# Patient Record
Sex: Female | Born: 1970 | Race: White | Hispanic: No | Marital: Single | Smoking: Never smoker
Health system: Southern US, Community
[De-identification: ages and names within clinical notes are randomized; demographics above are authoritative.]

## PROBLEM LIST (undated history)

## (undated) DIAGNOSIS — G43909 Migraine, unspecified, not intractable, without status migrainosus: Secondary | ICD-10-CM

## (undated) DIAGNOSIS — G5603 Carpal tunnel syndrome, bilateral upper limbs: Secondary | ICD-10-CM

## (undated) DIAGNOSIS — D219 Benign neoplasm of connective and other soft tissue, unspecified: Secondary | ICD-10-CM

## (undated) DIAGNOSIS — L732 Hidradenitis suppurativa: Secondary | ICD-10-CM

## (undated) DIAGNOSIS — N939 Abnormal uterine and vaginal bleeding, unspecified: Secondary | ICD-10-CM

## (undated) DIAGNOSIS — F329 Major depressive disorder, single episode, unspecified: Secondary | ICD-10-CM

## (undated) DIAGNOSIS — R7303 Prediabetes: Secondary | ICD-10-CM

## (undated) DIAGNOSIS — Z889 Allergy status to unspecified drugs, medicaments and biological substances status: Secondary | ICD-10-CM

## (undated) DIAGNOSIS — F209 Schizophrenia, unspecified: Secondary | ICD-10-CM

## (undated) DIAGNOSIS — F32A Depression, unspecified: Secondary | ICD-10-CM

## (undated) DIAGNOSIS — D649 Anemia, unspecified: Secondary | ICD-10-CM

## (undated) DIAGNOSIS — F70 Mild intellectual disabilities: Secondary | ICD-10-CM

## (undated) DIAGNOSIS — C50919 Malignant neoplasm of unspecified site of unspecified female breast: Secondary | ICD-10-CM

## (undated) DIAGNOSIS — I10 Essential (primary) hypertension: Secondary | ICD-10-CM

## (undated) DIAGNOSIS — Z923 Personal history of irradiation: Secondary | ICD-10-CM

## (undated) DIAGNOSIS — F819 Developmental disorder of scholastic skills, unspecified: Secondary | ICD-10-CM

## (undated) DIAGNOSIS — D509 Iron deficiency anemia, unspecified: Secondary | ICD-10-CM

## (undated) DIAGNOSIS — E781 Pure hyperglyceridemia: Secondary | ICD-10-CM

## (undated) DIAGNOSIS — K219 Gastro-esophageal reflux disease without esophagitis: Secondary | ICD-10-CM

## (undated) DIAGNOSIS — N83201 Unspecified ovarian cyst, right side: Secondary | ICD-10-CM

## (undated) DIAGNOSIS — R569 Unspecified convulsions: Secondary | ICD-10-CM

## (undated) DIAGNOSIS — R600 Localized edema: Secondary | ICD-10-CM

## (undated) HISTORY — DX: Migraine, unspecified, not intractable, without status migrainosus: G43.909

## (undated) HISTORY — PX: ABDOMINAL HYSTERECTOMY: SHX81

## (undated) HISTORY — DX: Major depressive disorder, single episode, unspecified: F32.9

## (undated) HISTORY — PX: AXILLARY HIDRADENITIS EXCISION: SUR522

## (undated) HISTORY — PX: INGUINAL HIDRADENITIS EXCISION: SHX1827

## (undated) HISTORY — PX: CARPAL TUNNEL RELEASE: SHX101

## (undated) HISTORY — DX: Depression, unspecified: F32.A

## (undated) HISTORY — PX: CYSTOSCOPY: SUR368

---

## 2004-08-13 ENCOUNTER — Ambulatory Visit: Payer: Self-pay | Admitting: Family Medicine

## 2004-08-15 ENCOUNTER — Ambulatory Visit: Payer: Self-pay | Admitting: Family Medicine

## 2005-08-19 ENCOUNTER — Emergency Department: Payer: Self-pay | Admitting: Emergency Medicine

## 2005-10-08 ENCOUNTER — Encounter (HOSPITAL_BASED_OUTPATIENT_CLINIC_OR_DEPARTMENT_OTHER): Admission: RE | Admit: 2005-10-08 | Discharge: 2006-01-06 | Payer: Self-pay | Admitting: Internal Medicine

## 2010-02-05 ENCOUNTER — Emergency Department: Payer: Self-pay | Admitting: Emergency Medicine

## 2010-10-30 ENCOUNTER — Ambulatory Visit: Payer: Self-pay | Admitting: Family Medicine

## 2011-11-08 ENCOUNTER — Ambulatory Visit: Payer: Self-pay | Admitting: Family Medicine

## 2012-11-10 ENCOUNTER — Ambulatory Visit: Payer: Self-pay | Admitting: Family Medicine

## 2013-11-11 ENCOUNTER — Ambulatory Visit: Payer: Self-pay | Admitting: Family Medicine

## 2014-08-15 ENCOUNTER — Ambulatory Visit: Payer: Self-pay | Admitting: Otolaryngology

## 2014-09-06 DIAGNOSIS — R42 Dizziness and giddiness: Secondary | ICD-10-CM | POA: Insufficient documentation

## 2014-10-17 ENCOUNTER — Other Ambulatory Visit: Payer: Self-pay | Admitting: Family Medicine

## 2014-10-17 DIAGNOSIS — Z1231 Encounter for screening mammogram for malignant neoplasm of breast: Secondary | ICD-10-CM

## 2014-11-15 ENCOUNTER — Ambulatory Visit: Payer: Self-pay

## 2014-11-23 ENCOUNTER — Ambulatory Visit
Admission: RE | Admit: 2014-11-23 | Discharge: 2014-11-23 | Disposition: A | Discharge status: Home / Self Care | Payer: Medicare Other | Source: Ambulatory Visit | Attending: Family Medicine | Admitting: Family Medicine

## 2014-11-23 ENCOUNTER — Other Ambulatory Visit: Payer: Self-pay | Admitting: Family Medicine

## 2014-11-23 DIAGNOSIS — Z1231 Encounter for screening mammogram for malignant neoplasm of breast: Secondary | ICD-10-CM

## 2014-11-23 DIAGNOSIS — N6452 Nipple discharge: Secondary | ICD-10-CM

## 2014-11-28 ENCOUNTER — Ambulatory Visit
Admission: RE | Admit: 2014-11-28 | Discharge: 2014-11-28 | Disposition: A | Discharge status: Home / Self Care | Payer: Medicare Other | Source: Ambulatory Visit | Attending: Family Medicine | Admitting: Family Medicine

## 2014-11-28 ENCOUNTER — Ambulatory Visit: Payer: Medicare Other

## 2014-11-28 DIAGNOSIS — N6452 Nipple discharge: Secondary | ICD-10-CM

## 2014-11-28 DIAGNOSIS — Z1231 Encounter for screening mammogram for malignant neoplasm of breast: Secondary | ICD-10-CM

## 2014-12-02 ENCOUNTER — Other Ambulatory Visit: Payer: Medicare Other

## 2014-12-02 ENCOUNTER — Ambulatory Visit: Payer: Medicare Other

## 2014-12-02 ENCOUNTER — Other Ambulatory Visit: Payer: Self-pay | Admitting: Family Medicine

## 2014-12-02 DIAGNOSIS — N6452 Nipple discharge: Secondary | ICD-10-CM

## 2014-12-06 ENCOUNTER — Other Ambulatory Visit: Payer: Self-pay | Admitting: Family Medicine

## 2014-12-06 DIAGNOSIS — N6452 Nipple discharge: Secondary | ICD-10-CM

## 2014-12-06 DIAGNOSIS — R928 Other abnormal and inconclusive findings on diagnostic imaging of breast: Secondary | ICD-10-CM

## 2014-12-12 ENCOUNTER — Ambulatory Visit
Admission: RE | Admit: 2014-12-12 | Discharge: 2014-12-12 | Disposition: A | Discharge status: Home / Self Care | Payer: Medicare Other | Source: Ambulatory Visit | Attending: Family Medicine | Admitting: Family Medicine

## 2014-12-12 DIAGNOSIS — N6452 Nipple discharge: Secondary | ICD-10-CM

## 2014-12-12 DIAGNOSIS — R928 Other abnormal and inconclusive findings on diagnostic imaging of breast: Secondary | ICD-10-CM

## 2015-01-12 ENCOUNTER — Encounter: Payer: Self-pay | Admitting: General Surgery

## 2015-01-16 DIAGNOSIS — C50919 Malignant neoplasm of unspecified site of unspecified female breast: Secondary | ICD-10-CM

## 2015-01-16 HISTORY — DX: Malignant neoplasm of unspecified site of unspecified female breast: C50.919

## 2015-01-17 ENCOUNTER — Other Ambulatory Visit: Payer: Self-pay | Admitting: General Surgery

## 2015-01-17 ENCOUNTER — Encounter: Payer: Self-pay | Admitting: General Surgery

## 2015-01-17 ENCOUNTER — Ambulatory Visit (INDEPENDENT_AMBULATORY_CARE_PROVIDER_SITE_OTHER): Payer: Medicare Other | Admitting: General Surgery

## 2015-01-17 ENCOUNTER — Other Ambulatory Visit: Payer: Medicare Other

## 2015-01-17 VITALS — BP 122/74 | HR 78 | Resp 12 | Ht 62.0 in | Wt 186.0 lb

## 2015-01-17 DIAGNOSIS — N6452 Nipple discharge: Secondary | ICD-10-CM

## 2015-01-17 DIAGNOSIS — Z803 Family history of malignant neoplasm of breast: Secondary | ICD-10-CM

## 2015-01-17 NOTE — Patient Instructions (Signed)
Patient is scheduled for surgery at [facility] on 01/26/15. She will pre admit by phone. Patient is aware of date and instructions.

## 2015-01-17 NOTE — Progress Notes (Signed)
Patient ID: Karen Beard, female   DOB: Jun 26, 1970, 44 y.o.   MRN: [id number]  Chief Complaint  Patient presents with  . Other    mammogram    HPI Karen Beard is a 44 y.o. female who presents for a breast evaluation. The most recent mammogram and right breast ulrasound was done on 12/12/14. She staes she noticed right nipple discharge about 2 months ago brownish green tint.  Patient does perform regular self breast checks and gets regular mammograms done. Sister just found in November 2015 with breast cancer at age 42, Genetic test was performed on her and was negative.   HPI  Past Medical History  Diagnosis Date  . Depression   . Migraine     Past Surgical History  Procedure Laterality Date  . Axillary hidradenitis excision      unc    Family History  Problem Relation Age of Onset  . Breast cancer Sister 32  . Testicular cancer Father     Social History History  Substance Use Topics  . Smoking status: Never Smoker   . Smokeless tobacco: Not on file  . Alcohol Use: No    Allergies  Allergen Reactions  . Cephalexin Nausea And Vomiting  . Corn Oil Other (See Comments)  . Peanut Oil Other (See Comments)  . Phenobarbital Other (See Comments)  . Sumatriptan Other (See Comments)  . Vilazodone Hcl Nausea Only  . Duloxetine Hcl Anxiety    agitation    Current Outpatient Prescriptions  Medication Sig Dispense Refill  . ranitidine (ZANTAC) 75 MG tablet Take 75 mg by mouth as needed for heartburn.     No current facility-administered medications for this visit.    Review of Systems Review of Systems  Constitutional: Negative.   Respiratory: Negative.   Cardiovascular: Negative.     Blood pressure 122/74, pulse 78, resp. rate 12, height 5\' 2"  (1.575 m), weight 186 lb (84.369 kg), last menstrual period 12/25/2014.  Physical Exam Physical Exam  Constitutional: She is oriented to person, place, and time. She appears well-developed and well-nourished.   Eyes: Conjunctivae are normal. No scleral icterus.  Neck: Neck supple.  Cardiovascular: Normal rate, regular rhythm and normal heart sounds.   Pulmonary/Chest: Effort normal and breath sounds normal. Right breast exhibits nipple discharge. Right breast exhibits no inverted nipple, no mass, no skin change and no tenderness. Left breast exhibits nipple discharge. Left breast exhibits no inverted nipple, no mass, no skin change and no tenderness.  Bilateral reddish brown nipple discharge.  Right side with copious amounts. Appears to be coming through central portion of the nipple with pressure from all areas of subareolar tissue.  Left side drainage is minimal and appears to be coming from the 10-11 oclock position.   Abdominal: Soft. Bowel sounds are normal. There is no tenderness.  Lymphadenopathy:    She has no cervical adenopathy.    She has no axillary adenopathy.  Neurological: She is alert and oriented to person, place, and time.  Skin: Skin is warm and dry.    Data Reviewed Mammogram and R breast US reviewed. Both normal.  L breast US was preformed today. At 12 oclock location subareolar there is an irregularly shaped hypoechoic mass, 8.4 mm in size.   Assessment    Bilateral bloody nipple discharge. Family history of breast cancer. Negative imaging on right. Suspicious area on left on mammogram.     Plan    Discussed excision of bilateral ducts with patient. Procedure and  risks explained in full. Patient is agreeable.  Patient is scheduled for surgery at [facility] on 01/26/15. She will pre admit by phone. Patient is aware of date and instructions.        Yamileth Hayse G 01/17/2015, 3:55 PM

## 2015-01-19 ENCOUNTER — Telehealth: Payer: Self-pay | Admitting: *Deleted

## 2015-01-19 NOTE — Telephone Encounter (Signed)
Notified patient as instructed, patient pleased. Discussed follow-up appointments, patient agrees  

## 2015-01-19 NOTE — Telephone Encounter (Signed)
-----   Message from Christene Lye, MD sent at 01/19/2015  3:57 PM EDT ----- Please let pt pt know the cytology showed no suspicious findingl.

## 2015-01-20 ENCOUNTER — Encounter: Payer: Self-pay | Admitting: *Deleted

## 2015-01-20 ENCOUNTER — Inpatient Hospital Stay: Admission: RE | Admit: 2015-01-20 | Payer: Medicare Other | Source: Ambulatory Visit

## 2015-01-20 DIAGNOSIS — G43909 Migraine, unspecified, not intractable, without status migrainosus: Secondary | ICD-10-CM | POA: Diagnosis not present

## 2015-01-20 DIAGNOSIS — Z888 Allergy status to other drugs, medicaments and biological substances status: Secondary | ICD-10-CM | POA: Diagnosis not present

## 2015-01-20 DIAGNOSIS — I10 Essential (primary) hypertension: Secondary | ICD-10-CM | POA: Diagnosis not present

## 2015-01-20 DIAGNOSIS — D1801 Hemangioma of skin and subcutaneous tissue: Secondary | ICD-10-CM | POA: Diagnosis not present

## 2015-01-20 DIAGNOSIS — K219 Gastro-esophageal reflux disease without esophagitis: Secondary | ICD-10-CM | POA: Diagnosis not present

## 2015-01-20 DIAGNOSIS — N6452 Nipple discharge: Secondary | ICD-10-CM | POA: Diagnosis present

## 2015-01-20 DIAGNOSIS — M7989 Other specified soft tissue disorders: Secondary | ICD-10-CM | POA: Diagnosis not present

## 2015-01-20 DIAGNOSIS — Z91010 Allergy to peanuts: Secondary | ICD-10-CM | POA: Diagnosis not present

## 2015-01-20 DIAGNOSIS — F329 Major depressive disorder, single episode, unspecified: Secondary | ICD-10-CM | POA: Diagnosis not present

## 2015-01-20 DIAGNOSIS — Z79899 Other long term (current) drug therapy: Secondary | ICD-10-CM | POA: Diagnosis not present

## 2015-01-20 DIAGNOSIS — Z803 Family history of malignant neoplasm of breast: Secondary | ICD-10-CM | POA: Diagnosis not present

## 2015-01-20 DIAGNOSIS — N6021 Fibroadenosis of right breast: Secondary | ICD-10-CM | POA: Diagnosis not present

## 2015-01-20 DIAGNOSIS — Z91018 Allergy to other foods: Secondary | ICD-10-CM | POA: Diagnosis not present

## 2015-01-20 DIAGNOSIS — Z8043 Family history of malignant neoplasm of testis: Secondary | ICD-10-CM | POA: Diagnosis not present

## 2015-01-20 DIAGNOSIS — D0511 Intraductal carcinoma in situ of right breast: Secondary | ICD-10-CM | POA: Diagnosis not present

## 2015-01-20 DIAGNOSIS — N6042 Mammary duct ectasia of left breast: Secondary | ICD-10-CM | POA: Diagnosis not present

## 2015-01-20 NOTE — Patient Instructions (Signed)
  Your procedure is scheduled on: 01-26-15 Report to [place] To find out your arrival time please call [phone] between 1PM - 3PM on 01-25-15 [facility])  Remember: Instructions that are not followed completely may result in serious medical risk, up to and including death, or upon the discretion of your surgeon and anesthesiologist your surgery may need to be rescheduled.    _X___ 1. Do not eat food or drink liquids after midnight. No gum chewing or hard candies.     _X___ 2. No Alcohol for 24 hours before or after surgery.   ____ 3. Bring all medications with you on the day of surgery if instructed.    _X___ 4. Notify your doctor if there is any change in your medical condition     (cold, fever, infections).     Do not wear jewelry, make-up, hairpins, clips or nail polish.  Do not wear lotions, powders, or perfumes. You may wear deodorant.  Do not shave 48 hours prior to surgery. Men may shave face and neck.  Do not bring valuables to the hospital.    [facility] is not responsible for any belongings or valuables.               Contacts, dentures or bridgework may not be worn into surgery.  Leave your suitcase in the car. After surgery it may be brought to your room.  For patients admitted to the hospital, discharge time is determined by your  treatment team.   Patients discharged the day of surgery will not be allowed to drive home.   Please read over the following fact sheets that you were given:     ____ Take these medicines the morning of surgery with A SIP OF WATER:    1. NONE  2.   3.   4.  5.  6.  ____ Fleet Enema (as directed)   ____ Use CHG Soap as directed  ____ Use inhalers on the day of surgery  ____ Stop metformin 2 days prior to surgery    ____ Take 1/2 of usual insulin dose the night before surgery and none on the morning of surgery.   _X___ Stop Coumadin/Plavix/aspirin-STOP EXCEDRIN MIGRAINE NOW  ____ Stop  Anti-inflammatories-NO NSAIDS OR ASPIRIN PRODUCTS-TYLENOL OK   ____ Stop supplements until after surgery.    ____ Bring C-Pap to the hospital.

## 2015-01-24 ENCOUNTER — Encounter
Admission: RE | Admit: 2015-01-24 | Discharge: 2015-01-24 | Disposition: A | Discharge status: Home / Self Care | Payer: Medicare Other | Source: Ambulatory Visit | Attending: Anesthesiology | Admitting: Anesthesiology

## 2015-01-24 DIAGNOSIS — D0511 Intraductal carcinoma in situ of right breast: Secondary | ICD-10-CM | POA: Diagnosis not present

## 2015-01-24 DIAGNOSIS — I10 Essential (primary) hypertension: Secondary | ICD-10-CM

## 2015-01-25 ENCOUNTER — Encounter: Payer: Self-pay | Admitting: *Deleted

## 2015-01-26 ENCOUNTER — Encounter
Admission: RE | Disposition: A | Discharge status: Home / Self Care | Payer: Self-pay | Source: Ambulatory Visit | Attending: General Surgery

## 2015-01-26 ENCOUNTER — Ambulatory Visit: Payer: Medicare Other | Admitting: Anesthesiology

## 2015-01-26 ENCOUNTER — Encounter: Payer: Self-pay | Admitting: *Deleted

## 2015-01-26 ENCOUNTER — Ambulatory Visit
Admission: RE | Admit: 2015-01-26 | Discharge: 2015-01-26 | Disposition: A | Discharge status: Home / Self Care | Payer: Medicare Other | Source: Ambulatory Visit | Attending: General Surgery | Admitting: General Surgery

## 2015-01-26 DIAGNOSIS — I10 Essential (primary) hypertension: Secondary | ICD-10-CM | POA: Insufficient documentation

## 2015-01-26 DIAGNOSIS — G43909 Migraine, unspecified, not intractable, without status migrainosus: Secondary | ICD-10-CM | POA: Insufficient documentation

## 2015-01-26 DIAGNOSIS — D0511 Intraductal carcinoma in situ of right breast: Secondary | ICD-10-CM | POA: Insufficient documentation

## 2015-01-26 DIAGNOSIS — F329 Major depressive disorder, single episode, unspecified: Secondary | ICD-10-CM | POA: Insufficient documentation

## 2015-01-26 DIAGNOSIS — Z8043 Family history of malignant neoplasm of testis: Secondary | ICD-10-CM | POA: Insufficient documentation

## 2015-01-26 DIAGNOSIS — N6452 Nipple discharge: Secondary | ICD-10-CM

## 2015-01-26 DIAGNOSIS — N6042 Mammary duct ectasia of left breast: Secondary | ICD-10-CM | POA: Insufficient documentation

## 2015-01-26 DIAGNOSIS — K219 Gastro-esophageal reflux disease without esophagitis: Secondary | ICD-10-CM | POA: Insufficient documentation

## 2015-01-26 DIAGNOSIS — Z803 Family history of malignant neoplasm of breast: Secondary | ICD-10-CM | POA: Insufficient documentation

## 2015-01-26 DIAGNOSIS — Z888 Allergy status to other drugs, medicaments and biological substances status: Secondary | ICD-10-CM | POA: Insufficient documentation

## 2015-01-26 DIAGNOSIS — Z79899 Other long term (current) drug therapy: Secondary | ICD-10-CM | POA: Insufficient documentation

## 2015-01-26 DIAGNOSIS — D1801 Hemangioma of skin and subcutaneous tissue: Secondary | ICD-10-CM | POA: Insufficient documentation

## 2015-01-26 DIAGNOSIS — M7989 Other specified soft tissue disorders: Secondary | ICD-10-CM | POA: Insufficient documentation

## 2015-01-26 DIAGNOSIS — Z91018 Allergy to other foods: Secondary | ICD-10-CM | POA: Insufficient documentation

## 2015-01-26 DIAGNOSIS — Z91010 Allergy to peanuts: Secondary | ICD-10-CM | POA: Insufficient documentation

## 2015-01-26 DIAGNOSIS — N6021 Fibroadenosis of right breast: Secondary | ICD-10-CM | POA: Insufficient documentation

## 2015-01-26 HISTORY — DX: Gastro-esophageal reflux disease without esophagitis: K21.9

## 2015-01-26 HISTORY — DX: Localized edema: R60.0

## 2015-01-26 HISTORY — PX: BREAST DUCTAL SYSTEM EXCISION: SHX5242

## 2015-01-26 HISTORY — DX: Unspecified convulsions: R56.9

## 2015-01-26 HISTORY — DX: Essential (primary) hypertension: I10

## 2015-01-26 HISTORY — PX: EXCISION / BIOPSY BREAST / NIPPLE / DUCT: SUR469

## 2015-01-26 HISTORY — DX: Developmental disorder of scholastic skills, unspecified: F81.9

## 2015-01-26 LAB — HCG, QUANTITATIVE, PREGNANCY: hCG, Beta Chain, Quant, S: 1 m[IU]/mL (ref <5)

## 2015-01-26 SURGERY — EXCISION DUCTAL SYSTEM BREAST
Anesthesia: General | Laterality: Bilateral | Wound class: Clean

## 2015-01-26 MED ORDER — CHLORHEXIDINE GLUCONATE 4 % EX LIQD
1.0000 "application " | Freq: Once | CUTANEOUS | Status: AC
  Administered 2015-01-26: 1 via TOPICAL

## 2015-01-26 MED ORDER — LACTATED RINGERS IV SOLN
INTRAVENOUS | Status: DC
  Administered 2015-01-26 (×2): via INTRAVENOUS

## 2015-01-26 MED ORDER — ONDANSETRON HCL 4 MG/2ML IJ SOLN: 4.0000 mg | Freq: Once | INTRAMUSCULAR | Status: DC | PRN

## 2015-01-26 MED ORDER — LIDOCAINE HCL (CARDIAC) 20 MG/ML IV SOLN
INTRAVENOUS | Status: DC | PRN
  Administered 2015-01-26: 100 mg via INTRAVENOUS

## 2015-01-26 MED ORDER — FAMOTIDINE 20 MG PO TABS
ORAL_TABLET | ORAL | Status: AC
  Administered 2015-01-26: 20 mg via ORAL
  Filled 2015-01-26: qty 1

## 2015-01-26 MED ORDER — FENTANYL CITRATE (PF) 100 MCG/2ML IJ SOLN: 25.0000 ug | INTRAMUSCULAR | Status: DC | PRN

## 2015-01-26 MED ORDER — DEXAMETHASONE SODIUM PHOSPHATE 4 MG/ML IJ SOLN
INTRAMUSCULAR | Status: DC | PRN
  Administered 2015-01-26: 5 mg via INTRAVENOUS

## 2015-01-26 MED ORDER — ONDANSETRON HCL 4 MG/2ML IJ SOLN
INTRAMUSCULAR | Status: DC | PRN
  Administered 2015-01-26: 4 mg via INTRAVENOUS

## 2015-01-26 MED ORDER — BUPIVACAINE HCL (PF) 0.5 % IJ SOLN
INTRAMUSCULAR | Status: AC
  Filled 2015-01-26: qty 30

## 2015-01-26 MED ORDER — PROPOFOL 10 MG/ML IV BOLUS
INTRAVENOUS | Status: DC | PRN
  Administered 2015-01-26: 160 mg via INTRAVENOUS

## 2015-01-26 MED ORDER — BUPIVACAINE HCL (PF) 0.5 % IJ SOLN
INTRAMUSCULAR | Status: DC | PRN
  Administered 2015-01-26: 13 mL

## 2015-01-26 MED ORDER — TRAMADOL HCL 50 MG PO TABS: 50.0000 mg | ORAL_TABLET | Freq: Four times a day (QID) | ORAL | Status: DC | PRN

## 2015-01-26 MED ORDER — ACETAMINOPHEN 10 MG/ML IV SOLN
INTRAVENOUS | Status: AC
  Filled 2015-01-26: qty 100

## 2015-01-26 MED ORDER — BUPIVACAINE HCL (PF) 0.5 % IJ SOLN
INTRAMUSCULAR | Status: DC | PRN
  Administered 2015-01-26: 6 mL

## 2015-01-26 MED ORDER — FAMOTIDINE 20 MG PO TABS
20.0000 mg | ORAL_TABLET | Freq: Once | ORAL | Status: AC
  Administered 2015-01-26: 20 mg via ORAL

## 2015-01-26 MED ORDER — FENTANYL CITRATE (PF) 100 MCG/2ML IJ SOLN
INTRAMUSCULAR | Status: DC | PRN
  Administered 2015-01-26 (×2): 50 ug via INTRAVENOUS

## 2015-01-26 MED ORDER — LIDOCAINE HCL (PF) 1 % IJ SOLN
INTRAMUSCULAR | Status: AC
  Filled 2015-01-26: qty 30

## 2015-01-26 SURGICAL SUPPLY — 34 items
BLADE SURG 15 STRL SS SAFETY (BLADE) ×2 IMPLANT
CANISTER SUCT 1200ML W/VALVE (MISCELLANEOUS) ×2 IMPLANT
CHLORAPREP W/TINT 26ML (MISCELLANEOUS) ×2 IMPLANT
CNTNR SPEC 2.5X3XGRAD LEK (MISCELLANEOUS) ×2
CONT SPEC 4OZ STER OR WHT (MISCELLANEOUS) ×2
CONT SPEC 4OZ STRL OR WHT (MISCELLANEOUS) ×2
CONTAINER SPEC 2.5X3XGRAD LEK (MISCELLANEOUS) ×1 IMPLANT
COVER PROBE FLX POLY STRL (MISCELLANEOUS) ×2 IMPLANT
DEVICE DUBIN SPECIMEN MAMMOGRA (MISCELLANEOUS) ×1 IMPLANT
DEVICE LOCALIZATION ULTRAWIRE (WIRE) IMPLANT
DEVICE ULTRAWIRE LOCAL 19X9 (WIRE) IMPLANT
DRAPE LAPAROTOMY 100X77 ABD (DRAPES) ×2 IMPLANT
GLOVE BIO SURGEON STRL SZ7 (GLOVE) ×1 IMPLANT
GOWN STRL REUS W/ TWL LRG LVL3 (GOWN DISPOSABLE) ×2 IMPLANT
GOWN STRL REUS W/TWL LRG LVL3 (GOWN DISPOSABLE) ×6
KIT RM TURNOVER STRD PROC AR (KITS) ×2 IMPLANT
LABEL OR SOLS (LABEL) ×2 IMPLANT
LIQUID BAND (GAUZE/BANDAGES/DRESSINGS) ×2 IMPLANT
MARGIN MAP 10MM (MISCELLANEOUS) ×2 IMPLANT
NDL HPO THNWL 1X22GA REG BVL (NEEDLE) ×1 IMPLANT
NDL SAFETY 22GX1.5 (NEEDLE) ×2 IMPLANT
NDL SAFETY 25GX1.5 (NEEDLE) ×2 IMPLANT
NEEDLE SAFETY 22GX1 (NEEDLE)
PACK BASIN MINOR ARMC (MISCELLANEOUS) ×2 IMPLANT
PAD GROUND ADULT SPLIT (MISCELLANEOUS) ×2 IMPLANT
SUT ETH BLK MONO 3 0 FS 1 12/B (SUTURE) ×2 IMPLANT
SUT VIC AB 2-0 CT1 (SUTURE) ×3 IMPLANT
SUT VIC AB 3-0 54X BRD REEL (SUTURE) ×1 IMPLANT
SUT VIC AB 3-0 BRD 54 (SUTURE) ×2
SUT VIC AB 4-0 FS2 27 (SUTURE) ×3 IMPLANT
SYR CONTROL 10ML (SYRINGE) ×2 IMPLANT
ULTRAWIRE LOCAL DEVICE 19X9 (WIRE)
ULTRAWIRE LOCALIZATION DEVICE (WIRE)
WATER STERILE IRR 1000ML POUR (IV SOLUTION) ×2 IMPLANT

## 2015-01-26 NOTE — Discharge Instructions (Addendum)

## 2015-01-26 NOTE — Anesthesia Procedure Notes (Signed)
Procedure Name: LMA Insertion Date/Time: 01/26/2015 9:11 AM Performed by: Justus Memory Pre-anesthesia Checklist: Patient identified, Emergency Drugs available, Suction available and Patient being monitored Patient Re-evaluated:Patient Re-evaluated prior to inductionOxygen Delivery Method: Circle system utilized Preoxygenation: Pre-oxygenation with 100% oxygen Intubation Type: IV induction Ventilation: Mask ventilation without difficulty LMA: LMA inserted LMA Size: 3.5 Number of attempts: 1 Placement Confirmation: breath sounds checked- equal and bilateral Dental Injury: Teeth and Oropharynx as per pre-operative assessment

## 2015-01-26 NOTE — H&P (View-Only) (Signed)
Patient ID: Karen Beard, female   DOB: 06-Sep-1970, 44 y.o.   MRN: [id number]  Chief Complaint  Patient presents with  . Other    mammogram    HPI Karen Beard is a 44 y.o. female who presents for a breast evaluation. The most recent mammogram and right breast ulrasound was done on 12/12/14. She staes she noticed right nipple discharge about 2 months ago brownish green tint.  Patient does perform regular self breast checks and gets regular mammograms done. Sister just found in November 2015 with breast cancer at age 66, Genetic test was performed on her and was negative.   HPI  Past Medical History  Diagnosis Date  . Depression   . Migraine     Past Surgical History  Procedure Laterality Date  . Axillary hidradenitis excision      unc    Family History  Problem Relation Age of Onset  . Breast cancer Sister 68  . Testicular cancer Father     Social History History  Substance Use Topics  . Smoking status: Never Smoker   . Smokeless tobacco: Not on file  . Alcohol Use: No    Allergies  Allergen Reactions  . Cephalexin Nausea And Vomiting  . Corn Oil Other (See Comments)  . Peanut Oil Other (See Comments)  . Phenobarbital Other (See Comments)  . Sumatriptan Other (See Comments)  . Vilazodone Hcl Nausea Only  . Duloxetine Hcl Anxiety    agitation    Current Outpatient Prescriptions  Medication Sig Dispense Refill  . ranitidine (ZANTAC) 75 MG tablet Take 75 mg by mouth as needed for heartburn.     No current facility-administered medications for this visit.    Review of Systems Review of Systems  Constitutional: Negative.   Respiratory: Negative.   Cardiovascular: Negative.     Blood pressure 122/74, pulse 78, resp. rate 12, height 5\' 2"  (1.575 m), weight 186 lb (84.369 kg), last menstrual period 12/25/2014.  Physical Exam Physical Exam  Constitutional: She is oriented to person, place, and time. She appears well-developed and well-nourished.   Eyes: Conjunctivae are normal. No scleral icterus.  Neck: Neck supple.  Cardiovascular: Normal rate, regular rhythm and normal heart sounds.   Pulmonary/Chest: Effort normal and breath sounds normal. Right breast exhibits nipple discharge. Right breast exhibits no inverted nipple, no mass, no skin change and no tenderness. Left breast exhibits nipple discharge. Left breast exhibits no inverted nipple, no mass, no skin change and no tenderness.  Bilateral reddish brown nipple discharge.  Right side with copious amounts. Appears to be coming through central portion of the nipple with pressure from all areas of subareolar tissue.  Left side drainage is minimal and appears to be coming from the 10-11 oclock position.   Abdominal: Soft. Bowel sounds are normal. There is no tenderness.  Lymphadenopathy:    She has no cervical adenopathy.    She has no axillary adenopathy.  Neurological: She is alert and oriented to person, place, and time.  Skin: Skin is warm and dry.    Data Reviewed Mammogram and R breast US reviewed. Both normal.  L breast US was preformed today. At 12 oclock location subareolar there is an irregularly shaped hypoechoic mass, 8.4 mm in size.   Assessment    Bilateral bloody nipple discharge. Family history of breast cancer. Negative imaging on right. Suspicious area on left on mammogram.     Plan    Discussed excision of bilateral ducts with patient. Procedure and  risks explained in full. Patient is agreeable.  Patient is scheduled for surgery at [facility] on 01/26/15. She will pre admit by phone. Patient is aware of date and instructions.        Karen Beard 01/17/2015, 3:55 PM

## 2015-01-26 NOTE — Op Note (Signed)
Preop diagnosis: Bilateral bloody nipple discharge  Post op diagnosis: Same  Operation: Bilateral subareolar duct excision  Surgeon: S.G.Circe Chilton  Assistant:     Anesthesia: Gen.  Complications: None  EBL: Minimal  Drains: None  Description: Patient was brought the operating room and put to sleep in supine position the operating table. Both breast areas were prepped and draped sterile field and timeout was performed. Patient had a significantly large amount of from bloody drainage from the right ear nipple which appear to be coming from the central location underneath the nipple. The left side had less of drainage and appear to be coming from an area around 11 to 12:00 location in the subareolar tissue. Right side was operated on first. Half percent Marcaine was instilled total of 6 mL for postop analgesia and a circumareolar incision was made on the inferior aspect from the medial to lateral aspect. Skin was elevated towards the nipple as also the skin and subcutaneous tissue freed on the inferior side ductal tissue containing this of dark bloody fluid was encountered beneath the nipple area- this was freed from the nipple and dissected out as far lateral as possible going more central and slightly inferior. This was excised out in full. Bleeding was controlled cautery. Tissue was sent in formalin for pathology. The deep tissue approximated with interrupted 2-0 Vicryl. Subcutaneous tissue also with the 2-0 Vicryl. Skin was closed with subcuticular 4-0 Vicryl. Left side was operated on now the ultrasound unit was brought up and there was a suspicion of ill-defined small mass in the 11th 12:00 position when this was done in the office setting this was not clearly evident on ultrasound today but there was suggestion of vaguely outlined heart. Mass occupying this location. Accordingly incision was mapped out along the superior aspect of the circumareolar region this incision was then made skin was  elevated as also the subcutaneous tissue superior the nipple area was freed and the ductal tissue containing the dark red fluid was encountered this was then followed distally towards the superior aspect and excised out fully. Bleeding was controlled cautery deep tissues were closed with  2-0 Vicryl. Skin approximated with subcuticular 4-0 Vicryl. Liquid ban applied on both incisions. Patient was subsequently extubated and returned recovery room stable condition

## 2015-01-26 NOTE — Transfer of Care (Signed)
Immediate Anesthesia Transfer of Care Note  Patient: Karen Beard  Procedure(s) Performed: Procedure(s): BILATERAL EXCISION DUCTAL SYSTEM BREAST (Bilateral)  Patient Location: PACU  Anesthesia Type:General  Level of Consciousness: awake, alert  and oriented  Airway & Oxygen Therapy: Patient connected to face mask oxygen  Post-op Assessment: Report given to RN and Post -op Vital signs reviewed and stable  Post vital signs: Reviewed and stable  Last Vitals:  Filed Vitals:   01/26/15 1027  BP: 123/79  Pulse: 86  Temp: 36.9 C  Resp: 13    Complications: No apparent anesthesia complications

## 2015-01-26 NOTE — Interval H&P Note (Signed)
History and Physical Interval Note:  01/26/2015 8:09 AM  Karen Beard  has presented today for surgery, with the diagnosis of bloody nipple discharge  The various methods of treatment have been discussed with the patient and family. After consideration of risks, benefits and other options for treatment, the patient has consented to  Procedure(s): [place] (Bilateral) as a surgical intervention .  The patient's history has been reviewed, patient examined, no change in status, stable for surgery.  I have reviewed the patient's chart and labs.  Questions were answered to the patient's satisfaction.     SANKAR,SEEPLAPUTHUR G

## 2015-01-26 NOTE — Anesthesia Preprocedure Evaluation (Signed)
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    GI/Hepatic Neg liver ROS, GERD-  ,Rare reflux   Endo/Other  negative endocrine ROS  Renal/GU negative Renal ROS  negative genitourinary   Musculoskeletal negative musculoskeletal ROS (+)   Abdominal Normal abdominal exam  (+)   Peds negative pediatric ROS (+) Learning disability   Hematology negative hematology ROS (+)   Anesthesia Other Findings   Reproductive/Obstetrics negative OB ROS                             Anesthesia Physical Anesthesia Plan  ASA: II  Anesthesia Plan: General   Post-op Pain Management:    Induction: Intravenous  Airway Management Planned: LMA  Additional Equipment:   Intra-op Plan:   Post-operative Plan: Extubation in OR  Informed Consent: I have reviewed the patients History and Physical, chart, labs and discussed the procedure including the risks, benefits and alternatives for the proposed anesthesia with the patient or authorized representative who has indicated his/her understanding and acceptance.   Dental advisory given  Plan Discussed with: CRNA and Surgeon  Anesthesia Plan Comments:         Anesthesia Quick Evaluation

## 2015-01-26 NOTE — Anesthesia Postprocedure Evaluation (Signed)
  Anesthesia Post-op Note  Patient: Karen Beard  Procedure(s) Performed: Procedure(s): BILATERAL EXCISION DUCTAL SYSTEM BREAST (Bilateral)  Anesthesia type:General  Patient location: PACU  Post pain: Pain level controlled  Post assessment: Post-op Vital signs reviewed, Patient's Cardiovascular Status Stable, Respiratory Function Stable, Patent Airway and No signs of Nausea or vomiting  Post vital signs: Reviewed and stable  Last Vitals:  Filed Vitals:   01/26/15 1044  BP: 142/88  Pulse: 85  Temp:   Resp: 15    Level of consciousness: awake, alert  and patient cooperative  Complications: No apparent anesthesia complications

## 2015-01-27 DIAGNOSIS — D0511 Intraductal carcinoma in situ of right breast: Secondary | ICD-10-CM | POA: Diagnosis not present

## 2015-01-31 ENCOUNTER — Telehealth: Payer: Self-pay | Admitting: General Surgery

## 2015-01-31 LAB — SURGICAL PATHOLOGY

## 2015-01-31 NOTE — Telephone Encounter (Signed)
PT'S MOTHER CALLED (FRANCES Schloss) TO OBTAIN RESULTS FROM  SX 01-26-15.THEY ARE VERY VERY ANXIOUS. PT HAS A SCH'D APPT  FOR 02-01-15 @ 4:30PM. PLEASE CALL/MTH

## 2015-02-01 ENCOUNTER — Encounter: Payer: Self-pay | Admitting: General Surgery

## 2015-02-01 ENCOUNTER — Ambulatory Visit (INDEPENDENT_AMBULATORY_CARE_PROVIDER_SITE_OTHER): Payer: Medicare Other | Admitting: General Surgery

## 2015-02-01 VITALS — BP 148/88 | HR 82 | Resp 16 | Ht 62.0 in | Wt 185.0 lb

## 2015-02-01 DIAGNOSIS — D0511 Intraductal carcinoma in situ of right breast: Secondary | ICD-10-CM

## 2015-02-01 DIAGNOSIS — N6452 Nipple discharge: Secondary | ICD-10-CM

## 2015-02-01 NOTE — Progress Notes (Signed)
Patient ID: Karen Beard, female   DOB: 1971/01/24, 44 y.o.   MRN: [id number] Patient is here for a post op follow up for a bilateral ductal excision done on 01/26/15. She reports some mild discomfort. She has not had to take any prescription pain medications for this. She reports no drainage.   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 Margins were involved in different areas. Left breast tissue was negative for any malignancy. Discussed fully additional lumpectomy of right breast with removal of nipple areolar complex and subsequent radiation therapy with patient and family. Also gave option of mastectomy in place of lumpectomy/radiation. Patient is agreeable to have a lumpectomy and understands this will involve removal of nipple and areola. ER/PR are pending Patient's surgery has been scheduled for 02-06-15 at [facility].   PCP: [place] Pink

## 2015-02-01 NOTE — Patient Instructions (Addendum)
Patient's surgery has been scheduled for 02-06-15 at [facility].

## 2015-02-06 ENCOUNTER — Encounter
Admission: RE | Disposition: A | Discharge status: Home / Self Care | Payer: Self-pay | Source: Ambulatory Visit | Attending: General Surgery

## 2015-02-06 ENCOUNTER — Ambulatory Visit: Payer: Medicare Other | Admitting: Anesthesiology

## 2015-02-06 ENCOUNTER — Encounter (HOSPITAL_BASED_OUTPATIENT_CLINIC_OR_DEPARTMENT_OTHER): Payer: Medicare Other | Admitting: General Surgery

## 2015-02-06 ENCOUNTER — Encounter: Payer: Self-pay | Admitting: *Deleted

## 2015-02-06 ENCOUNTER — Ambulatory Visit
Admission: RE | Admit: 2015-02-06 | Discharge: 2015-02-06 | Disposition: A | Discharge status: Home / Self Care | Payer: Medicare Other | Source: Ambulatory Visit | Attending: General Surgery | Admitting: General Surgery

## 2015-02-06 DIAGNOSIS — D0511 Intraductal carcinoma in situ of right breast: Secondary | ICD-10-CM | POA: Diagnosis present

## 2015-02-06 DIAGNOSIS — I10 Essential (primary) hypertension: Secondary | ICD-10-CM | POA: Insufficient documentation

## 2015-02-06 DIAGNOSIS — F819 Developmental disorder of scholastic skills, unspecified: Secondary | ICD-10-CM | POA: Insufficient documentation

## 2015-02-06 HISTORY — PX: BREAST LUMPECTOMY: SHX2

## 2015-02-06 LAB — POCT PREGNANCY, URINE: PREG TEST UR: NEGATIVE

## 2015-02-06 SURGERY — BREAST LUMPECTOMY
Anesthesia: General | Laterality: Right | Wound class: Clean

## 2015-02-06 MED ORDER — FAMOTIDINE 20 MG PO TABS
ORAL_TABLET | ORAL | Status: AC
  Administered 2015-02-06: 20 mg via ORAL
  Filled 2015-02-06: qty 1

## 2015-02-06 MED ORDER — ACETAMINOPHEN 10 MG/ML IV SOLN
INTRAVENOUS | Status: DC | PRN
  Administered 2015-02-06: 1000 mg via INTRAVENOUS

## 2015-02-06 MED ORDER — FENTANYL CITRATE (PF) 100 MCG/2ML IJ SOLN
INTRAMUSCULAR | Status: DC | PRN
  Administered 2015-02-06 (×2): 25 ug via INTRAVENOUS
  Administered 2015-02-06: 50 ug via INTRAVENOUS

## 2015-02-06 MED ORDER — ACETAMINOPHEN 10 MG/ML IV SOLN
INTRAVENOUS | Status: AC
  Filled 2015-02-06: qty 100

## 2015-02-06 MED ORDER — KETOROLAC TROMETHAMINE 30 MG/ML IJ SOLN
INTRAMUSCULAR | Status: DC | PRN
  Administered 2015-02-06: 30 mg via INTRAVENOUS

## 2015-02-06 MED ORDER — CIPROFLOXACIN IN D5W 400 MG/200ML IV SOLN
INTRAVENOUS | Status: AC
Start: 2015-02-06 — End: 2015-02-06
  Administered 2015-02-06: 400 mg via INTRAVENOUS
  Filled 2015-02-06: qty 200

## 2015-02-06 MED ORDER — LIDOCAINE HCL (CARDIAC) 20 MG/ML IV SOLN
INTRAVENOUS | Status: DC | PRN
  Administered 2015-02-06: 100 mg via INTRAVENOUS

## 2015-02-06 MED ORDER — SODIUM CHLORIDE 0.9 % IJ SOLN
INTRAMUSCULAR | Status: AC
  Filled 2015-02-06: qty 10

## 2015-02-06 MED ORDER — MIDAZOLAM HCL 2 MG/2ML IJ SOLN
INTRAMUSCULAR | Status: DC | PRN
  Administered 2015-02-06: 1 mg via INTRAVENOUS

## 2015-02-06 MED ORDER — PROMETHAZINE HCL 25 MG/ML IJ SOLN: 6.2500 mg | INTRAMUSCULAR | Status: DC | PRN

## 2015-02-06 MED ORDER — CIPROFLOXACIN IN D5W 400 MG/200ML IV SOLN
400.0000 mg | INTRAVENOUS | Status: AC
  Administered 2015-02-06: 400 mg via INTRAVENOUS

## 2015-02-06 MED ORDER — KETAMINE HCL 50 MG/ML IJ SOLN
INTRAMUSCULAR | Status: DC | PRN
  Administered 2015-02-06: 25 mg via INTRAMUSCULAR

## 2015-02-06 MED ORDER — BUPIVACAINE HCL (PF) 0.5 % IJ SOLN
INTRAMUSCULAR | Status: AC
  Filled 2015-02-06: qty 30

## 2015-02-06 MED ORDER — DEXAMETHASONE SODIUM PHOSPHATE 4 MG/ML IJ SOLN
INTRAMUSCULAR | Status: DC | PRN
  Administered 2015-02-06: 5 mg via INTRAVENOUS

## 2015-02-06 MED ORDER — LACTATED RINGERS IV SOLN
INTRAVENOUS | Status: DC
  Administered 2015-02-06: 09:00:00 via INTRAVENOUS

## 2015-02-06 MED ORDER — GLYCOPYRROLATE 0.2 MG/ML IJ SOLN
INTRAMUSCULAR | Status: DC | PRN
  Administered 2015-02-06: 0.2 mg via INTRAVENOUS

## 2015-02-06 MED ORDER — ONDANSETRON HCL 4 MG/2ML IJ SOLN
INTRAMUSCULAR | Status: DC | PRN
Start: 2015-02-06 — End: 2015-02-06
  Administered 2015-02-06: 4 mg via INTRAVENOUS

## 2015-02-06 MED ORDER — FAMOTIDINE 20 MG PO TABS
20.0000 mg | ORAL_TABLET | Freq: Once | ORAL | Status: AC
  Administered 2015-02-06: 20 mg via ORAL

## 2015-02-06 MED ORDER — METHYLENE BLUE 1 % INJ SOLN
INTRAMUSCULAR | Status: AC
  Filled 2015-02-06: qty 10

## 2015-02-06 MED ORDER — BUPIVACAINE HCL (PF) 0.5 % IJ SOLN
INTRAMUSCULAR | Status: DC | PRN
  Administered 2015-02-06: 10 mL

## 2015-02-06 MED ORDER — PROPOFOL 10 MG/ML IV BOLUS
INTRAVENOUS | Status: DC | PRN
  Administered 2015-02-06: 150 mg via INTRAVENOUS

## 2015-02-06 MED ORDER — FENTANYL CITRATE (PF) 100 MCG/2ML IJ SOLN: 25.0000 ug | INTRAMUSCULAR | Status: DC | PRN

## 2015-02-06 SURGICAL SUPPLY — 32 items
BLADE SURG 15 STRL SS SAFETY (BLADE) ×3 IMPLANT
BULB RESERV EVAC DRAIN JP 100C (MISCELLANEOUS) ×1 IMPLANT
CANISTER SUCT 1200ML W/VALVE (MISCELLANEOUS) ×2 IMPLANT
CHLORAPREP W/TINT 26ML (MISCELLANEOUS) ×2 IMPLANT
CNTNR SPEC 2.5X3XGRAD LEK (MISCELLANEOUS) ×1
CONT SPEC 4OZ STER OR WHT (MISCELLANEOUS) ×1
CONT SPEC 4OZ STRL OR WHT (MISCELLANEOUS) ×1
CONTAINER SPEC 2.5X3XGRAD LEK (MISCELLANEOUS) ×4 IMPLANT
COVER PROBE FLX POLY STRL (MISCELLANEOUS) ×1 IMPLANT
DEVICE LOCALIZATION ULTRAWIRE (WIRE) ×1 IMPLANT
DRAIN CHANNEL JP 15F RND 16 (MISCELLANEOUS) ×1 IMPLANT
DRAPE LAPAROTOMY TRNSV 106X77 (MISCELLANEOUS) ×2 IMPLANT
GLOVE BIO SURGEON STRL SZ7 (GLOVE) ×4 IMPLANT
GOWN STRL REUS W/ TWL LRG LVL3 (GOWN DISPOSABLE) ×3 IMPLANT
GOWN STRL REUS W/TWL LRG LVL3 (GOWN DISPOSABLE) ×4
HARMONIC SCALPEL FOCUS (MISCELLANEOUS) ×1 IMPLANT
KIT RM TURNOVER STRD PROC AR (KITS) ×2 IMPLANT
LABEL OR SOLS (LABEL) ×1 IMPLANT
LIQUID BAND (GAUZE/BANDAGES/DRESSINGS) ×2 IMPLANT
MARGIN MAP 10MM (MISCELLANEOUS) ×1 IMPLANT
NDL SAFETY 22GX1.5 (NEEDLE) ×2 IMPLANT
NDL SAFETY 25GX1.5 (NEEDLE) ×1 IMPLANT
PACK BASIN MINOR ARMC (MISCELLANEOUS) ×2 IMPLANT
PAD GROUND ADULT SPLIT (MISCELLANEOUS) ×2 IMPLANT
STRIP CLOSURE SKIN 1/2X4 (GAUZE/BANDAGES/DRESSINGS) ×1 IMPLANT
SUT ETH BLK MONO 3 0 FS 1 12/B (SUTURE) ×3 IMPLANT
SUT MNCRL AB 3-0 PS2 27 (SUTURE) ×3 IMPLANT
SUT VIC AB 2-0 BRD 54 (SUTURE) ×2 IMPLANT
SUT VIC AB 2-0 CT2 27 (SUTURE) ×3 IMPLANT
SYRINGE 10CC LL (SYRINGE) ×2 IMPLANT
ULTRAWIRE LOCALIZATION DEVICE (WIRE)
WATER STERILE IRR 1000ML POUR (IV SOLUTION) ×2 IMPLANT

## 2015-02-06 NOTE — Transfer of Care (Signed)
Immediate Anesthesia Transfer of Care Note  Patient: Karen Beard  Procedure(s) Performed: Procedure(s): BREAST LUMPECTOMY (Right)  Patient Location: PACU  Anesthesia Type:General  Level of Consciousness: sedated  Airway & Oxygen Therapy: Patient Spontanous Breathing and Patient connected to nasal cannula oxygen  Post-op Assessment: Report given to RN and Post -op Vital signs reviewed and stable  Post vital signs: Reviewed and stable  Last Vitals:  Filed Vitals:   02/06/15 1144  BP: 131/76  Pulse: 98  Temp: 36.2 C  Resp: 15    Complications: No apparent anesthesia complications

## 2015-02-06 NOTE — Discharge Instructions (Signed)

## 2015-02-06 NOTE — Anesthesia Preprocedure Evaluation (Signed)
Anesthesia Evaluation  Patient identified by MRN, date of birth, ID band Patient awake    Reviewed: Allergy & Precautions, NPO status , Patient's Chart, lab work & pertinent test results  Airway Mallampati: II  TM Distance: >3 FB Neck ROM: Full    Dental  (+) Chipped, Poor Dentition   Pulmonary neg pulmonary ROS,  breath sounds clear to auscultation  Pulmonary exam normal       Cardiovascular hypertension, Normal cardiovascular exam Leg swelling   Neuro/Psych  Headaches, Seizures -,  No seizures since childhood    GI/Hepatic Neg liver ROS, GERD-  ,Rare reflux   Endo/Other  negative endocrine ROS  Renal/GU negative Renal ROS  negative genitourinary   Musculoskeletal negative musculoskeletal ROS (+)   Abdominal Normal abdominal exam  (+)   Peds negative pediatric ROS (+) Learning disability   Hematology negative hematology ROS (+)   Anesthesia Other Findings   Reproductive/Obstetrics negative OB ROS                             Anesthesia Physical  Anesthesia Plan  ASA: II  Anesthesia Plan: General   Post-op Pain Management:    Induction: Intravenous  Airway Management Planned: LMA  Additional Equipment:   Intra-op Plan:   Post-operative Plan: Extubation in OR  Informed Consent: I have reviewed the patients History and Physical, chart, labs and discussed the procedure including the risks, benefits and alternatives for the proposed anesthesia with the patient or authorized representative who has indicated his/her understanding and acceptance.   Dental advisory given  Plan Discussed with: CRNA and Surgeon  Anesthesia Plan Comments:         Anesthesia Quick Evaluation

## 2015-02-06 NOTE — Interval H&P Note (Signed)
History and Physical Interval Note:  02/06/2015 10:30 AM  Karen Beard  has presented today for surgery, with the diagnosis of DCIS RIGHT BREAST  The various methods of treatment have been discussed with the patient and family. After consideration of risks, benefits and other options for treatment, the patient has consented to  Procedure(s): BREAST LUMPECTOMY (Right) as a surgical intervention .  The patient's history has been reviewed, patient examined, no change in status, stable for surgery.  I have reviewed the patient's chart and labs.  Questions were answered to the patient's satisfaction.     SANKAR,SEEPLAPUTHUR G

## 2015-02-06 NOTE — Anesthesia Procedure Notes (Signed)
Procedure Name: LMA Insertion Date/Time: 02/06/2015 10:54 AM Performed by: Rosaria Ferries, Kiondre Grenz Pre-anesthesia Checklist: Patient identified, Emergency Drugs available, Suction available and Patient being monitored Patient Re-evaluated:Patient Re-evaluated prior to inductionOxygen Delivery Method: Circle system utilized Preoxygenation: Pre-oxygenation with 100% oxygen Intubation Type: IV induction LMA: LMA inserted LMA Size: 4.0 Number of attempts: 1 Placement Confirmation: breath sounds checked- equal and bilateral

## 2015-02-06 NOTE — Op Note (Signed)
Preop diagnosis: DCIS right breast  Post op diagnosis: Same  Operation: Right breast lumpectomy  Surgeon: S.G.Sankar  Assistant:     Anesthesia: Gen.  Complications: None  EBL: Minimal  Drains: None  Description: Patient was put to sleep in supine position the operating table in the right breast was prepped and draped as sterile field. Timeout was performed. This patient had the recent the subareolar duct excision for bloody nipple drainage. Pathology revealed DCIS fairly extensive involving the margins. Patient also had the small amount of nipple drainage on the left and biopsy there was also completed showing no evidence of DCIS. Patient had elected for a lumpectomy which   necessitated removal of the nipple areolar complex. Accordingly an elliptical incision was made surrounding the nipple the nipple areolar complex in a transverse orientation the skin and subcutaneous tissue with an elevated on both sides. The subareolar tissue along with the nipple area complex overlying this was then excised out completely allowing for some additional margin beyond the previous excision cavity -the excised tissue was tagged for margins and sent to pathology. Hemostasis was obtained easily with cautery. The glandular tissue and the subcutaneous tissue was then closed with interrupted 2-0 Vicryl. Skin was closed with subcuticular 3-0 Monocryl and then covered with the liqui  ban. About 20 mL of half percent Marcaine was instilled for postop analgesia prior to closure of the wound.

## 2015-02-06 NOTE — H&P (View-Only) (Signed)
Patient ID: Karen Beard, female   DOB: 1971-05-14, 44 y.o.   MRN: [id number] Patient is here for a post op follow up for a bilateral ductal excision done on 01/26/15. She reports some mild discomfort. She has not had to take any prescription pain medications for this. She reports no drainage.   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 Margins were involved in different areas. Left breast tissue was negative for any malignancy. Discussed fully additional lumpectomy of right breast with removal of nipple areolar complex and subsequent radiation therapy with patient and family. Also gave option of mastectomy in place of lumpectomy/radiation. Patient is agreeable to have a lumpectomy and understands this will involve removal of nipple and areola. ER/PR are pending Patient's surgery has been scheduled for 02-06-15 at [facility].   PCP: [place] Pink

## 2015-02-08 NOTE — Progress Notes (Signed)
Patient ID: Karen Beard, female   DOB: [birth year], 44 y.o.   MRN: [id number] Phoned patient to introduce navigation service.  Fed-Exed Breast Cancer Treatment Handbook/folder with hospital services.  Patient has post surgical visit with Dr. Jamal Collin 02/14/15.  States he is planning to refer to radiation Oncologist.  Plan to meet at that visit. Oncology Nurse Navigator Documentation  Oncology Nurse Navigator Flowsheets 02/08/2015  Navigator Encounter Type Introductory phone call  Patient Visit Type Initial  Treatment Phase Other  Barriers/Navigation Needs Family concerns  Time Spent with Patient 15

## 2015-02-09 NOTE — Anesthesia Postprocedure Evaluation (Signed)
  Anesthesia Post-op Note  Patient: Karen Beard  Procedure(s) Performed: Procedure(s): BREAST LUMPECTOMY (Right)  Anesthesia type:General  Patient location: PACU  Post pain: Pain level controlled  Post assessment: Post-op Vital signs reviewed, Patient's Cardiovascular Status Stable, Respiratory Function Stable, Patent Airway and No signs of Nausea or vomiting  Post vital signs: Reviewed and stable  Last Vitals:  Filed Vitals:   02/06/15 1322  BP: 142/80  Pulse:   Temp: 36.2 C  Resp: 16    Level of consciousness: awake, alert  and patient cooperative  Complications: No apparent anesthesia complications

## 2015-02-14 ENCOUNTER — Encounter: Payer: Self-pay | Admitting: General Surgery

## 2015-02-14 ENCOUNTER — Ambulatory Visit (INDEPENDENT_AMBULATORY_CARE_PROVIDER_SITE_OTHER): Payer: Medicare Other | Admitting: General Surgery

## 2015-02-14 VITALS — BP 120/78 | HR 68 | Resp 14 | Ht 62.0 in | Wt 185.0 lb

## 2015-02-14 DIAGNOSIS — C50911 Malignant neoplasm of unspecified site of right female breast: Secondary | ICD-10-CM

## 2015-02-14 DIAGNOSIS — D0511 Intraductal carcinoma in situ of right breast: Secondary | ICD-10-CM

## 2015-02-14 NOTE — Progress Notes (Signed)
Patient ID: Karen Beard, female   DOB: 09/30/1970, 44 y.o.   MRN: [id number]  Chief Complaint  Patient presents with  . Follow-up    Right Breast Lumpectomy    HPI Karen Beard is a 44 y.o. female.  Patient is here for 1 week follow up for her right breast lumpectomy done on 02/06/2015. Patient stated that she has not had any pain but has had some swelling on her bilateral legs for 1 week. She is here today with her mother.  HPI  Past Medical History  Diagnosis Date  . Depression   . Migraine   . GERD (gastroesophageal reflux disease)   . Seizures     AS A CHILD-NONE SINCE  . Edema of both legs   . Learning disability   . Hypertension     RECENTLY TAKEN OFF OF MEDS PER MOM   . Cancer 01-26-15    INVASIVE MAMMARY CARCINOMA and DCIS    Past Surgical History  Procedure Laterality Date  . Axillary hidradenitis excision      unc  . Inguinal hidradenitis excision    . Carpal tunnel release    . Breast ductal system excision Bilateral 01/26/2015    Procedure: BILATERAL EXCISION DUCTAL SYSTEM BREAST;  Surgeon: Seeplaputhur G Sankar, MD;  Location: [facility] ORS;  Service: General;  Laterality: Bilateral;  . Breast lumpectomy Right 02/06/2015    Procedure: BREAST LUMPECTOMY;  Surgeon: Seeplaputhur G Sankar, MD;  Location: [facility] ORS;  Service: General;  Laterality: Right;    Family History  Problem Relation Age of Onset  . Breast cancer Sister 50  . Testicular cancer Father     Social History Social History  Substance Use Topics  . Smoking status: Never Smoker   . Smokeless tobacco: Never Used  . Alcohol Use: No    Allergies  Allergen Reactions  . Cephalexin Nausea And Vomiting  . Chocolate Swelling  . Corn Oil Other (See Comments)  . Peanut Oil Other (See Comments)  . Phenobarbital Other (See Comments)  . Sumatriptan Other (See Comments)  . Vilazodone Hcl Nausea Only  . Duloxetine Hcl Anxiety    agitation  . Tape Rash    Current Outpatient Prescriptions   Medication Sig Dispense Refill  . acetaminophen (TYLENOL) 500 MG tablet Take 500 mg by mouth as needed.    . aspirin-acetaminophen-caffeine (EXCEDRIN MIGRAINE) 250-250-65 MG per tablet Take 1 tablet by mouth every 6 (six) hours as needed for headache.    . aspirin-sod bicarb-citric acid (ALKA-SELTZER) 325 MG TBEF tablet Take 325 mg by mouth every 6 (six) hours as needed.    . calcium carbonate (TUMS - DOSED IN MG ELEMENTAL CALCIUM) 500 MG chewable tablet Chew 1 tablet by mouth daily.    . cetirizine (ZYRTEC) 10 MG tablet Take 10 mg by mouth daily as needed for allergies.    . ranitidine (ZANTAC) 75 MG tablet Take 75 mg by mouth as needed for heartburn.     No current facility-administered medications for this visit.    Review of Systems Review of Systems  Constitutional: Negative.   Respiratory: Negative.   Cardiovascular: Positive for leg swelling.    Blood pressure 120/78, pulse 68, resp. rate 14, height 5' 2" (1.575 m), weight 185 lb (83.915 kg), last menstrual period 01/18/2015.  Physical Exam Physical Exam  Constitutional: She is oriented to person, place, and time. She appears well-developed and well-nourished.  HENT:  Mouth/Throat: Oropharynx is clear and moist.  Eyes: Conjunctivae are normal. No   scleral icterus.  Neck: Neck supple.  Cardiovascular: Normal rate, regular rhythm and normal heart sounds.   Bilateral lower leg edema.  Pulmonary/Chest: Effort normal and breath sounds normal.  Incision healing right breast with bruising.   Abdominal: Soft.  Lymphadenopathy:    She has no cervical adenopathy.  Neurological: She is alert and oriented to person, place, and time.  Skin: Skin is warm and dry.  Psychiatric: Her behavior is normal.    Data Reviewed Progress notes and pathology- margins are clear bu there was a 85m focus of invasive CA. 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 Leg edema is not  new. No labs done in last 5 mos.    Plan   Pt was advised fully on pathology. Feel a sentinel node biopsy is indicated. Reasons, potential change in extent of treatment if node is involved were discussed fully. Discussed risk and benefits for right SN biopsy. Pt is agreeable. CBC  CA 27-29 MEt C    [facility]   PCP: JMaryland Pink MD   SJunie PanningG 02/14/2015, 4:04 PM

## 2015-02-14 NOTE — Patient Instructions (Addendum)
The patient is aware to call back for any questions or concerns.  Patient's surgery has been scheduled for 02-23-15 at [facility].

## 2015-02-15 ENCOUNTER — Encounter: Payer: Self-pay | Admitting: *Deleted

## 2015-02-15 ENCOUNTER — Other Ambulatory Visit: Payer: Self-pay | Admitting: *Deleted

## 2015-02-15 DIAGNOSIS — D0511 Intraductal carcinoma in situ of right breast: Secondary | ICD-10-CM

## 2015-02-15 LAB — COMPREHENSIVE METABOLIC PANEL
A/G RATIO: 1.8 (ref 1.1–2.5)
ALT: 7 IU/L (ref 0–32)
AST: 13 IU/L (ref 0–40)
Albumin: 3.5 g/dL (ref 3.5–5.5)
Alkaline Phosphatase: 50 IU/L (ref 39–117)
BUN/Creatinine Ratio: 17 (ref 9–23)
BUN: 13 mg/dL (ref 6–24)
Bilirubin Total: 0.4 mg/dL (ref 0.0–1.2)
CALCIUM: 8.3 mg/dL — AB (ref 8.7–10.2)
CO2: 23 mmol/L (ref 18–29)
Chloride: 104 mmol/L (ref 97–108)
Creatinine, Ser: 0.75 mg/dL (ref 0.57–1.00)
GFR, EST AFRICAN AMERICAN: 111 mL/min/{1.73_m2} (ref >59)
GFR, EST NON AFRICAN AMERICAN: 97 mL/min/{1.73_m2} (ref >59)
GLOBULIN, TOTAL: 1.9 g/dL (ref 1.5–4.5)
Glucose: 126 mg/dL — ABNORMAL HIGH (ref 65–99)
POTASSIUM: 4.3 mmol/L (ref 3.5–5.2)
SODIUM: 142 mmol/L (ref 134–144)
Total Protein: 5.4 g/dL — ABNORMAL LOW (ref 6.0–8.5)

## 2015-02-15 LAB — CBC WITH DIFFERENTIAL/PLATELET
BASOS: 1 %
Basophils Absolute: 0.1 10*3/uL (ref 0.0–0.2)
EOS (ABSOLUTE): 1.6 10*3/uL — ABNORMAL HIGH (ref 0.0–0.4)
EOS: 20 %
HEMATOCRIT: 28.4 % — AB (ref 34.0–46.6)
Hemoglobin: 8.5 g/dL — ABNORMAL LOW (ref 11.1–15.9)
IMMATURE GRANULOCYTES: 0 %
Immature Grans (Abs): 0 10*3/uL (ref 0.0–0.1)
LYMPHS ABS: 1.4 10*3/uL (ref 0.7–3.1)
Lymphs: 17 %
MCH: 20.4 pg — ABNORMAL LOW (ref 26.6–33.0)
MCHC: 29.9 g/dL — ABNORMAL LOW (ref 31.5–35.7)
MCV: 68 fL — AB (ref 79–97)
MONOS ABS: 0.6 10*3/uL (ref 0.1–0.9)
Monocytes: 7 %
NEUTROS ABS: 4.6 10*3/uL (ref 1.4–7.0)
NEUTROS PCT: 55 %
Platelets: 383 10*3/uL — ABNORMAL HIGH (ref 150–379)
RBC: 4.17 x10E6/uL (ref 3.77–5.28)
RDW: 16.2 % — AB (ref 12.3–15.4)
WBC: 8.2 10*3/uL (ref 3.4–10.8)

## 2015-02-15 LAB — CANCER ANTIGEN 27.29: CA 27.29: 33.8 U/mL (ref 0.0–38.6)

## 2015-02-15 NOTE — Progress Notes (Signed)
Patient ID: Karen Beard, female   DOB: [birth year], 44 y.o.   MRN: [id number]  Patient's surgery has been scheduled for 02-23-15 at [facility]. She will report to the radiology desk at 7:30 am day of surgery (per Anderson Malta in Scheduling).   Please note: This patient reports her last menstrual period was 02-14-15. The Scheduling Department has been provided with this updated information today.

## 2015-02-17 ENCOUNTER — Other Ambulatory Visit: Payer: Medicare Other

## 2015-02-17 NOTE — Patient Instructions (Signed)
  Your procedure is scheduled on: 02-23-15 Report to [place] 7:30 AM    Remember: Instructions that are not followed completely may result in serious medical risk, up to and including death, or upon the discretion of your surgeon and anesthesiologist your surgery may need to be rescheduled.    _X___ 1. Do not eat food or drink liquids after midnight. No gum chewing or hard candies.     ____ 2. No Alcohol for 24 hours before or after surgery.   ____ 3. Bring all medications with you on the day of surgery if instructed.    ____ 4. Notify your doctor if there is any change in your medical condition     (cold, fever, infections).     Do not wear jewelry, make-up, hairpins, clips or nail polish.  Do not wear lotions, powders, or perfumes. You may wear deodorant.  Do not shave 48 hours prior to surgery. Men may shave face and neck.  Do not bring valuables to the hospital.    [facility] is not responsible for any belongings or valuables.               Contacts, dentures or bridgework may not be worn into surgery.  Leave your suitcase in the car. After surgery it may be brought to your room.  For patients admitted to the hospital, discharge time is determined by your treatment team.   Patients discharged the day of surgery will not be allowed to drive home.   Please read over the following fact sheets that you were given:     ____ Take these medicines the morning of surgery with A SIP OF WATER:    1. NONE  2.   3.   4.  5.  6.  ____ Fleet Enema (as directed)   ____ Use CHG Soap as directed  ____ Use inhalers on the day of surgery  ____ Stop metformin 2 days prior to surgery    ____ Take 1/2 of usual insulin dose the night before surgery and none on the morning of surgery.   ____ Stop Coumadin/Plavix/aspirin-STOP ALKA-SELTZER NOW  ____ Stop Anti-inflammatories-NO NSAIDS OR ASA PRODUCTS-TYLENOL OK   ____ Stop supplements until after surgery.    ____  Bring C-Pap to the hospital.

## 2015-02-17 NOTE — Pre-Procedure Instructions (Signed)
CALLED CAROLYN AT DR Jacalyn Lefevre OFFICE TO MAKE SURE DR Jamal Collin WAS AWARE OF 8.5 HGB THAT HE HAD DRAWN ON 02-14-15. I TOLD HER I HAD DONE PHONE INTERIVEW WITH PTS MOM AND HER MOM SAID HOW EXTREMELY TIRED PT WAS. MESSAGE TO PASSED ALONG TO DR Jamal Collin.

## 2015-02-21 ENCOUNTER — Other Ambulatory Visit: Payer: Self-pay | Admitting: Adult Health

## 2015-02-21 DIAGNOSIS — M7989 Other specified soft tissue disorders: Secondary | ICD-10-CM

## 2015-02-23 ENCOUNTER — Ambulatory Visit
Admission: RE | Admit: 2015-02-23 | Discharge: 2015-02-23 | Disposition: A | Discharge status: Home / Self Care | Payer: Medicare Other | Source: Ambulatory Visit | Attending: General Surgery | Admitting: General Surgery

## 2015-02-23 ENCOUNTER — Ambulatory Visit: Payer: Medicare Other

## 2015-02-23 ENCOUNTER — Encounter (HOSPITAL_BASED_OUTPATIENT_CLINIC_OR_DEPARTMENT_OTHER): Payer: Medicare Other | Admitting: General Surgery

## 2015-02-23 ENCOUNTER — Encounter
Admission: RE | Disposition: A | Discharge status: Home / Self Care | Payer: Self-pay | Source: Ambulatory Visit | Attending: General Surgery

## 2015-02-23 ENCOUNTER — Encounter: Payer: Self-pay | Admitting: *Deleted

## 2015-02-23 ENCOUNTER — Ambulatory Visit: Payer: Medicare Other | Admitting: Anesthesiology

## 2015-02-23 DIAGNOSIS — F819 Developmental disorder of scholastic skills, unspecified: Secondary | ICD-10-CM | POA: Diagnosis not present

## 2015-02-23 DIAGNOSIS — Z888 Allergy status to other drugs, medicaments and biological substances status: Secondary | ICD-10-CM | POA: Insufficient documentation

## 2015-02-23 DIAGNOSIS — F329 Major depressive disorder, single episode, unspecified: Secondary | ICD-10-CM | POA: Insufficient documentation

## 2015-02-23 DIAGNOSIS — Z803 Family history of malignant neoplasm of breast: Secondary | ICD-10-CM | POA: Insufficient documentation

## 2015-02-23 DIAGNOSIS — G43909 Migraine, unspecified, not intractable, without status migrainosus: Secondary | ICD-10-CM | POA: Insufficient documentation

## 2015-02-23 DIAGNOSIS — Z91048 Other nonmedicinal substance allergy status: Secondary | ICD-10-CM | POA: Diagnosis not present

## 2015-02-23 DIAGNOSIS — Z7982 Long term (current) use of aspirin: Secondary | ICD-10-CM | POA: Diagnosis not present

## 2015-02-23 DIAGNOSIS — Z79899 Other long term (current) drug therapy: Secondary | ICD-10-CM | POA: Diagnosis not present

## 2015-02-23 DIAGNOSIS — K219 Gastro-esophageal reflux disease without esophagitis: Secondary | ICD-10-CM | POA: Insufficient documentation

## 2015-02-23 DIAGNOSIS — Z91018 Allergy to other foods: Secondary | ICD-10-CM | POA: Diagnosis not present

## 2015-02-23 DIAGNOSIS — Z170 Estrogen receptor positive status [ER+]: Secondary | ICD-10-CM | POA: Insufficient documentation

## 2015-02-23 DIAGNOSIS — C50111 Malignant neoplasm of central portion of right female breast: Secondary | ICD-10-CM | POA: Insufficient documentation

## 2015-02-23 DIAGNOSIS — Z8043 Family history of malignant neoplasm of testis: Secondary | ICD-10-CM | POA: Diagnosis not present

## 2015-02-23 DIAGNOSIS — C50911 Malignant neoplasm of unspecified site of right female breast: Secondary | ICD-10-CM | POA: Diagnosis not present

## 2015-02-23 DIAGNOSIS — I10 Essential (primary) hypertension: Secondary | ICD-10-CM | POA: Insufficient documentation

## 2015-02-23 DIAGNOSIS — R609 Edema, unspecified: Secondary | ICD-10-CM | POA: Insufficient documentation

## 2015-02-23 DIAGNOSIS — D0511 Intraductal carcinoma in situ of right breast: Secondary | ICD-10-CM

## 2015-02-23 HISTORY — PX: AXILLARY SENTINEL NODE BIOPSY: SHX5738

## 2015-02-23 LAB — POCT PREGNANCY, URINE: PREG TEST UR: NEGATIVE

## 2015-02-23 SURGERY — BIOPSY, LYMPH NODE, SENTINEL, AXILLARY
Anesthesia: General | Laterality: Right | Wound class: Clean

## 2015-02-23 MED ORDER — ONDANSETRON HCL 4 MG/2ML IJ SOLN: 4.0000 mg | Freq: Once | INTRAMUSCULAR | Status: DC | PRN

## 2015-02-23 MED ORDER — METHYLENE BLUE 1 % INJ SOLN
INTRAMUSCULAR | Status: AC
  Filled 2015-02-23: qty 10

## 2015-02-23 MED ORDER — NEOSTIGMINE METHYLSULFATE 10 MG/10ML IV SOLN
INTRAVENOUS | Status: DC | PRN
  Administered 2015-02-23: 3.5 mg via INTRAVENOUS

## 2015-02-23 MED ORDER — CEFAZOLIN SODIUM-DEXTROSE 2-3 GM-% IV SOLR
2.0000 g | INTRAVENOUS | Status: AC
  Administered 2015-02-23: 2 g via INTRAVENOUS

## 2015-02-23 MED ORDER — ACETAMINOPHEN 10 MG/ML IV SOLN
INTRAVENOUS | Status: AC
  Filled 2015-02-23: qty 100

## 2015-02-23 MED ORDER — DEXAMETHASONE SODIUM PHOSPHATE 4 MG/ML IJ SOLN
INTRAMUSCULAR | Status: DC | PRN
  Administered 2015-02-23: 5 mg via INTRAVENOUS

## 2015-02-23 MED ORDER — PROPOFOL 10 MG/ML IV BOLUS
INTRAVENOUS | Status: DC | PRN
  Administered 2015-02-23: 150 mg via INTRAVENOUS

## 2015-02-23 MED ORDER — LACTATED RINGERS IV SOLN
INTRAVENOUS | Status: DC
  Administered 2015-02-23 (×2): via INTRAVENOUS

## 2015-02-23 MED ORDER — LIDOCAINE HCL (CARDIAC) 20 MG/ML IV SOLN
INTRAVENOUS | Status: DC | PRN
  Administered 2015-02-23: 60 mg via INTRAVENOUS

## 2015-02-23 MED ORDER — TECHNETIUM TC 99M SULFUR COLLOID
1.0000 | Freq: Once | INTRAVENOUS | Status: DC | PRN
  Administered 2015-02-23: 0.949 via INTRAVENOUS
  Filled 2015-02-23: qty 1

## 2015-02-23 MED ORDER — FAMOTIDINE 20 MG PO TABS
ORAL_TABLET | ORAL | Status: AC
  Administered 2015-02-23: 20 mg via ORAL
  Filled 2015-02-23: qty 1

## 2015-02-23 MED ORDER — KETOROLAC TROMETHAMINE 30 MG/ML IJ SOLN
INTRAMUSCULAR | Status: DC | PRN
  Administered 2015-02-23: 30 mg via INTRAVENOUS

## 2015-02-23 MED ORDER — ONDANSETRON HCL 4 MG/2ML IJ SOLN
INTRAMUSCULAR | Status: DC | PRN
  Administered 2015-02-23: 4 mg via INTRAVENOUS

## 2015-02-23 MED ORDER — MIDAZOLAM HCL 2 MG/2ML IJ SOLN
INTRAMUSCULAR | Status: DC | PRN
  Administered 2015-02-23: 2 mg via INTRAVENOUS

## 2015-02-23 MED ORDER — FENTANYL CITRATE (PF) 100 MCG/2ML IJ SOLN: 25.0000 ug | INTRAMUSCULAR | Status: DC | PRN

## 2015-02-23 MED ORDER — SODIUM CHLORIDE 0.9 % IJ SOLN
INTRAMUSCULAR | Status: AC
  Filled 2015-02-23: qty 10

## 2015-02-23 MED ORDER — FAMOTIDINE 20 MG PO TABS
20.0000 mg | ORAL_TABLET | Freq: Once | ORAL | Status: AC
  Administered 2015-02-23: 20 mg via ORAL

## 2015-02-23 MED ORDER — FENTANYL CITRATE (PF) 100 MCG/2ML IJ SOLN
INTRAMUSCULAR | Status: DC | PRN
  Administered 2015-02-23: 50 ug via INTRAVENOUS

## 2015-02-23 MED ORDER — ROCURONIUM BROMIDE 100 MG/10ML IV SOLN
INTRAVENOUS | Status: DC | PRN
  Administered 2015-02-23: 40 mg via INTRAVENOUS

## 2015-02-23 MED ORDER — GLYCOPYRROLATE 0.2 MG/ML IJ SOLN
INTRAMUSCULAR | Status: DC | PRN
  Administered 2015-02-23: 0.6 mg via INTRAVENOUS

## 2015-02-23 MED ORDER — CEFAZOLIN SODIUM-DEXTROSE 2-3 GM-% IV SOLR
INTRAVENOUS | Status: AC
  Administered 2015-02-23: 2 g via INTRAVENOUS
  Filled 2015-02-23: qty 50

## 2015-02-23 MED ORDER — ACETAMINOPHEN 10 MG/ML IV SOLN
INTRAVENOUS | Status: DC | PRN
  Administered 2015-02-23: 1000 mg via INTRAVENOUS

## 2015-02-23 MED ORDER — SODIUM CHLORIDE 0.9 % IJ SOLN
INTRAMUSCULAR | Status: DC | PRN
  Administered 2015-02-23: 5 mL via INTRAMUSCULAR

## 2015-02-23 MED ORDER — CHLORHEXIDINE GLUCONATE 4 % EX LIQD: 1.0000 "application " | Freq: Once | CUTANEOUS | Status: DC

## 2015-02-23 SURGICAL SUPPLY — 34 items
APPLIER CLIP 11 MED OPEN (CLIP)
APR CLP MED 11 20 MLT OPN (CLIP)
BLADE SURG 15 STRL SS SAFETY (BLADE) ×2 IMPLANT
BULB RESERV EVAC DRAIN JP 100C (MISCELLANEOUS) ×1 IMPLANT
CANISTER SUCT 1200ML W/VALVE (MISCELLANEOUS) ×2 IMPLANT
CHLORAPREP W/TINT 26ML (MISCELLANEOUS) ×2 IMPLANT
CLIP APPLIE 11 MED OPEN (CLIP) ×1 IMPLANT
CNTNR SPEC C3OZ STD GRAD LEK (MISCELLANEOUS) IMPLANT
CONT SPEC 3OZ W/LID STRL (MISCELLANEOUS) ×4
DRAIN CHANNEL JP 15F RND 16 (MISCELLANEOUS) ×1 IMPLANT
GLOVE BIO SURGEON STRL SZ7 (GLOVE) ×2 IMPLANT
GOWN STRL REUS W/ TWL LRG LVL3 (GOWN DISPOSABLE) ×2 IMPLANT
GOWN STRL REUS W/TWL LRG LVL3 (GOWN DISPOSABLE) ×4
KIT RM TURNOVER STRD PROC AR (KITS) ×2 IMPLANT
LABEL OR SOLS (LABEL) ×1 IMPLANT
LIQUID BAND (GAUZE/BANDAGES/DRESSINGS) ×2 IMPLANT
MARGIN MAP 10MM (MISCELLANEOUS) ×1 IMPLANT
NDL HPO THNWL 1X22GA REG BVL (NEEDLE) ×1 IMPLANT
NDL HYPO 25X1 1.5 SAFETY (NEEDLE) ×1 IMPLANT
NEEDLE HYPO 25X1 1.5 SAFETY (NEEDLE) ×2 IMPLANT
NEEDLE SAFETY 22GX1 (NEEDLE) ×2
PACK BASIN MINOR ARMC (MISCELLANEOUS) ×2 IMPLANT
PAD GROUND ADULT SPLIT (MISCELLANEOUS) ×2 IMPLANT
SLEVE PROBE SENORX GAMMA FIND (MISCELLANEOUS) ×2 IMPLANT
SPONGE LAP 18X18 5 PK (GAUZE/BANDAGES/DRESSINGS) ×2 IMPLANT
SUT MNCRL AB 3-0 PS2 27 (SUTURE) ×1 IMPLANT
SUT VIC AB 2-0 SH 27 (SUTURE) ×2
SUT VIC AB 2-0 SH 27XBRD (SUTURE) ×1 IMPLANT
SUT VIC AB 3-0 54X BRD REEL (SUTURE) ×1 IMPLANT
SUT VIC AB 3-0 BRD 54 (SUTURE) ×2
SUT VIC AB 3-0 SH 27 (SUTURE) ×2
SUT VIC AB 3-0 SH 27X BRD (SUTURE) ×1 IMPLANT
SYR CONTROL 10ML (SYRINGE) ×1 IMPLANT
WATER STERILE IRR 1000ML POUR (IV SOLUTION) ×2 IMPLANT

## 2015-02-23 NOTE — Anesthesia Postprocedure Evaluation (Signed)
  Anesthesia Post-op Note  Patient: Karen Beard  Procedure(s) Performed: Procedure(s): AXILLARY SENTINEL NODE BIOPSY (Right)  Anesthesia type:General  Patient location: PACU  Post pain: Pain level controlled  Post assessment: Post-op Vital signs reviewed, Patient's Cardiovascular Status Stable, Respiratory Function Stable, Patent Airway and No signs of Nausea or vomiting  Post vital signs: Reviewed and stable  Last Vitals:  Filed Vitals:   02/23/15 1252  BP: 128/68  Pulse: 69  Temp:   Resp: 18    Level of consciousness: awake, alert  and patient cooperative  Complications: No apparent anesthesia complications

## 2015-02-23 NOTE — Op Note (Signed)
Preop diagnosis: Carcinoma right breast central location  Post op diagnosis: Same  Operation: Right axillary sentinel node biopsy  Surgeon: S.G.Carylon Tamburro  Assistant:     Anesthesia: Gen.  Complications: None  EBL: Less than 20 mL  Drains: None  Description: Patient underwent nuclear contrast injection preoperatively. The patient was then put to sleep in supine position the operating table. With the Gamma finder that was only limited activity noted in the inferior portion of the axilla in the anterior location. To facilitate sentinel node identification 5 mL of a 50% diluted methylene blue was also injected in the central right breast. Right axilla was then prepped and draped as sterile field. A transverse incision was made in the inferior portion of the axilla from   below the anterior fold towards the posterior fold this was then dissected down U with the cautery down to the axillary fat pad. Identification of sentinel node was then established with the use of the Gamma finder and 4 separate nodes were identified with signal activity and to them with blue dye. All 4 were separately identified and sent for pathology which on immediate assessment showed no evidence of metastatic disease. After ensuring hemostasis the deep tissues were closed in 2 layers of 2-0 Vicryl. Skin closed with subcuticular 3-0 Monocryl coated with liquid ban. Procedure was well-tolerated and she was returned recovery room in stable condition.

## 2015-02-23 NOTE — Anesthesia Preprocedure Evaluation (Addendum)
Anesthesia Evaluation  Patient identified by MRN, date of birth, ID band Patient awake    Reviewed: Allergy & Precautions, NPO status , Patient's Chart, lab work & pertinent test results  Airway Mallampati: II  TM Distance: >3 FB     Dental  (+) Chipped, Poor Dentition   Pulmonary neg pulmonary ROS,    Pulmonary exam normal breath sounds clear to auscultation       Cardiovascular hypertension, Normal cardiovascular exam     Neuro/Psych  Headaches, Seizures -, Well Controlled,  Depression Sz as a child, none since    GI/Hepatic Neg liver ROS, GERD  Medicated and Controlled,  Endo/Other  negative endocrine ROS  Renal/GU negative Renal ROS     Musculoskeletal   Abdominal Normal abdominal exam  (+)   Peds negative pediatric ROS (+)  Hematology negative hematology ROS (+)   Anesthesia Other Findings   Reproductive/Obstetrics negative OB ROS                            Anesthesia Physical Anesthesia Plan  ASA: II  Anesthesia Plan: General   Post-op Pain Management:    Induction: Intravenous  Airway Management Planned: Oral ETT  Additional Equipment:   Intra-op Plan:   Post-operative Plan: Extubation in OR  Informed Consent: I have reviewed the patients History and Physical, chart, labs and discussed the procedure including the risks, benefits and alternatives for the proposed anesthesia with the patient or authorized representative who has indicated his/her understanding and acceptance.   Dental advisory given  Plan Discussed with: CRNA and Surgeon  Anesthesia Plan Comments:         Anesthesia Quick Evaluation

## 2015-02-23 NOTE — Anesthesia Procedure Notes (Signed)
Procedure Name: Intubation Date/Time: 02/23/2015 10:35 AM Performed by: Justus Memory Pre-anesthesia Checklist: Patient identified, Emergency Drugs available, Suction available and Patient being monitored Patient Re-evaluated:Patient Re-evaluated prior to inductionOxygen Delivery Method: Circle system utilized Preoxygenation: Pre-oxygenation with 100% oxygen Intubation Type: IV induction Ventilation: Mask ventilation without difficulty Laryngoscope Size: Mac and 3 Grade View: Grade I Tube type: Oral Number of attempts: 1 Airway Equipment and Method: Stylet Placement Confirmation: ETT inserted through vocal cords under direct vision,  positive ETCO2,  CO2 detector and breath sounds checked- equal and bilateral Secured at: 21 cm Tube secured with: Tape Dental Injury: Teeth and Oropharynx as per pre-operative assessment

## 2015-02-23 NOTE — Discharge Instructions (Signed)

## 2015-02-23 NOTE — Transfer of Care (Signed)
Immediate Anesthesia Transfer of Care Note  Patient: Karen Beard  Procedure(s) Performed: Procedure(s): AXILLARY SENTINEL NODE BIOPSY (Right)  Patient Location: PACU  Anesthesia Type:General  Level of Consciousness: awake, alert  and oriented  Airway & Oxygen Therapy: Patient Spontanous Breathing and Patient connected to nasal cannula oxygen  Post-op Assessment: Report given to RN and Post -op Vital signs reviewed and stable  Post vital signs: Reviewed and stable  Last Vitals:  Filed Vitals:   02/23/15 0828  BP: 142/82  Pulse: 83  Temp: 36.6 C  Resp: 16    Complications: No apparent anesthesia complications

## 2015-02-23 NOTE — H&P (View-Only) (Signed)
Patient ID: Karen Beard, female   DOB: 05/19/1971, 44 y.o.   MRN: [id number]  Chief Complaint  Patient presents with  . Follow-up    Right Breast Lumpectomy    HPI Karen Beard is a 44 y.o. female.  Patient is here for 1 week follow up for her right breast lumpectomy done on 02/06/2015. Patient stated that she has not had any pain but has had some swelling on her bilateral legs for 1 week. She is here today with her mother.  HPI  Past Medical History  Diagnosis Date  . Depression   . Migraine   . GERD (gastroesophageal reflux disease)   . Seizures     AS A CHILD-NONE SINCE  . Edema of both legs   . Learning disability   . Hypertension     RECENTLY TAKEN OFF OF MEDS PER MOM   . Cancer 01-26-15    INVASIVE MAMMARY CARCINOMA and DCIS    Past Surgical History  Procedure Laterality Date  . Axillary hidradenitis excision      unc  . Inguinal hidradenitis excision    . Carpal tunnel release    . Breast ductal system excision Bilateral 01/26/2015    Procedure: BILATERAL EXCISION DUCTAL SYSTEM BREAST;  Surgeon: Seeplaputhur G Sankar, MD;  Location: [facility] ORS;  Service: General;  Laterality: Bilateral;  . Breast lumpectomy Right 02/06/2015    Procedure: BREAST LUMPECTOMY;  Surgeon: Seeplaputhur G Sankar, MD;  Location: [facility] ORS;  Service: General;  Laterality: Right;    Family History  Problem Relation Age of Onset  . Breast cancer Sister 50  . Testicular cancer Father     Social History Social History  Substance Use Topics  . Smoking status: Never Smoker   . Smokeless tobacco: Never Used  . Alcohol Use: No    Allergies  Allergen Reactions  . Cephalexin Nausea And Vomiting  . Chocolate Swelling  . Corn Oil Other (See Comments)  . Peanut Oil Other (See Comments)  . Phenobarbital Other (See Comments)  . Sumatriptan Other (See Comments)  . Vilazodone Hcl Nausea Only  . Duloxetine Hcl Anxiety    agitation  . Tape Rash    Current Outpatient Prescriptions   Medication Sig Dispense Refill  . acetaminophen (TYLENOL) 500 MG tablet Take 500 mg by mouth as needed.    . aspirin-acetaminophen-caffeine (EXCEDRIN MIGRAINE) 250-250-65 MG per tablet Take 1 tablet by mouth every 6 (six) hours as needed for headache.    . aspirin-sod bicarb-citric acid (ALKA-SELTZER) 325 MG TBEF tablet Take 325 mg by mouth every 6 (six) hours as needed.    . calcium carbonate (TUMS - DOSED IN MG ELEMENTAL CALCIUM) 500 MG chewable tablet Chew 1 tablet by mouth daily.    . cetirizine (ZYRTEC) 10 MG tablet Take 10 mg by mouth daily as needed for allergies.    . ranitidine (ZANTAC) 75 MG tablet Take 75 mg by mouth as needed for heartburn.     No current facility-administered medications for this visit.    Review of Systems Review of Systems  Constitutional: Negative.   Respiratory: Negative.   Cardiovascular: Positive for leg swelling.    Blood pressure 120/78, pulse 68, resp. rate 14, height 5' 2" (1.575 m), weight 185 lb (83.915 kg), last menstrual period 01/18/2015.  Physical Exam Physical Exam  Constitutional: She is oriented to person, place, and time. She appears well-developed and well-nourished.  HENT:  Mouth/Throat: Oropharynx is clear and moist.  Eyes: Conjunctivae are normal. No   scleral icterus.  Neck: Neck supple.  Cardiovascular: Normal rate, regular rhythm and normal heart sounds.   Bilateral lower leg edema.  Pulmonary/Chest: Effort normal and breath sounds normal.  Incision healing right breast with bruising.   Abdominal: Soft.  Lymphadenopathy:    She has no cervical adenopathy.  Neurological: She is alert and oriented to person, place, and time.  Skin: Skin is warm and dry.  Psychiatric: Her behavior is normal.    Data Reviewed Progress notes and pathology- margins are clear bu there was a 49m focus of invasive CA. 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 Leg edema is not  new. No labs done in last 5 mos.    Plan   Pt was advised fully on pathology. Feel a sentinel node biopsy is indicated. Reasons, potential change in extent of treatment if node is involved were discussed fully. Discussed risk and benefits for right SN biopsy. Pt is agreeable. CBC  CA 27-29 MEt C    [facility]   PCP: JMaryland Pink MD   SJunie PanningG 02/14/2015, 4:04 PM

## 2015-02-23 NOTE — Interval H&P Note (Signed)
History and Physical Interval Note:  02/23/2015 10:01 AM  Karen Beard  has presented today for surgery, with the diagnosis of cancer right breast  The various methods of treatment have been discussed with the patient and family. After consideration of risks, benefits and other options for treatment, the patient has consented to  Procedure(s): AXILLARY SENTINEL NODE BIOPSY (Right) as a surgical intervention .  The patient's history has been reviewed, patient examined, no change in status, stable for surgery.  I have reviewed the patient's chart and labs.  Questions were answered to the patient's satisfaction.     SANKAR,SEEPLAPUTHUR G

## 2015-02-24 LAB — SURGICAL PATHOLOGY

## 2015-02-28 ENCOUNTER — Other Ambulatory Visit: Payer: Self-pay

## 2015-02-28 DIAGNOSIS — C50911 Malignant neoplasm of unspecified site of right female breast: Secondary | ICD-10-CM

## 2015-03-03 ENCOUNTER — Ambulatory Visit
Admission: RE | Admit: 2015-03-03 | Discharge: 2015-03-03 | Disposition: A | Discharge status: Home / Self Care | Payer: Medicare Other | Source: Ambulatory Visit | Attending: Radiation Oncology | Admitting: Radiation Oncology

## 2015-03-03 ENCOUNTER — Encounter: Payer: Self-pay | Admitting: Radiation Oncology

## 2015-03-03 VITALS — BP 127/88 | HR 84 | Temp 98.0°F | Ht 62.0 in | Wt 186.5 lb

## 2015-03-03 DIAGNOSIS — C50911 Malignant neoplasm of unspecified site of right female breast: Secondary | ICD-10-CM

## 2015-03-03 DIAGNOSIS — Z510 Encounter for antineoplastic radiation therapy: Secondary | ICD-10-CM | POA: Insufficient documentation

## 2015-03-03 DIAGNOSIS — Z8043 Family history of malignant neoplasm of testis: Secondary | ICD-10-CM | POA: Insufficient documentation

## 2015-03-03 DIAGNOSIS — Z170 Estrogen receptor positive status [ER+]: Secondary | ICD-10-CM | POA: Insufficient documentation

## 2015-03-03 DIAGNOSIS — Z803 Family history of malignant neoplasm of breast: Secondary | ICD-10-CM | POA: Insufficient documentation

## 2015-03-03 NOTE — Consult Note (Signed)
Except an outstanding is perfect of Radiation Oncology NEW PATIENT EVALUATION  Name: Karen Beard  MRN: [id number]  Date:   03/03/2015     DOB: December 16, 1970   This 44 y.o. female patient presents to the clinic for initial evaluation of breast cancer.  REFERRING PHYSICIAN: Maryland Pink, MD  CHIEF COMPLAINT:  Chief Complaint  Patient presents with  . 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   PREVIOUS INVESTIGATIONS:  Mammograms reviewed Surgical pathology reports reviewed Clinical notes including up note reviewed  HPI: Patient is a 44 year old female who has noted for about the past 3-4 months right nipple discharge with a brownish green tint. She was noted on ultrasound to have prominent ducts within the subareolar tissue of the right breast with heterogeneously dense breast tissue. She had attempt at ductography although by surgeon's recommendations was taken to surgery had bilateral breast biopsies. 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 Left breast showed stromal hyperplasia and ductal hyperplasia. No evidence for atypia or malignancy in the left breast was noted. She went on to have a wide local excision and sentinel node biopsy. One sentinel lymph node showed isolated tumor cells fourth lymph nodes examined. 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 She had a background of extensive ductal carcinoma in situ surgical margins were negative for invasive cancer close to the medial margin for DCIS. Tumor was 5 mm in greatest dimension. Patient is seeing medical oncology early next week is now referred to radiation oncology for consideration of treatment. ER/PR status is still pending. Case was presented at our weekly tumor conference. She seen  today for radiation oncology evaluation.  PLANNED TREATMENT REGIMEN: Right breast and peripheral lymphatic radiation  PAST MEDICAL HISTORY:  has a past medical history of Depression; Migraine; GERD (gastroesophageal reflux disease); Seizures; Edema of both legs; Learning disability; Hypertension; and Cancer (01-26-15).    PAST SURGICAL HISTORY:  Past Surgical History  Procedure Laterality Date  . Axillary hidradenitis excision      unc  . Inguinal hidradenitis excision    . Carpal tunnel release    . Breast ductal system excision Bilateral 01/26/2015    Procedure: BILATERAL EXCISION DUCTAL SYSTEM BREAST;  Surgeon: Christene Lye, MD;  Location: [facility] ORS;  Service: General;  Laterality: Bilateral;  . Breast lumpectomy Right 02/06/2015    Procedure: BREAST LUMPECTOMY;  Surgeon: Christene Lye, MD;  Location: [facility] ORS;  Service: General;  Laterality: Right;  . Axillary sentinel node biopsy Right 02/23/2015    Procedure: AXILLARY SENTINEL NODE BIOPSY;  Surgeon: Christene Lye, MD;  Location: [facility] ORS;  Service: General;  Laterality: Right;    FAMILY HISTORY: family history includes Breast cancer (age of onset: 66) in her sister; Testicular cancer in her father.  SOCIAL HISTORY:  reports that she has never smoked. She has never used smokeless tobacco. She reports that she does not drink alcohol or use illicit drugs.  ALLERGIES: Cephalexin; Chocolate; Corn oil; Peanut oil; Phenobarbital; Sumatriptan; Vilazodone hcl; Duloxetine hcl; Latex; and Tape  MEDICATIONS:  Current Outpatient Prescriptions  Medication Sig Dispense Refill  . acetaminophen (TYLENOL) 500 MG tablet Take 500 mg by mouth as needed.    Marland Kitchen aspirin-acetaminophen-caffeine (EXCEDRIN MIGRAINE) 250-250-65 MG per tablet Take 1 tablet by mouth every 6 (six) hours as needed  for headache.    Marland Kitchen aspirin-sod bicarb-citric acid (ALKA-SELTZER) 325 MG TBEF tablet Take 325 mg by mouth every 6 (six) hours as needed.    .  calcium carbonate (TUMS - DOSED IN MG ELEMENTAL CALCIUM) 500 MG chewable tablet Chew 1 tablet by mouth daily.    . cetirizine (ZYRTEC) 10 MG tablet Take 10 mg by mouth daily as needed for allergies.    Marland Kitchen docusate sodium (COLACE) 50 MG capsule Take 50 mg by mouth daily as needed for mild constipation.    . ranitidine (ZANTAC) 75 MG tablet Take 75 mg by mouth as needed for heartburn.     No current facility-administered medications for this encounter.    ECOG PERFORMANCE STATUS:  0 - Asymptomatic  REVIEW OF SYSTEMS:  Patient denies any weight loss, fatigue, weakness, fever, chills or night sweats. Patient denies any loss of vision, blurred vision. Patient denies any ringing  of the ears or hearing loss. No irregular heartbeat. Patient denies heart murmur or history of fainting. Patient denies any chest pain or pain radiating to her upper extremities. Patient denies any shortness of breath, difficulty breathing at night, cough or hemoptysis. Patient denies any swelling in the lower legs. Patient denies any nausea vomiting, vomiting of blood, or coffee ground material in the vomitus. Patient denies any stomach pain. Patient states has had normal bowel movements no significant constipation or diarrhea. Patient denies any dysuria, hematuria or significant nocturia. Patient denies any problems walking, swelling in the joints or loss of balance. Patient denies any skin changes, loss of hair or loss of weight. Patient denies any excessive worrying or anxiety or significant depression. Patient denies any problems with insomnia. Patient denies excessive thirst, polyuria, polydipsia. Patient denies any swollen glands, patient denies easy bruising or easy bleeding. Patient denies any recent infections, allergies or URI. Patient "s visual fields have not changed significantly in recent time.    PHYSICAL EXAM: BP 127/88 mmHg  Pulse 84  Temp(Src) 98 F (36.7 C)  Ht 5\' 2"  (1.575 m)  Wt 186 lb 8.2 oz (84.6 kg)   BMI 34.10 kg/m2  LMP 02/15/2015 Patient status post bilateral wide local excisions of either breast both are well healed. The right breast has sacrifice of the nipple areolar complex although the cosmetic result is still good. No dominant mass or nodularity is noted in either breast in 2 positions examined. No axillary or supra clavicular adenopathy is identified she does have some puffiness of the right axilla secondary surgery. Well-developed well-nourished patient in NAD. HEENT reveals PERLA, EOMI, discs not visualized.  Oral cavity is clear. No oral mucosal lesions are identified. Neck is clear without evidence of cervical or supraclavicular adenopathy. Lungs are clear to A&P. Cardiac examination is essentially unremarkable with regular rate and rhythm without murmur rub or thrill. Abdomen is benign with no organomegaly or masses noted. Motor sensory and DTR levels are equal and symmetric in the upper and lower extremities. Cranial nerves II through XII are grossly intact. Proprioception is intact. No peripheral adenopathy or edema is identified. No motor or sensory levels are noted. Crude visual fields are within normal range.   LABORATORY DATA: Multiple pathology reports are reviewed    RADIOLOGY RESULTS: Mammograms are reviewed and ultrasound   IMPRESSION: T1 1 N0 M0 stage I invasive mammary carcinoma of the right breast status post wide local excision and sentinel node biopsy in 44 year old female  PLAN: At this time I have recommended whole breast radiation. Based on the scattered  few tumor cells in her right sentinel lymph node also would advocate for peripheral lymphatic radiation would treat both areas to 5000 cGy boost her scar another 2000 cGy based on the close DCIS margin. Risks and benefits of treatment including skin reaction fatigue inclusion of superficial lung alteration of blood counts and slight possibility of lymphedema of her right upper extremity were all explained in detail.  I've advocated that she needs to be exercising her arm is much is possible. She will be evaluated early next week by medical oncology based on the recommendations may proceed to whole breast radiation or if chemotherapy is offered will delay that until after that treatment. We will also will be tracking down her ER/PR status. Patient and her mother both seem to comprehend my treatment plan well.  I would like to take this opportunity for allowing me to participate in the care of your patient.Armstead [facility]., MD

## 2015-03-07 ENCOUNTER — Inpatient Hospital Stay: Payer: Medicare Other

## 2015-03-07 ENCOUNTER — Inpatient Hospital Stay: Payer: Medicare Other | Attending: Oncology | Admitting: Oncology

## 2015-03-07 ENCOUNTER — Other Ambulatory Visit: Payer: Self-pay | Admitting: Oncology

## 2015-03-07 VITALS — BP 120/77 | HR 76 | Temp 98.2°F | Ht 62.0 in | Wt 187.8 lb

## 2015-03-07 DIAGNOSIS — R531 Weakness: Secondary | ICD-10-CM | POA: Insufficient documentation

## 2015-03-07 DIAGNOSIS — R609 Edema, unspecified: Secondary | ICD-10-CM

## 2015-03-07 DIAGNOSIS — D509 Iron deficiency anemia, unspecified: Secondary | ICD-10-CM

## 2015-03-07 DIAGNOSIS — C50911 Malignant neoplasm of unspecified site of right female breast: Secondary | ICD-10-CM

## 2015-03-07 DIAGNOSIS — Z7982 Long term (current) use of aspirin: Secondary | ICD-10-CM

## 2015-03-07 DIAGNOSIS — Z803 Family history of malignant neoplasm of breast: Secondary | ICD-10-CM | POA: Diagnosis not present

## 2015-03-07 DIAGNOSIS — F329 Major depressive disorder, single episode, unspecified: Secondary | ICD-10-CM | POA: Diagnosis not present

## 2015-03-07 DIAGNOSIS — R5383 Other fatigue: Secondary | ICD-10-CM | POA: Insufficient documentation

## 2015-03-07 DIAGNOSIS — F419 Anxiety disorder, unspecified: Secondary | ICD-10-CM

## 2015-03-07 DIAGNOSIS — Z79899 Other long term (current) drug therapy: Secondary | ICD-10-CM | POA: Diagnosis not present

## 2015-03-07 DIAGNOSIS — Z8669 Personal history of other diseases of the nervous system and sense organs: Secondary | ICD-10-CM

## 2015-03-07 DIAGNOSIS — F819 Developmental disorder of scholastic skills, unspecified: Secondary | ICD-10-CM | POA: Diagnosis not present

## 2015-03-07 DIAGNOSIS — I10 Essential (primary) hypertension: Secondary | ICD-10-CM | POA: Diagnosis not present

## 2015-03-07 DIAGNOSIS — D649 Anemia, unspecified: Secondary | ICD-10-CM

## 2015-03-07 DIAGNOSIS — R0602 Shortness of breath: Secondary | ICD-10-CM

## 2015-03-07 LAB — CBC
HCT: 30.1 % — ABNORMAL LOW (ref 35.0–47.0)
Hemoglobin: 9 g/dL — ABNORMAL LOW (ref 12.0–16.0)
MCH: 19.6 pg — AB (ref 26.0–34.0)
MCHC: 30 g/dL — AB (ref 32.0–36.0)
MCV: 65.2 fL — ABNORMAL LOW (ref 80.0–100.0)
Platelets: 340 10*3/uL (ref 150–440)
RBC: 4.62 MIL/uL (ref 3.80–5.20)
RDW: 16.6 % — AB (ref 11.5–14.5)
WBC: 7.5 10*3/uL (ref 3.6–11.0)

## 2015-03-07 LAB — IRON AND TIBC
IRON: 19 ug/dL — AB (ref 28–170)
Saturation Ratios: 6 % — ABNORMAL LOW (ref 10.4–31.8)
TIBC: 338 ug/dL (ref 250–450)
UIBC: 319 ug/dL

## 2015-03-07 LAB — FERRITIN: FERRITIN: 5 ng/mL — AB (ref 11–307)

## 2015-03-07 LAB — VITAMIN B12: Vitamin B-12: 139 pg/mL — ABNORMAL LOW (ref 180–914)

## 2015-03-07 LAB — FOLATE: FOLATE: 7 ng/mL (ref >5.9)

## 2015-03-07 LAB — DAT, POLYSPECIFIC AHG (ARMC ONLY): POLYSPECIFIC AHG TEST: NEGATIVE

## 2015-03-07 LAB — LACTATE DEHYDROGENASE: LDH: 194 U/L — AB (ref 98–192)

## 2015-03-07 NOTE — Progress Notes (Signed)
Patient here for initial visit referral by Dr. Jamal Collin

## 2015-03-08 ENCOUNTER — Encounter: Payer: Self-pay | Admitting: General Surgery

## 2015-03-08 ENCOUNTER — Ambulatory Visit (INDEPENDENT_AMBULATORY_CARE_PROVIDER_SITE_OTHER): Payer: Medicare Other | Admitting: General Surgery

## 2015-03-08 ENCOUNTER — Ambulatory Visit
Admission: RE | Admit: 2015-03-08 | Discharge: 2015-03-08 | Disposition: A | Discharge status: Home / Self Care | Payer: Medicare Other | Source: Ambulatory Visit | Attending: Radiation Oncology | Admitting: Radiation Oncology

## 2015-03-08 ENCOUNTER — Ambulatory Visit: Payer: Medicare Other

## 2015-03-08 DIAGNOSIS — C50911 Malignant neoplasm of unspecified site of right female breast: Secondary | ICD-10-CM

## 2015-03-08 LAB — HAPTOGLOBIN: HAPTOGLOBIN: 162 mg/dL (ref 34–200)

## 2015-03-08 NOTE — Progress Notes (Signed)
  Oncology Nurse Navigator Documentation    Navigator Encounter Type: Initial MedOnc (03/08/15 0900) Patient Visit Type: Medonc (03/08/15 0900)   Barriers/Navigation Needs: Family concerns;Education (03/08/15 0900) Education: Understanding Cancer/ Treatment Options;Newly Diagnosed Cancer Education (03/08/15 0900) Interventions: Coordination of Care (03/08/15 0900)   Coordination of Care: MD Appointments;Radiology (03/08/15 0900)        Time Spent with Patient: 60 (03/08/15 0900)  Met with patient, her mother, and sister Sharyn Lull at initial consult with Dr. Grayland Ormond yesterday.  Phoned Dr. Luana Shu in lab as HER2  FISH results not available.  She states results will be back in about 2 days. Patient reports being very tired, and has peripheral edema which she has been experienced prior to breast cancer diagnosis. Scheduled for labs, echocardiogram, and Oncotype DX to be sent.    Escorted to lab, and reviewed follow-up appointments.  She will return tomorrow for scheduled radiation simulation, and will see Dr. Grayland Ormond in one week.

## 2015-03-08 NOTE — Progress Notes (Signed)
Here today for postoperative visit, right axillary node biopsy on 02-23-15 with known breast cancer. States she is doing well. Patient reports being tired, and has peripheral edema which she has been experienced prior to breast cancer diagnosis. Both lumpectomy and SN sites are well healed with no signs of infection or seroma noted. There was isolated tumor cells in 1/4 lymph node. Pt has seen Dr. Grayland Ormond and Dr. Baruch Gouty from medical and radiation oncology. Oncotype has been requested. Also being arranged for Gyn evaluation for excessive menstrual bleeding  Follow up one month.  PCP:  Maryland Pink.

## 2015-03-08 NOTE — Patient Instructions (Signed)
The patient is aware to call back for any questions or concerns.  

## 2015-03-10 ENCOUNTER — Ambulatory Visit: Payer: Medicare Other

## 2015-03-13 ENCOUNTER — Encounter: Payer: Self-pay | Admitting: Oncology

## 2015-03-13 ENCOUNTER — Telehealth: Payer: Self-pay | Admitting: *Deleted

## 2015-03-13 ENCOUNTER — Ambulatory Visit
Admission: RE | Admit: 2015-03-13 | Discharge: 2015-03-13 | Disposition: A | Discharge status: Home / Self Care | Payer: Medicare Other | Source: Ambulatory Visit | Attending: Oncology | Admitting: Oncology

## 2015-03-13 DIAGNOSIS — C50911 Malignant neoplasm of unspecified site of right female breast: Secondary | ICD-10-CM

## 2015-03-13 DIAGNOSIS — R0602 Shortness of breath: Secondary | ICD-10-CM | POA: Insufficient documentation

## 2015-03-13 NOTE — Telephone Encounter (Signed)
Patient and her mom called to get patients lab results from last week.  Patient requested I give them to her mom.  Reviewed her low HGB, Ferritin and Iron.  States she has an appointment for iron infusion tomorrow.  Patient's mom had several questions regarding anemia that I could not answer.  Encouraged her to ask Dr. Grayland Ormond tomorrow.  She is agreeable.

## 2015-03-13 NOTE — Progress Notes (Signed)
*  PRELIMINARY RESULTS* Echocardiogram 2D Echocardiogram has been performed.  Laqueta Jean Hege 03/13/2015, 11:43 AM

## 2015-03-14 ENCOUNTER — Inpatient Hospital Stay: Payer: Medicare Other

## 2015-03-14 ENCOUNTER — Inpatient Hospital Stay (HOSPITAL_BASED_OUTPATIENT_CLINIC_OR_DEPARTMENT_OTHER): Payer: Medicare Other | Admitting: Oncology

## 2015-03-14 ENCOUNTER — Telehealth: Payer: Self-pay

## 2015-03-14 VITALS — BP 142/84 | HR 96 | Temp 97.6°F | Resp 20 | Wt 189.2 lb

## 2015-03-14 DIAGNOSIS — Z8669 Personal history of other diseases of the nervous system and sense organs: Secondary | ICD-10-CM

## 2015-03-14 DIAGNOSIS — F419 Anxiety disorder, unspecified: Secondary | ICD-10-CM

## 2015-03-14 DIAGNOSIS — C50911 Malignant neoplasm of unspecified site of right female breast: Secondary | ICD-10-CM

## 2015-03-14 DIAGNOSIS — R609 Edema, unspecified: Secondary | ICD-10-CM

## 2015-03-14 DIAGNOSIS — D509 Iron deficiency anemia, unspecified: Secondary | ICD-10-CM

## 2015-03-14 DIAGNOSIS — Z803 Family history of malignant neoplasm of breast: Secondary | ICD-10-CM

## 2015-03-14 DIAGNOSIS — Z79899 Other long term (current) drug therapy: Secondary | ICD-10-CM

## 2015-03-14 DIAGNOSIS — I10 Essential (primary) hypertension: Secondary | ICD-10-CM

## 2015-03-14 DIAGNOSIS — F329 Major depressive disorder, single episode, unspecified: Secondary | ICD-10-CM

## 2015-03-14 DIAGNOSIS — Z7982 Long term (current) use of aspirin: Secondary | ICD-10-CM

## 2015-03-14 DIAGNOSIS — R5383 Other fatigue: Secondary | ICD-10-CM

## 2015-03-14 DIAGNOSIS — R531 Weakness: Secondary | ICD-10-CM

## 2015-03-14 DIAGNOSIS — F819 Developmental disorder of scholastic skills, unspecified: Secondary | ICD-10-CM

## 2015-03-14 LAB — SURGICAL PATHOLOGY

## 2015-03-14 MED ORDER — SODIUM CHLORIDE 0.9 % IV SOLN
Freq: Once | INTRAVENOUS | Status: AC
  Administered 2015-03-14: 16:00:00 via INTRAVENOUS
  Filled 2015-03-14: qty 1000

## 2015-03-14 MED ORDER — SODIUM CHLORIDE 0.9 % IV SOLN
510.0000 mg | Freq: Once | INTRAVENOUS | Status: AC
  Administered 2015-03-14: 510 mg via INTRAVENOUS
  Filled 2015-03-14: qty 17

## 2015-03-14 NOTE — Progress Notes (Signed)
Met patient, and her mother at follow-up visit with Dr. Grayland Ormond.  Received HER2 negative results form Dr. Luana Shu.  Patient receiving Shirlean Kelly today, and will return next Tuesday.  Oncotype results pending. Patient requests information to be discussed with her mother, and her sister Alisia Ferrari.   Oncology Nurse Navigator Documentation    Navigator Encounter Type: Treatment (03/14/15 1700) Patient Visit Type: Medonc;Follow-up (03/14/15 1700) Treatment Phase: Other (03/14/15 1700) Barriers/Navigation Needs: Family concerns;Education (03/14/15 1700) Education: Understanding Cancer/ Treatment Options;Newly Diagnosed Cancer Education (03/14/15 1700) Interventions: Coordination of Care (03/14/15 1700)   Coordination of Care: MD Appointments (03/14/15 1700)        Time Spent with Patient: 30 (03/14/15 1700)

## 2015-03-14 NOTE — Telephone Encounter (Signed)
Appointment with Dr. Dossie Arbour at [facility] on 03/30/15 @ 9:45 Phone: [phone]

## 2015-03-15 DIAGNOSIS — Z170 Estrogen receptor positive status [ER+]: Secondary | ICD-10-CM | POA: Insufficient documentation

## 2015-03-15 DIAGNOSIS — C50011 Malignant neoplasm of nipple and areola, right female breast: Secondary | ICD-10-CM | POA: Insufficient documentation

## 2015-03-15 DIAGNOSIS — C50111 Malignant neoplasm of central portion of right female breast: Secondary | ICD-10-CM | POA: Insufficient documentation

## 2015-03-15 NOTE — Progress Notes (Signed)
[facility]  [phone] [phone]) [phone]  ID: Karen Beard OB: 1970-11-16  MR#: [id number]  PHX#:[id number]  Patient Care Team: Maryland Pink, MD as PCP - General (Family Medicine) Maryland Pink, MD as Referring Physician (Family Medicine) Seeplaputhur Robinette Haines, MD (General Surgery)  CHIEF COMPLAINT:  Chief Complaint  Patient presents with  . Anemia    INTERVAL HISTORY: Patient returns to clinic today for further evaluation, discussion of her pathology results, and initiation of IV iron. She continues to be highly anxious but otherwise feels well. She complains of increased anasarca. She has no neurologic complaints. She denies any chest pain or shortness of breath. She denies any nausea, vomiting, constipation, or diarrhea. Patient offers no further specific complaints.  REVIEW OF SYSTEMS:   Review of Systems  Constitutional: Positive for malaise/fatigue. Negative for fever.  Respiratory: Negative.   Cardiovascular: Positive for leg swelling.  Gastrointestinal: Negative for blood in stool and melena.  Musculoskeletal: Negative.   Neurological: Positive for weakness.  Psychiatric/Behavioral: The patient is nervous/anxious.     As per HPI. Otherwise, a complete review of systems is negatve.  PAST MEDICAL HISTORY: Past Medical History  Diagnosis Date  . Depression   . Migraine   . GERD (gastroesophageal reflux disease)   . Seizures     AS A CHILD-NONE SINCE  . Edema of both legs   . Learning disability   . Hypertension     RECENTLY TAKEN OFF OF MEDS PER MOM   . Cancer 01-26-15    INVASIVE MAMMARY CARCINOMA and DCIS    PAST SURGICAL HISTORY: Past Surgical History  Procedure Laterality Date  . Axillary hidradenitis excision      unc  . Inguinal hidradenitis excision    . Carpal tunnel release    . Breast ductal system excision Bilateral 01/26/2015    Procedure: BILATERAL EXCISION DUCTAL SYSTEM BREAST;  Surgeon: Christene Lye, MD;  Location: [facility] ORS;  Service: General;  Laterality: Bilateral;  . Breast lumpectomy Right 02/06/2015    Procedure: BREAST LUMPECTOMY;  Surgeon: Christene Lye, MD;  Location: [facility] ORS;  Service: General;  Laterality: Right;  . Axillary sentinel node biopsy Right 02/23/2015    Procedure: AXILLARY SN BIOPSY;  Dr Jamal Collin, MD;  [facility]S;  ISOLATED TUMOR CELLS IN ONE LYMPH NODE (1/1).    FAMILY HISTORY Family History  Problem Relation Age of Onset  . Breast cancer Sister 76  . Testicular cancer Father        ADVANCED DIRECTIVES:    HEALTH MAINTENANCE: Social History  Substance Use Topics  . Smoking status: Never Smoker   . Smokeless tobacco: Never Used  . Alcohol Use: No     Colonoscopy:  PAP:  Bone density:  Lipid panel:  Allergies  Allergen Reactions  . Cephalexin Nausea And Vomiting  . Chocolate Swelling  . Corn Oil Other (See Comments)  . Peanut Oil Other (See Comments)  . Phenobarbital Other (See Comments)  . Sumatriptan Other (See Comments)  . Vilazodone Hcl Nausea Only  . Duloxetine Hcl Anxiety    agitation  . Latex Rash  . Tape Rash    Current Outpatient Prescriptions  Medication Sig Dispense Refill  . acetaminophen (TYLENOL) 500 MG tablet Take 500 mg by mouth as needed.    Marland Kitchen aspirin-acetaminophen-caffeine (EXCEDRIN MIGRAINE) 250-250-65 MG per tablet Take 1 tablet by mouth every 6 (six) hours as needed for headache.    Marland Kitchen aspirin-sod bicarb-citric acid (ALKA-SELTZER) 325 MG TBEF tablet Take  325 mg by mouth every 6 (six) hours as needed.    . calcium carbonate (TUMS - DOSED IN MG ELEMENTAL CALCIUM) 500 MG chewable tablet Chew 1 tablet by mouth daily.    . cetirizine (ZYRTEC) 10 MG tablet Take 10 mg by mouth daily as needed for allergies.    Marland Kitchen docusate sodium (COLACE) 50 MG capsule Take 50 mg by mouth daily as needed for mild constipation.    . ranitidine (ZANTAC) 75 MG tablet Take 75 mg by mouth as needed for heartburn.     No current  facility-administered medications for this visit.    OBJECTIVE: Filed Vitals:   03/14/15 1500  BP: 142/84  Pulse: 96  Temp: 97.6 F (36.4 C)  Resp: 20     Body mass index is 34.59 kg/(m^2).    ECOG FS:0 - Asymptomatic  General: Well-developed, well-nourished, no acute distress. Eyes: Pink conjunctiva, anicteric sclera. Breasts: Patient request exam deferred today. Lungs: Clear to auscultation bilaterally. Heart: Regular rate and rhythm. No rubs, murmurs, or gallops. Abdomen: Soft, nontender, nondistended. No organomegaly noted, normoactive bowel sounds. Musculoskeletal: No edema, cyanosis, or clubbing. Neuro: Alert, answering all questions appropriately. Cranial nerves grossly intact. Skin: No rashes or petechiae noted. Psych: Normal affect.   LAB RESULTS:  Lab Results  Component Value Date   NA 142 02/14/2015   K 4.3 02/14/2015   CL 104 02/14/2015   CO2 23 02/14/2015   GLUCOSE 126* 02/14/2015   BUN 13 02/14/2015   CREATININE 0.75 02/14/2015   CALCIUM 8.3* 02/14/2015   PROT 5.4* 02/14/2015   AST 13 02/14/2015   ALT 7 02/14/2015   ALKPHOS 50 02/14/2015   BILITOT 0.4 02/14/2015   GFRNONAA 97 02/14/2015   GFRAA 111 02/14/2015    Lab Results  Component Value Date   WBC 7.5 03/07/2015   NEUTROABS 4.6 02/14/2015   HGB 9.0* 03/07/2015   HCT 30.1* 03/07/2015   MCV 65.2* 03/07/2015   PLT 340 03/07/2015     STUDIES: Nm Sentinel Node Inj-no Rpt (breast)  02/23/2015   CLINICAL DATA:  Breast cancer.  EXAM: NUCLEAR MEDICINE BREAST LYMPHOSCINTIGRAPHY right breast  TECHNIQUE: Intradermal injection of radiopharmaceutical was performed around the right outer nipple region/incision. The patient was then sent to the operating room where the sentinel node(s) were identified and removed by the surgeon.  RADIOPHARMACEUTICALS:  Total of 1 mCi Millipore-filtered Technetium-59msulfur colloid injected.  IMPRESSION: Uncomplicated intradermal injection of a total of 1 mCi Technetium-943msulfur colloid for purposes of sentinel node identification.   Electronically Signed   By: ThMarcello MooresRegister   On: 02/23/2015 09:03    ASSESSMENT: Pathologic stage IA ER/PR positive, HER-2/neu not overexpressing adenocarcinoma of the right breast.  PLAN:    1. Breast cancer: Patient will benefit from adjuvant XRT as well as tamoxifen. We will await Oncotype score is to assess whether chemotherapy is necessary. Patient will follow-up either with radiation oncology or medical oncology pending the results of her Oncotype testing. 2. Edema/anasarca: Cardiac echo revealed EF of 65-70%. Patient's kidney and liver function are also within normal limits. Unclear etiology. 3. Heavy menses: Patient was given a referral to gynecology. 4. Iron deficiency anemia: Patient's hemoglobin and iron stores are decreased and she will benefit from IV iron. Proceed with 510 mg IV Feraheme and return to clinic in 1 week for a second infusion. We will then repeat laboratory work in approximately 3 months for further evaluation.  Approximately 30 minutes was spent in discussion and consultation.  Patient expressed understanding and was in agreement with this plan. She also understands that She can call clinic at any time with any questions, concerns, or complaints.    Lloyd Huger, MD   03/15/2015 1:55 PM

## 2015-03-15 NOTE — Progress Notes (Signed)
[place]  [phone] Fax:(336) [phone]  ID: JAMICIA HAALAND OB: Mar 04, 1971  MR#: [id number]  WHQ#:[id number]  Patient Care Team: Maryland Pink, MD as PCP - General (Family Medicine) Maryland Pink, MD as Referring Physician (Family Medicine) Seeplaputhur Robinette Haines, MD (General Surgery)  CHIEF COMPLAINT:  Chief Complaint  Patient presents with  . Cancer    Referral    INTERVAL HISTORY: Patient is a 44 year old female who was noted to have an abnormal mammogram. Subsequent biopsy revealed DCIS. Patient then proceeded for lumpectomy and was found to have invasive carcinoma. She then underwent a sentinel node biopsy which was found to have isolated tumor cells. She also complains of increased anasarca and heavy menstrual cycles with anemia. She has an older sister who just completed chemotherapy for triple negative breast cancer. Currently, she is anxious but otherwise feels well. She has no neurologic complaints. She denies any chest pain or shortness of breath. She denies any nausea, vomiting, constipation, or diarrhea. Patient offers no further specific complaints.  REVIEW OF SYSTEMS:   Review of Systems  Constitutional: Positive for malaise/fatigue. Negative for fever.  Respiratory: Negative.   Cardiovascular: Positive for leg swelling.  Gastrointestinal: Negative for blood in stool and melena.  Musculoskeletal: Negative.   Neurological: Positive for weakness.  Psychiatric/Behavioral: The patient is nervous/anxious.     As per HPI. Otherwise, a complete review of systems is negatve.  PAST MEDICAL HISTORY: Past Medical History  Diagnosis Date  . Depression   . Migraine   . GERD (gastroesophageal reflux disease)   . Seizures     AS A CHILD-NONE SINCE  . Edema of both legs   . Learning disability   . Hypertension     RECENTLY TAKEN OFF OF MEDS PER MOM   . Cancer 01-26-15    INVASIVE MAMMARY CARCINOMA and DCIS    PAST SURGICAL  HISTORY: Past Surgical History  Procedure Laterality Date  . Axillary hidradenitis excision      unc  . Inguinal hidradenitis excision    . Carpal tunnel release    . Breast ductal system excision Bilateral 01/26/2015    Procedure: BILATERAL EXCISION DUCTAL SYSTEM BREAST;  Surgeon: Christene Lye, MD;  Location: [facility] ORS;  Service: General;  Laterality: Bilateral;  . Breast lumpectomy Right 02/06/2015    Procedure: BREAST LUMPECTOMY;  Surgeon: Christene Lye, MD;  Location: [facility] ORS;  Service: General;  Laterality: Right;  . Axillary sentinel node biopsy Right 02/23/2015    Procedure: AXILLARY SN BIOPSY;  Dr Jamal Collin, MD;  [facility] ORS;  ISOLATED TUMOR CELLS IN ONE LYMPH NODE (1/1).    FAMILY HISTORY Family History  Problem Relation Age of Onset  . Breast cancer Sister 37  . Testicular cancer Father        ADVANCED DIRECTIVES:    HEALTH MAINTENANCE: Social History  Substance Use Topics  . Smoking status: Never Smoker   . Smokeless tobacco: Never Used  . Alcohol Use: No     Colonoscopy:  PAP:  Bone density:  Lipid panel:  Allergies  Allergen Reactions  . Cephalexin Nausea And Vomiting  . Chocolate Swelling  . Corn Oil Other (See Comments)  . Peanut Oil Other (See Comments)  . Phenobarbital Other (See Comments)  . Sumatriptan Other (See Comments)  . Vilazodone Hcl Nausea Only  . Duloxetine Hcl Anxiety    agitation  . Latex Rash  . Tape Rash    Current Outpatient Prescriptions  Medication Sig Dispense Refill  .  acetaminophen (TYLENOL) 500 MG tablet Take 500 mg by mouth as needed.    Marland Kitchen aspirin-acetaminophen-caffeine (EXCEDRIN MIGRAINE) 250-250-65 MG per tablet Take 1 tablet by mouth every 6 (six) hours as needed for headache.    Marland Kitchen aspirin-sod bicarb-citric acid (ALKA-SELTZER) 325 MG TBEF tablet Take 325 mg by mouth every 6 (six) hours as needed.    . calcium carbonate (TUMS - DOSED IN MG ELEMENTAL CALCIUM) 500 MG chewable tablet Chew 1 tablet by mouth daily.     . cetirizine (ZYRTEC) 10 MG tablet Take 10 mg by mouth daily as needed for allergies.    Marland Kitchen docusate sodium (COLACE) 50 MG capsule Take 50 mg by mouth daily as needed for mild constipation.    . ranitidine (ZANTAC) 75 MG tablet Take 75 mg by mouth as needed for heartburn.     No current facility-administered medications for this visit.    OBJECTIVE: Filed Vitals:   03/07/15 1038  BP: 120/77  Pulse: 76  Temp: 98.2 F (36.8 C)     Body mass index is 34.35 kg/(m^2).    ECOG FS:0 - Asymptomatic  General: Well-developed, well-nourished, no acute distress. Eyes: Pink conjunctiva, anicteric sclera. HEENT: Normocephalic, moist mucous membranes, clear oropharnyx. Breasts: Patient request exam deferred today. Lungs: Clear to auscultation bilaterally. Heart: Regular rate and rhythm. No rubs, murmurs, or gallops. Abdomen: Soft, nontender, nondistended. No organomegaly noted, normoactive bowel sounds. Musculoskeletal: No edema, cyanosis, or clubbing. Neuro: Alert, answering all questions appropriately. Cranial nerves grossly intact. Skin: No rashes or petechiae noted. Psych: Normal affect.   LAB RESULTS:  Lab Results  Component Value Date   NA 142 02/14/2015   K 4.3 02/14/2015   CL 104 02/14/2015   CO2 23 02/14/2015   GLUCOSE 126* 02/14/2015   BUN 13 02/14/2015   CREATININE 0.75 02/14/2015   CALCIUM 8.3* 02/14/2015   PROT 5.4* 02/14/2015   AST 13 02/14/2015   ALT 7 02/14/2015   ALKPHOS 50 02/14/2015   BILITOT 0.4 02/14/2015   GFRNONAA 97 02/14/2015   GFRAA 111 02/14/2015    Lab Results  Component Value Date   WBC 7.5 03/07/2015   NEUTROABS 4.6 02/14/2015   HGB 9.0* 03/07/2015   HCT 30.1* 03/07/2015   MCV 65.2* 03/07/2015   PLT 340 03/07/2015     STUDIES: Nm Sentinel Node Inj-no Rpt (breast)  02/23/2015   CLINICAL DATA:  Breast cancer.  EXAM: NUCLEAR MEDICINE BREAST LYMPHOSCINTIGRAPHY right breast  TECHNIQUE: Intradermal injection of radiopharmaceutical was  performed around the right outer nipple region/incision. The patient was then sent to the operating room where the sentinel node(s) were identified and removed by the surgeon.  RADIOPHARMACEUTICALS:  Total of 1 mCi Millipore-filtered Technetium-96m sulfur colloid injected.  IMPRESSION: Uncomplicated intradermal injection of a total of 1 mCi Technetium-60m sulfur colloid for purposes of sentinel node identification.   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  PLAN:    1. Breast cancer: Patient was found to have an invasive component as well as isolated tumor cells on her sentinel node biopsy. HER-2 status is pending at time of visit. Patient has simulation for XRT scheduled for tomorrow, but will recommend delaying until Oncotype score is can be sent and resulted. Patient will benefit from tamoxifen for 5 years in the future given her premenopausal status and ER/PR positivity. Return to clinic in 1 week for further evaluation. 2. Edema/anasarca:  We will get a cardiac echo to further evaluate. 3. Heavy menses: Patient was given a referral to gynecology. 4. Iron deficiency anemia: Will get iron scores at next clinic visit and scheduled for IV Feraheme.  Approximately 60 minutes was spent in discussion and consultation.  Patient expressed understanding and was in agreement with this plan. She also understands that She can call clinic at any time with any questions, concerns, or complaints.   No matching staging information was found for the patient.  Lloyd Huger, MD   03/15/2015 1:42 PM

## 2015-03-21 ENCOUNTER — Inpatient Hospital Stay: Payer: Medicare Other | Attending: Oncology

## 2015-03-21 VITALS — BP 130/82 | HR 88 | Temp 97.0°F | Resp 20

## 2015-03-21 DIAGNOSIS — Z79899 Other long term (current) drug therapy: Secondary | ICD-10-CM | POA: Insufficient documentation

## 2015-03-21 DIAGNOSIS — D509 Iron deficiency anemia, unspecified: Secondary | ICD-10-CM | POA: Diagnosis not present

## 2015-03-21 MED ORDER — FERUMOXYTOL INJECTION 510 MG/17 ML
510.0000 mg | Freq: Once | INTRAVENOUS | Status: AC
  Administered 2015-03-21: 510 mg via INTRAVENOUS
  Filled 2015-03-21: qty 17

## 2015-03-21 MED ORDER — SODIUM CHLORIDE 0.9 % IV SOLN
Freq: Once | INTRAVENOUS | Status: AC
  Administered 2015-03-21: 14:00:00 via INTRAVENOUS
  Filled 2015-03-21: qty 1000

## 2015-03-29 ENCOUNTER — Ambulatory Visit
Admission: RE | Admit: 2015-03-29 | Discharge: 2015-03-29 | Disposition: A | Discharge status: Home / Self Care | Payer: Medicare Other | Source: Ambulatory Visit | Attending: Radiation Oncology | Admitting: Radiation Oncology

## 2015-03-29 DIAGNOSIS — C50911 Malignant neoplasm of unspecified site of right female breast: Secondary | ICD-10-CM | POA: Diagnosis not present

## 2015-03-29 DIAGNOSIS — Z510 Encounter for antineoplastic radiation therapy: Secondary | ICD-10-CM | POA: Diagnosis present

## 2015-03-29 DIAGNOSIS — Z8043 Family history of malignant neoplasm of testis: Secondary | ICD-10-CM | POA: Diagnosis not present

## 2015-03-29 DIAGNOSIS — Z803 Family history of malignant neoplasm of breast: Secondary | ICD-10-CM | POA: Diagnosis not present

## 2015-03-29 DIAGNOSIS — Z170 Estrogen receptor positive status [ER+]: Secondary | ICD-10-CM | POA: Diagnosis not present

## 2015-03-31 ENCOUNTER — Other Ambulatory Visit: Payer: Self-pay | Admitting: *Deleted

## 2015-03-31 DIAGNOSIS — C50911 Malignant neoplasm of unspecified site of right female breast: Secondary | ICD-10-CM

## 2015-04-03 DIAGNOSIS — Z510 Encounter for antineoplastic radiation therapy: Secondary | ICD-10-CM | POA: Diagnosis not present

## 2015-04-05 ENCOUNTER — Ambulatory Visit
Admission: RE | Admit: 2015-04-05 | Discharge: 2015-04-05 | Disposition: A | Discharge status: Home / Self Care | Payer: Medicare Other | Source: Ambulatory Visit | Attending: Radiation Oncology | Admitting: Radiation Oncology

## 2015-04-05 DIAGNOSIS — Z510 Encounter for antineoplastic radiation therapy: Secondary | ICD-10-CM | POA: Diagnosis not present

## 2015-04-06 ENCOUNTER — Ambulatory Visit
Admission: RE | Admit: 2015-04-06 | Discharge: 2015-04-06 | Disposition: A | Discharge status: Home / Self Care | Payer: Medicare Other | Source: Ambulatory Visit | Attending: Radiation Oncology | Admitting: Radiation Oncology

## 2015-04-06 ENCOUNTER — Encounter: Payer: Self-pay | Admitting: General Surgery

## 2015-04-06 ENCOUNTER — Ambulatory Visit (INDEPENDENT_AMBULATORY_CARE_PROVIDER_SITE_OTHER): Payer: Medicare Other | Admitting: General Surgery

## 2015-04-06 VITALS — BP 122/62 | HR 68 | Resp 12 | Ht 62.0 in | Wt 177.0 lb

## 2015-04-06 DIAGNOSIS — Z510 Encounter for antineoplastic radiation therapy: Secondary | ICD-10-CM | POA: Diagnosis not present

## 2015-04-06 DIAGNOSIS — D0511 Intraductal carcinoma in situ of right breast: Secondary | ICD-10-CM

## 2015-04-06 NOTE — Patient Instructions (Signed)
The patient is aware to call back for any questions or concerns.  

## 2015-04-06 NOTE — Progress Notes (Signed)
Here today for follow upvisit, right axillary node biopsy on 02-23-15 and right breast lumpectomy 02-06-15 with known breast cancer. States she is doing well. Patient reports being tired, and her peripheral edema has improved since she started lasix. Her first radiation treatment was today. Reportedly, oncotype score was low.   No drainage noted from left nipple. Exam shows lumpectomy and sentinel node incisions well healed.   Current plan is for completion of radiation followed by anti-hormonal therapy.  Follow-up in two months.

## 2015-04-07 ENCOUNTER — Ambulatory Visit
Admission: RE | Admit: 2015-04-07 | Discharge: 2015-04-07 | Disposition: A | Discharge status: Home / Self Care | Payer: Medicare Other | Source: Ambulatory Visit | Attending: Radiation Oncology | Admitting: Radiation Oncology

## 2015-04-07 DIAGNOSIS — Z510 Encounter for antineoplastic radiation therapy: Secondary | ICD-10-CM | POA: Diagnosis not present

## 2015-04-10 ENCOUNTER — Ambulatory Visit
Admission: RE | Admit: 2015-04-10 | Discharge: 2015-04-10 | Disposition: A | Discharge status: Home / Self Care | Payer: Medicare Other | Source: Ambulatory Visit | Attending: Radiation Oncology | Admitting: Radiation Oncology

## 2015-04-10 DIAGNOSIS — Z510 Encounter for antineoplastic radiation therapy: Secondary | ICD-10-CM | POA: Diagnosis not present

## 2015-04-11 ENCOUNTER — Ambulatory Visit
Admission: RE | Admit: 2015-04-11 | Discharge: 2015-04-11 | Disposition: A | Discharge status: Home / Self Care | Payer: Medicare Other | Source: Ambulatory Visit | Attending: Radiation Oncology | Admitting: Radiation Oncology

## 2015-04-11 DIAGNOSIS — Z510 Encounter for antineoplastic radiation therapy: Secondary | ICD-10-CM | POA: Diagnosis not present

## 2015-04-12 ENCOUNTER — Ambulatory Visit
Admission: RE | Admit: 2015-04-12 | Discharge: 2015-04-12 | Disposition: A | Discharge status: Home / Self Care | Payer: Medicare Other | Source: Ambulatory Visit | Attending: Radiation Oncology | Admitting: Radiation Oncology

## 2015-04-12 DIAGNOSIS — Z510 Encounter for antineoplastic radiation therapy: Secondary | ICD-10-CM | POA: Diagnosis not present

## 2015-04-13 ENCOUNTER — Ambulatory Visit
Admission: RE | Admit: 2015-04-13 | Discharge: 2015-04-13 | Disposition: A | Discharge status: Home / Self Care | Payer: Medicare Other | Source: Ambulatory Visit | Attending: Radiation Oncology | Admitting: Radiation Oncology

## 2015-04-13 DIAGNOSIS — Z510 Encounter for antineoplastic radiation therapy: Secondary | ICD-10-CM | POA: Diagnosis not present

## 2015-04-14 ENCOUNTER — Ambulatory Visit
Admission: RE | Admit: 2015-04-14 | Discharge: 2015-04-14 | Disposition: A | Discharge status: Home / Self Care | Payer: Medicare Other | Source: Ambulatory Visit | Attending: Radiation Oncology | Admitting: Radiation Oncology

## 2015-04-14 DIAGNOSIS — Z510 Encounter for antineoplastic radiation therapy: Secondary | ICD-10-CM | POA: Diagnosis not present

## 2015-04-16 ENCOUNTER — Ambulatory Visit
Admission: RE | Admit: 2015-04-16 | Discharge: 2015-04-16 | Disposition: A | Discharge status: Home / Self Care | Payer: Medicare Other | Source: Ambulatory Visit | Attending: Radiation Oncology | Admitting: Radiation Oncology

## 2015-04-17 ENCOUNTER — Ambulatory Visit
Admission: RE | Admit: 2015-04-17 | Discharge: 2015-04-17 | Disposition: A | Discharge status: Home / Self Care | Payer: Medicare Other | Source: Ambulatory Visit | Attending: Radiation Oncology | Admitting: Radiation Oncology

## 2015-04-17 DIAGNOSIS — Z510 Encounter for antineoplastic radiation therapy: Secondary | ICD-10-CM | POA: Diagnosis not present

## 2015-04-18 ENCOUNTER — Ambulatory Visit
Admission: RE | Admit: 2015-04-18 | Discharge: 2015-04-18 | Disposition: A | Discharge status: Home / Self Care | Payer: Medicare Other | Source: Ambulatory Visit | Attending: Radiation Oncology | Admitting: Radiation Oncology

## 2015-04-18 DIAGNOSIS — Z510 Encounter for antineoplastic radiation therapy: Secondary | ICD-10-CM | POA: Diagnosis not present

## 2015-04-19 ENCOUNTER — Ambulatory Visit
Admission: RE | Admit: 2015-04-19 | Discharge: 2015-04-19 | Disposition: A | Discharge status: Home / Self Care | Payer: Medicare Other | Source: Ambulatory Visit | Attending: Radiation Oncology | Admitting: Radiation Oncology

## 2015-04-19 DIAGNOSIS — Z510 Encounter for antineoplastic radiation therapy: Secondary | ICD-10-CM | POA: Diagnosis not present

## 2015-04-20 ENCOUNTER — Inpatient Hospital Stay: Payer: Medicare Other | Attending: Radiation Oncology

## 2015-04-20 ENCOUNTER — Ambulatory Visit
Admission: RE | Admit: 2015-04-20 | Discharge: 2015-04-20 | Disposition: A | Discharge status: Home / Self Care | Payer: Medicare Other | Source: Ambulatory Visit | Attending: Radiation Oncology | Admitting: Radiation Oncology

## 2015-04-20 ENCOUNTER — Ambulatory Visit: Payer: Medicare Other

## 2015-04-20 DIAGNOSIS — D0511 Intraductal carcinoma in situ of right breast: Secondary | ICD-10-CM | POA: Insufficient documentation

## 2015-04-21 ENCOUNTER — Inpatient Hospital Stay: Payer: Medicare Other

## 2015-04-21 ENCOUNTER — Ambulatory Visit
Admission: RE | Admit: 2015-04-21 | Discharge: 2015-04-21 | Disposition: A | Discharge status: Home / Self Care | Payer: Medicare Other | Source: Ambulatory Visit | Attending: Radiation Oncology | Admitting: Radiation Oncology

## 2015-04-21 DIAGNOSIS — C50911 Malignant neoplasm of unspecified site of right female breast: Secondary | ICD-10-CM

## 2015-04-21 DIAGNOSIS — Z510 Encounter for antineoplastic radiation therapy: Secondary | ICD-10-CM | POA: Diagnosis not present

## 2015-04-21 DIAGNOSIS — D0511 Intraductal carcinoma in situ of right breast: Secondary | ICD-10-CM | POA: Diagnosis present

## 2015-04-21 LAB — CBC
HEMATOCRIT: 39.9 % (ref 35.0–47.0)
Hemoglobin: 13 g/dL (ref 12.0–16.0)
MCH: 25 pg — AB (ref 26.0–34.0)
MCHC: 32.5 g/dL (ref 32.0–36.0)
MCV: 77 fL — AB (ref 80.0–100.0)
PLATELETS: 352 10*3/uL (ref 150–440)
RBC: 5.19 MIL/uL (ref 3.80–5.20)
RDW: 26.8 % — ABNORMAL HIGH (ref 11.5–14.5)
WBC: 8.2 10*3/uL (ref 3.6–11.0)

## 2015-04-24 ENCOUNTER — Ambulatory Visit
Admission: RE | Admit: 2015-04-24 | Discharge: 2015-04-24 | Disposition: A | Discharge status: Home / Self Care | Payer: Medicare Other | Source: Ambulatory Visit | Attending: Radiation Oncology | Admitting: Radiation Oncology

## 2015-04-24 DIAGNOSIS — Z510 Encounter for antineoplastic radiation therapy: Secondary | ICD-10-CM | POA: Diagnosis not present

## 2015-04-25 ENCOUNTER — Other Ambulatory Visit: Payer: Self-pay | Admitting: *Deleted

## 2015-04-25 ENCOUNTER — Ambulatory Visit
Admission: RE | Admit: 2015-04-25 | Discharge: 2015-04-25 | Disposition: A | Discharge status: Home / Self Care | Payer: Medicare Other | Source: Ambulatory Visit | Attending: Radiation Oncology | Admitting: Radiation Oncology

## 2015-04-25 DIAGNOSIS — Z510 Encounter for antineoplastic radiation therapy: Secondary | ICD-10-CM | POA: Diagnosis not present

## 2015-04-25 MED ORDER — AZITHROMYCIN 250 MG PO TABS: ORAL_TABLET | ORAL | Status: DC

## 2015-04-26 ENCOUNTER — Ambulatory Visit
Admission: RE | Admit: 2015-04-26 | Discharge: 2015-04-26 | Disposition: A | Discharge status: Home / Self Care | Payer: Medicare Other | Source: Ambulatory Visit | Attending: Radiation Oncology | Admitting: Radiation Oncology

## 2015-04-26 DIAGNOSIS — Z510 Encounter for antineoplastic radiation therapy: Secondary | ICD-10-CM | POA: Diagnosis not present

## 2015-04-27 ENCOUNTER — Ambulatory Visit
Admission: RE | Admit: 2015-04-27 | Discharge: 2015-04-27 | Disposition: A | Discharge status: Home / Self Care | Payer: Medicare Other | Source: Ambulatory Visit | Attending: Radiation Oncology | Admitting: Radiation Oncology

## 2015-04-27 DIAGNOSIS — Z510 Encounter for antineoplastic radiation therapy: Secondary | ICD-10-CM | POA: Diagnosis not present

## 2015-04-28 ENCOUNTER — Ambulatory Visit
Admission: RE | Admit: 2015-04-28 | Discharge: 2015-04-28 | Disposition: A | Discharge status: Home / Self Care | Payer: Medicare Other | Source: Ambulatory Visit | Attending: Radiation Oncology | Admitting: Radiation Oncology

## 2015-04-28 DIAGNOSIS — Z510 Encounter for antineoplastic radiation therapy: Secondary | ICD-10-CM | POA: Diagnosis not present

## 2015-05-01 ENCOUNTER — Ambulatory Visit
Admission: RE | Admit: 2015-05-01 | Discharge: 2015-05-01 | Disposition: A | Discharge status: Home / Self Care | Payer: Medicare Other | Source: Ambulatory Visit | Attending: Radiation Oncology | Admitting: Radiation Oncology

## 2015-05-01 DIAGNOSIS — Z510 Encounter for antineoplastic radiation therapy: Secondary | ICD-10-CM | POA: Diagnosis not present

## 2015-05-02 ENCOUNTER — Ambulatory Visit
Admission: RE | Admit: 2015-05-02 | Discharge: 2015-05-02 | Disposition: A | Discharge status: Home / Self Care | Payer: Medicare Other | Source: Ambulatory Visit | Attending: Radiation Oncology | Admitting: Radiation Oncology

## 2015-05-02 DIAGNOSIS — Z510 Encounter for antineoplastic radiation therapy: Secondary | ICD-10-CM | POA: Diagnosis not present

## 2015-05-03 ENCOUNTER — Ambulatory Visit
Admission: RE | Admit: 2015-05-03 | Discharge: 2015-05-03 | Disposition: A | Discharge status: Home / Self Care | Payer: Medicare Other | Source: Ambulatory Visit | Attending: Radiation Oncology | Admitting: Radiation Oncology

## 2015-05-03 DIAGNOSIS — Z510 Encounter for antineoplastic radiation therapy: Secondary | ICD-10-CM | POA: Diagnosis not present

## 2015-05-04 ENCOUNTER — Ambulatory Visit
Admission: RE | Admit: 2015-05-04 | Discharge: 2015-05-04 | Disposition: A | Discharge status: Home / Self Care | Payer: Medicare Other | Source: Ambulatory Visit | Attending: Radiation Oncology | Admitting: Radiation Oncology

## 2015-05-04 ENCOUNTER — Inpatient Hospital Stay: Payer: Medicare Other

## 2015-05-04 ENCOUNTER — Ambulatory Visit: Payer: Medicare Other

## 2015-05-04 DIAGNOSIS — D0511 Intraductal carcinoma in situ of right breast: Secondary | ICD-10-CM | POA: Diagnosis not present

## 2015-05-04 DIAGNOSIS — Z510 Encounter for antineoplastic radiation therapy: Secondary | ICD-10-CM | POA: Diagnosis not present

## 2015-05-04 DIAGNOSIS — C50911 Malignant neoplasm of unspecified site of right female breast: Secondary | ICD-10-CM

## 2015-05-04 LAB — CBC
HCT: 37.2 % (ref 35.0–47.0)
Hemoglobin: 12.2 g/dL (ref 12.0–16.0)
MCH: 25.7 pg — ABNORMAL LOW (ref 26.0–34.0)
MCHC: 32.8 g/dL (ref 32.0–36.0)
MCV: 78.2 fL — ABNORMAL LOW (ref 80.0–100.0)
PLATELETS: 283 10*3/uL (ref 150–440)
RBC: 4.76 MIL/uL (ref 3.80–5.20)
RDW: 24.7 % — AB (ref 11.5–14.5)
WBC: 6.3 10*3/uL (ref 3.6–11.0)

## 2015-05-05 ENCOUNTER — Ambulatory Visit
Admission: RE | Admit: 2015-05-05 | Discharge: 2015-05-05 | Disposition: A | Discharge status: Home / Self Care | Payer: Medicare Other | Source: Ambulatory Visit | Attending: Radiation Oncology | Admitting: Radiation Oncology

## 2015-05-05 DIAGNOSIS — Z510 Encounter for antineoplastic radiation therapy: Secondary | ICD-10-CM | POA: Diagnosis not present

## 2015-05-08 ENCOUNTER — Ambulatory Visit
Admission: RE | Admit: 2015-05-08 | Discharge: 2015-05-08 | Disposition: A | Discharge status: Home / Self Care | Payer: Medicare Other | Source: Ambulatory Visit | Attending: Radiation Oncology | Admitting: Radiation Oncology

## 2015-05-08 DIAGNOSIS — Z510 Encounter for antineoplastic radiation therapy: Secondary | ICD-10-CM | POA: Diagnosis not present

## 2015-05-09 ENCOUNTER — Ambulatory Visit
Admission: RE | Admit: 2015-05-09 | Discharge: 2015-05-09 | Disposition: A | Discharge status: Home / Self Care | Payer: Medicare Other | Source: Ambulatory Visit | Attending: Radiation Oncology | Admitting: Radiation Oncology

## 2015-05-09 DIAGNOSIS — Z510 Encounter for antineoplastic radiation therapy: Secondary | ICD-10-CM | POA: Diagnosis not present

## 2015-05-10 ENCOUNTER — Ambulatory Visit
Admission: RE | Admit: 2015-05-10 | Discharge: 2015-05-10 | Disposition: A | Discharge status: Home / Self Care | Payer: Medicare Other | Source: Ambulatory Visit | Attending: Radiation Oncology | Admitting: Radiation Oncology

## 2015-05-10 DIAGNOSIS — Z510 Encounter for antineoplastic radiation therapy: Secondary | ICD-10-CM | POA: Diagnosis not present

## 2015-05-15 ENCOUNTER — Ambulatory Visit
Admission: RE | Admit: 2015-05-15 | Discharge: 2015-05-15 | Disposition: A | Discharge status: Home / Self Care | Payer: Medicare Other | Source: Ambulatory Visit | Attending: Radiation Oncology | Admitting: Radiation Oncology

## 2015-05-15 DIAGNOSIS — Z510 Encounter for antineoplastic radiation therapy: Secondary | ICD-10-CM | POA: Diagnosis not present

## 2015-05-16 ENCOUNTER — Ambulatory Visit
Admission: RE | Admit: 2015-05-16 | Discharge: 2015-05-16 | Disposition: A | Discharge status: Home / Self Care | Payer: Medicare Other | Source: Ambulatory Visit | Attending: Radiation Oncology | Admitting: Radiation Oncology

## 2015-05-16 ENCOUNTER — Other Ambulatory Visit: Payer: Self-pay | Admitting: Oncology

## 2015-05-16 DIAGNOSIS — Z510 Encounter for antineoplastic radiation therapy: Secondary | ICD-10-CM | POA: Diagnosis not present

## 2015-05-16 DIAGNOSIS — D509 Iron deficiency anemia, unspecified: Secondary | ICD-10-CM

## 2015-05-17 ENCOUNTER — Ambulatory Visit
Admission: RE | Admit: 2015-05-17 | Discharge: 2015-05-17 | Disposition: A | Discharge status: Home / Self Care | Payer: Medicare Other | Source: Ambulatory Visit | Attending: Radiation Oncology | Admitting: Radiation Oncology

## 2015-05-17 DIAGNOSIS — Z510 Encounter for antineoplastic radiation therapy: Secondary | ICD-10-CM | POA: Diagnosis not present

## 2015-05-18 ENCOUNTER — Ambulatory Visit: Payer: Medicare Other

## 2015-05-18 ENCOUNTER — Inpatient Hospital Stay: Payer: Medicare Other | Attending: Radiation Oncology

## 2015-05-18 ENCOUNTER — Ambulatory Visit: Admission: RE | Admit: 2015-05-18 | Payer: Medicare Other | Source: Ambulatory Visit

## 2015-05-18 ENCOUNTER — Ambulatory Visit
Admission: RE | Admit: 2015-05-18 | Discharge: 2015-05-18 | Disposition: A | Discharge status: Home / Self Care | Payer: Medicare Other | Source: Ambulatory Visit | Attending: Radiation Oncology | Admitting: Radiation Oncology

## 2015-05-18 DIAGNOSIS — K219 Gastro-esophageal reflux disease without esophagitis: Secondary | ICD-10-CM | POA: Insufficient documentation

## 2015-05-18 DIAGNOSIS — F419 Anxiety disorder, unspecified: Secondary | ICD-10-CM | POA: Insufficient documentation

## 2015-05-18 DIAGNOSIS — I10 Essential (primary) hypertension: Secondary | ICD-10-CM | POA: Insufficient documentation

## 2015-05-18 DIAGNOSIS — D509 Iron deficiency anemia, unspecified: Secondary | ICD-10-CM | POA: Insufficient documentation

## 2015-05-18 DIAGNOSIS — Z79810 Long term (current) use of selective estrogen receptor modulators (SERMs): Secondary | ICD-10-CM | POA: Insufficient documentation

## 2015-05-18 DIAGNOSIS — Z79899 Other long term (current) drug therapy: Secondary | ICD-10-CM | POA: Insufficient documentation

## 2015-05-18 DIAGNOSIS — C50912 Malignant neoplasm of unspecified site of left female breast: Secondary | ICD-10-CM | POA: Insufficient documentation

## 2015-05-18 DIAGNOSIS — R609 Edema, unspecified: Secondary | ICD-10-CM | POA: Insufficient documentation

## 2015-05-18 DIAGNOSIS — F329 Major depressive disorder, single episode, unspecified: Secondary | ICD-10-CM | POA: Insufficient documentation

## 2015-05-18 DIAGNOSIS — Z803 Family history of malignant neoplasm of breast: Secondary | ICD-10-CM | POA: Insufficient documentation

## 2015-05-18 DIAGNOSIS — F819 Developmental disorder of scholastic skills, unspecified: Secondary | ICD-10-CM | POA: Insufficient documentation

## 2015-05-18 DIAGNOSIS — Z808 Family history of malignant neoplasm of other organs or systems: Secondary | ICD-10-CM | POA: Insufficient documentation

## 2015-05-18 DIAGNOSIS — Z8669 Personal history of other diseases of the nervous system and sense organs: Secondary | ICD-10-CM | POA: Insufficient documentation

## 2015-05-18 DIAGNOSIS — Z170 Estrogen receptor positive status [ER+]: Secondary | ICD-10-CM | POA: Insufficient documentation

## 2015-05-19 ENCOUNTER — Inpatient Hospital Stay: Payer: Medicare Other

## 2015-05-19 ENCOUNTER — Ambulatory Visit
Admission: RE | Admit: 2015-05-19 | Discharge: 2015-05-19 | Disposition: A | Discharge status: Home / Self Care | Payer: Medicare Other | Source: Ambulatory Visit | Attending: Radiation Oncology | Admitting: Radiation Oncology

## 2015-05-19 ENCOUNTER — Ambulatory Visit: Payer: Medicare Other

## 2015-05-19 DIAGNOSIS — C50911 Malignant neoplasm of unspecified site of right female breast: Secondary | ICD-10-CM

## 2015-05-19 DIAGNOSIS — D509 Iron deficiency anemia, unspecified: Secondary | ICD-10-CM | POA: Diagnosis not present

## 2015-05-19 DIAGNOSIS — Z170 Estrogen receptor positive status [ER+]: Secondary | ICD-10-CM | POA: Diagnosis not present

## 2015-05-19 DIAGNOSIS — F819 Developmental disorder of scholastic skills, unspecified: Secondary | ICD-10-CM | POA: Diagnosis not present

## 2015-05-19 DIAGNOSIS — R609 Edema, unspecified: Secondary | ICD-10-CM | POA: Diagnosis not present

## 2015-05-19 DIAGNOSIS — Z510 Encounter for antineoplastic radiation therapy: Secondary | ICD-10-CM | POA: Diagnosis not present

## 2015-05-19 DIAGNOSIS — Z808 Family history of malignant neoplasm of other organs or systems: Secondary | ICD-10-CM | POA: Diagnosis not present

## 2015-05-19 DIAGNOSIS — Z803 Family history of malignant neoplasm of breast: Secondary | ICD-10-CM | POA: Diagnosis not present

## 2015-05-19 DIAGNOSIS — I10 Essential (primary) hypertension: Secondary | ICD-10-CM | POA: Diagnosis not present

## 2015-05-19 DIAGNOSIS — Z79899 Other long term (current) drug therapy: Secondary | ICD-10-CM | POA: Diagnosis not present

## 2015-05-19 DIAGNOSIS — Z79810 Long term (current) use of selective estrogen receptor modulators (SERMs): Secondary | ICD-10-CM | POA: Diagnosis not present

## 2015-05-19 DIAGNOSIS — C50912 Malignant neoplasm of unspecified site of left female breast: Secondary | ICD-10-CM | POA: Diagnosis not present

## 2015-05-19 DIAGNOSIS — F329 Major depressive disorder, single episode, unspecified: Secondary | ICD-10-CM | POA: Diagnosis not present

## 2015-05-19 DIAGNOSIS — K219 Gastro-esophageal reflux disease without esophagitis: Secondary | ICD-10-CM | POA: Diagnosis not present

## 2015-05-19 DIAGNOSIS — Z8669 Personal history of other diseases of the nervous system and sense organs: Secondary | ICD-10-CM | POA: Diagnosis not present

## 2015-05-19 DIAGNOSIS — F419 Anxiety disorder, unspecified: Secondary | ICD-10-CM | POA: Diagnosis not present

## 2015-05-19 LAB — CBC
HCT: 37.9 % (ref 35.0–47.0)
HEMOGLOBIN: 12.4 g/dL (ref 12.0–16.0)
MCH: 26.1 pg (ref 26.0–34.0)
MCHC: 32.7 g/dL (ref 32.0–36.0)
MCV: 79.8 fL — AB (ref 80.0–100.0)
Platelets: 245 10*3/uL (ref 150–440)
RBC: 4.75 MIL/uL (ref 3.80–5.20)
RDW: 22 % — ABNORMAL HIGH (ref 11.5–14.5)
WBC: 5.1 10*3/uL (ref 3.6–11.0)

## 2015-05-22 ENCOUNTER — Ambulatory Visit
Admission: RE | Admit: 2015-05-22 | Discharge: 2015-05-22 | Disposition: A | Discharge status: Home / Self Care | Payer: Medicare Other | Source: Ambulatory Visit | Attending: Radiation Oncology | Admitting: Radiation Oncology

## 2015-05-22 DIAGNOSIS — Z510 Encounter for antineoplastic radiation therapy: Secondary | ICD-10-CM | POA: Diagnosis not present

## 2015-05-23 ENCOUNTER — Ambulatory Visit
Admission: RE | Admit: 2015-05-23 | Discharge: 2015-05-23 | Disposition: A | Discharge status: Home / Self Care | Payer: Medicare Other | Source: Ambulatory Visit | Attending: Radiation Oncology | Admitting: Radiation Oncology

## 2015-05-23 DIAGNOSIS — Z510 Encounter for antineoplastic radiation therapy: Secondary | ICD-10-CM | POA: Diagnosis not present

## 2015-05-24 ENCOUNTER — Ambulatory Visit
Admission: RE | Admit: 2015-05-24 | Discharge: 2015-05-24 | Disposition: A | Discharge status: Home / Self Care | Payer: Medicare Other | Source: Ambulatory Visit | Attending: Radiation Oncology | Admitting: Radiation Oncology

## 2015-05-24 DIAGNOSIS — Z510 Encounter for antineoplastic radiation therapy: Secondary | ICD-10-CM | POA: Diagnosis not present

## 2015-05-25 ENCOUNTER — Ambulatory Visit
Admission: RE | Admit: 2015-05-25 | Discharge: 2015-05-25 | Disposition: A | Discharge status: Home / Self Care | Payer: Medicare Other | Source: Ambulatory Visit | Attending: Radiation Oncology | Admitting: Radiation Oncology

## 2015-05-25 DIAGNOSIS — Z510 Encounter for antineoplastic radiation therapy: Secondary | ICD-10-CM | POA: Diagnosis not present

## 2015-05-26 ENCOUNTER — Ambulatory Visit
Admission: RE | Admit: 2015-05-26 | Discharge: 2015-05-26 | Disposition: A | Discharge status: Home / Self Care | Payer: Medicare Other | Source: Ambulatory Visit | Attending: Radiation Oncology | Admitting: Radiation Oncology

## 2015-05-26 DIAGNOSIS — Z510 Encounter for antineoplastic radiation therapy: Secondary | ICD-10-CM | POA: Diagnosis not present

## 2015-05-29 ENCOUNTER — Inpatient Hospital Stay: Payer: Medicare Other

## 2015-05-29 ENCOUNTER — Ambulatory Visit
Admission: RE | Admit: 2015-05-29 | Discharge: 2015-05-29 | Disposition: A | Discharge status: Home / Self Care | Payer: Medicare Other | Source: Ambulatory Visit | Attending: Radiation Oncology | Admitting: Radiation Oncology

## 2015-05-29 ENCOUNTER — Inpatient Hospital Stay (HOSPITAL_BASED_OUTPATIENT_CLINIC_OR_DEPARTMENT_OTHER): Payer: Medicare Other | Admitting: Oncology

## 2015-05-29 VITALS — BP 151/92 | HR 80 | Temp 98.2°F | Resp 18 | Wt 172.4 lb

## 2015-05-29 DIAGNOSIS — Z808 Family history of malignant neoplasm of other organs or systems: Secondary | ICD-10-CM

## 2015-05-29 DIAGNOSIS — Z79810 Long term (current) use of selective estrogen receptor modulators (SERMs): Secondary | ICD-10-CM | POA: Diagnosis not present

## 2015-05-29 DIAGNOSIS — I10 Essential (primary) hypertension: Secondary | ICD-10-CM

## 2015-05-29 DIAGNOSIS — F419 Anxiety disorder, unspecified: Secondary | ICD-10-CM

## 2015-05-29 DIAGNOSIS — Z79899 Other long term (current) drug therapy: Secondary | ICD-10-CM

## 2015-05-29 DIAGNOSIS — F329 Major depressive disorder, single episode, unspecified: Secondary | ICD-10-CM

## 2015-05-29 DIAGNOSIS — F819 Developmental disorder of scholastic skills, unspecified: Secondary | ICD-10-CM

## 2015-05-29 DIAGNOSIS — R609 Edema, unspecified: Secondary | ICD-10-CM

## 2015-05-29 DIAGNOSIS — Z803 Family history of malignant neoplasm of breast: Secondary | ICD-10-CM

## 2015-05-29 DIAGNOSIS — D509 Iron deficiency anemia, unspecified: Secondary | ICD-10-CM

## 2015-05-29 DIAGNOSIS — Z8669 Personal history of other diseases of the nervous system and sense organs: Secondary | ICD-10-CM

## 2015-05-29 DIAGNOSIS — C50912 Malignant neoplasm of unspecified site of left female breast: Secondary | ICD-10-CM

## 2015-05-29 DIAGNOSIS — Z170 Estrogen receptor positive status [ER+]: Secondary | ICD-10-CM | POA: Diagnosis not present

## 2015-05-29 DIAGNOSIS — K219 Gastro-esophageal reflux disease without esophagitis: Secondary | ICD-10-CM

## 2015-05-29 DIAGNOSIS — Z510 Encounter for antineoplastic radiation therapy: Secondary | ICD-10-CM | POA: Diagnosis not present

## 2015-05-29 LAB — IRON AND TIBC
IRON: 34 ug/dL (ref 28–170)
Saturation Ratios: 14 % (ref 10.4–31.8)
TIBC: 242 ug/dL — ABNORMAL LOW (ref 250–450)
UIBC: 208 ug/dL

## 2015-05-29 LAB — CBC WITH DIFFERENTIAL/PLATELET
BASOS PCT: 1 %
Basophils Absolute: 0.1 10*3/uL (ref 0–0.1)
EOS PCT: 14 %
Eosinophils Absolute: 0.7 10*3/uL (ref 0–0.7)
HEMATOCRIT: 35.5 % (ref 35.0–47.0)
Hemoglobin: 11.9 g/dL — ABNORMAL LOW (ref 12.0–16.0)
Lymphocytes Relative: 13 %
Lymphs Abs: 0.6 10*3/uL — ABNORMAL LOW (ref 1.0–3.6)
MCH: 26.9 pg (ref 26.0–34.0)
MCHC: 33.5 g/dL (ref 32.0–36.0)
MCV: 80.3 fL (ref 80.0–100.0)
MONO ABS: 0.4 10*3/uL (ref 0.2–0.9)
MONOS PCT: 8 %
NEUTROS ABS: 3.1 10*3/uL (ref 1.4–6.5)
Neutrophils Relative %: 64 %
PLATELETS: 273 10*3/uL (ref 150–440)
RBC: 4.43 MIL/uL (ref 3.80–5.20)
RDW: 19.5 % — AB (ref 11.5–14.5)
WBC: 4.8 10*3/uL (ref 3.6–11.0)

## 2015-05-29 LAB — FERRITIN: Ferritin: 16 ng/mL (ref 11–307)

## 2015-05-29 MED ORDER — TAMOXIFEN CITRATE 20 MG PO TABS: 20.0000 mg | ORAL_TABLET | Freq: Every day | ORAL | Status: DC

## 2015-05-29 NOTE — Progress Notes (Signed)
Patient is noticing easy bruising.  She will be finished with XRT this week

## 2015-05-30 ENCOUNTER — Encounter: Payer: Self-pay | Admitting: Oncology

## 2015-05-30 ENCOUNTER — Ambulatory Visit
Admission: RE | Admit: 2015-05-30 | Discharge: 2015-05-30 | Disposition: A | Discharge status: Home / Self Care | Payer: Medicare Other | Source: Ambulatory Visit | Attending: Radiation Oncology | Admitting: Radiation Oncology

## 2015-05-30 DIAGNOSIS — Z510 Encounter for antineoplastic radiation therapy: Secondary | ICD-10-CM | POA: Diagnosis not present

## 2015-05-31 ENCOUNTER — Ambulatory Visit
Admission: RE | Admit: 2015-05-31 | Discharge: 2015-05-31 | Disposition: A | Discharge status: Home / Self Care | Payer: Medicare Other | Source: Ambulatory Visit | Attending: Radiation Oncology | Admitting: Radiation Oncology

## 2015-05-31 DIAGNOSIS — Z510 Encounter for antineoplastic radiation therapy: Secondary | ICD-10-CM | POA: Diagnosis not present

## 2015-06-01 ENCOUNTER — Ambulatory Visit
Admission: RE | Admit: 2015-06-01 | Discharge: 2015-06-01 | Disposition: A | Discharge status: Home / Self Care | Payer: Medicare Other | Source: Ambulatory Visit | Attending: Radiation Oncology | Admitting: Radiation Oncology

## 2015-06-01 ENCOUNTER — Ambulatory Visit: Payer: Medicare Other

## 2015-06-01 DIAGNOSIS — Z510 Encounter for antineoplastic radiation therapy: Secondary | ICD-10-CM | POA: Diagnosis not present

## 2015-06-02 ENCOUNTER — Ambulatory Visit
Admission: RE | Admit: 2015-06-02 | Discharge: 2015-06-02 | Disposition: A | Discharge status: Home / Self Care | Payer: Medicare Other | Source: Ambulatory Visit | Attending: Radiation Oncology | Admitting: Radiation Oncology

## 2015-06-02 DIAGNOSIS — Z510 Encounter for antineoplastic radiation therapy: Secondary | ICD-10-CM | POA: Diagnosis not present

## 2015-06-05 ENCOUNTER — Encounter: Payer: Self-pay | Admitting: *Deleted

## 2015-06-05 ENCOUNTER — Encounter: Payer: Self-pay | Admitting: General Surgery

## 2015-06-05 ENCOUNTER — Ambulatory Visit (INDEPENDENT_AMBULATORY_CARE_PROVIDER_SITE_OTHER): Payer: Medicare Other | Admitting: General Surgery

## 2015-06-05 VITALS — BP 126/68 | HR 90 | Resp 14 | Ht 62.0 in | Wt 170.0 lb

## 2015-06-05 DIAGNOSIS — C50911 Malignant neoplasm of unspecified site of right female breast: Secondary | ICD-10-CM

## 2015-06-05 NOTE — Patient Instructions (Signed)
Mammogram Right breast in February 2017. Call with any changes or concerns.
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
## 2015-06-17 NOTE — Progress Notes (Signed)
[facility]  Telephone:(336) [phone] Fax:(336) [phone]  ID: LANIE SCHELLING OB: [birth year]  MR#: [id number]  ILV#:[id number]  Patient Care Team: Jerl Mina, MD as PCP - General (Family Medicine) Jerl Mina, MD as Referring Physician (Family Medicine) Seeplaputhur Wynona Luna, MD (General Surgery)  CHIEF COMPLAINT:  Chief Complaint  Patient presents with  . Breast Cancer  . Anemia    INTERVAL HISTORY: Patient returns to clinic today for further evaluation and initiation of tamoxifen. She is tolerating her XRT well. She only complains of mild bruising today. She continues to be anxious. She has no neurologic complaints. She denies any recent fevers. She has a good appetite and denies weight loss. She denies any chest pain or shortness of breath. She denies any nausea, vomiting, constipation, or diarrhea. Patient offers no further specific complaints.  REVIEW OF SYSTEMS:   Review of Systems  Constitutional: Negative for fever and malaise/fatigue.  Respiratory: Negative.   Cardiovascular: Positive for leg swelling.  Gastrointestinal: Negative for blood in stool and melena.  Musculoskeletal: Negative.   Neurological: Negative.  Negative for weakness.  Psychiatric/Behavioral: The patient is nervous/anxious.     As per HPI. Otherwise, a complete review of systems is negatve.  PAST MEDICAL HISTORY: Past Medical History  Diagnosis Date  . Depression   . Migraine   . GERD (gastroesophageal reflux disease)   . Seizures (HCC)     AS A CHILD-NONE SINCE  . Edema of both legs   . Learning disability   . Hypertension     RECENTLY TAKEN OFF OF MEDS PER MOM   . Cancer (HCC) 01-26-15    INVASIVE MAMMARY CARCINOMA and DCIS    PAST SURGICAL HISTORY: Past Surgical History  Procedure Laterality Date  . Axillary hidradenitis excision      unc  . Inguinal hidradenitis excision    . Carpal tunnel release    . Breast ductal system excision Bilateral 01/26/2015   Procedure: BILATERAL EXCISION DUCTAL SYSTEM BREAST;  Surgeon: Kieth Brightly, MD;  Location: [facility] ORS;  Service: General;  Laterality: Bilateral;  . Breast lumpectomy Right 02/06/2015    Procedure: BREAST LUMPECTOMY;  Surgeon: Kieth Brightly, MD;  Location: [facility] ORS;  Service: General;  Laterality: Right;  . Axillary sentinel node biopsy Right 02/23/2015    Procedure: AXILLARY SN BIOPSY;  Dr Evette Cristal, MD;  [facility] ORS;  ISOLATED TUMOR CELLS IN ONE LYMPH NODE (1/1).    FAMILY HISTORY Family History  Problem Relation Age of Onset  . Breast cancer Sister 107  . Testicular cancer Father        ADVANCED DIRECTIVES:    HEALTH MAINTENANCE: Social History  Substance Use Topics  . Smoking status: Never Smoker   . Smokeless tobacco: Never Used  . Alcohol Use: No     Colonoscopy:  PAP:  Bone density:  Lipid panel:  Allergies  Allergen Reactions  . Cephalexin Nausea And Vomiting  . Chocolate Swelling  . Corn Oil Other (See Comments)  . Peanut Oil Other (See Comments)  . Phenobarbital Other (See Comments)  . Sumatriptan Other (See Comments)  . Vilazodone Hcl Nausea Only  . Duloxetine Hcl Anxiety    agitation  . Latex Rash  . Tape Rash    Current Outpatient Prescriptions  Medication Sig Dispense Refill  . acetaminophen (TYLENOL) 500 MG tablet Take 500 mg by mouth as needed.    Marland Kitchen aspirin-acetaminophen-caffeine (EXCEDRIN MIGRAINE) 250-250-65 MG per tablet Take 1 tablet by mouth every 6 (six) hours as  needed for headache.    Marland Kitchen aspirin-sod bicarb-citric acid (ALKA-SELTZER) 325 MG TBEF tablet Take 325 mg by mouth every 6 (six) hours as needed.    . calcium carbonate (TUMS - DOSED IN MG ELEMENTAL CALCIUM) 500 MG chewable tablet Chew 1 tablet by mouth daily.    . cetirizine (ZYRTEC) 10 MG tablet Take 10 mg by mouth daily as needed for allergies.    . furosemide (LASIX) 20 MG tablet Take by mouth.    . tamoxifen (NOLVADEX) 20 MG tablet Take 1 tablet (20 mg total) by mouth daily.  30 tablet 11   No current facility-administered medications for this visit.    OBJECTIVE: Filed Vitals:   05/29/15 1041  BP: 151/92  Pulse: 80  Temp: 98.2 F (36.8 C)  Resp: 18     Body mass index is 31.52 kg/(m^2).    ECOG FS:0 - Asymptomatic  General: Well-developed, well-nourished, no acute distress. Eyes: Pink conjunctiva, anicteric sclera. Breasts: Patient request exam deferred today. Lungs: Clear to auscultation bilaterally. Heart: Regular rate and rhythm. No rubs, murmurs, or gallops. Abdomen: Soft, nontender, nondistended. No organomegaly noted, normoactive bowel sounds. Musculoskeletal: No edema, cyanosis, or clubbing. Neuro: Alert, answering all questions appropriately. Cranial nerves grossly intact. Skin: No rashes or petechiae noted. Psych: Normal affect.   LAB RESULTS:  Lab Results  Component Value Date   NA 142 02/14/2015   K 4.3 02/14/2015   CL 104 02/14/2015   CO2 23 02/14/2015   GLUCOSE 126* 02/14/2015   BUN 13 02/14/2015   CREATININE 0.75 02/14/2015   CALCIUM 8.3* 02/14/2015   PROT 5.4* 02/14/2015   ALBUMIN 3.5 02/14/2015   AST 13 02/14/2015   ALT 7 02/14/2015   ALKPHOS 50 02/14/2015   BILITOT 0.4 02/14/2015   GFRNONAA 97 02/14/2015   GFRAA 111 02/14/2015    Lab Results  Component Value Date   WBC 4.8 05/29/2015   NEUTROABS 3.1 05/29/2015   HGB 11.9* 05/29/2015   HCT 35.5 05/29/2015   MCV 80.3 05/29/2015   PLT 273 05/29/2015     STUDIES: No results found.  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  PLAN:    1. Breast cancer: Patient will complete adjuvant XRT next week and was given a prescription of tamoxifen which she will require to take daily for 5 years completing in December 2021. Return to clinic in 3 months for routine evaluation.  2. Edema/anasarca: Improving. Cardiac echo revealed EF of 65-70%. Patient's kidney and liver function  are also within normal limits. 3. Heavy menses: Improved. Patient was previously given a referral to gynecology. 4. Iron deficiency anemia: Patient's hemoglobin and iron stores are within normal limits. She does not require additional IV Feraheme today. Repeat iron stores in 3 months.   Patient expressed understanding and was in agreement with this plan. She also understands that She can call clinic at any time with any questions, concerns, or complaints.    Lloyd Huger, MD   06/17/2015 7:35 AM

## 2015-07-06 ENCOUNTER — Ambulatory Visit
Admission: RE | Admit: 2015-07-06 | Discharge: 2015-07-06 | Disposition: A | Discharge status: Home / Self Care | Payer: Medicare Other | Source: Ambulatory Visit | Attending: Radiation Oncology | Admitting: Radiation Oncology

## 2015-07-06 ENCOUNTER — Encounter: Payer: Self-pay | Admitting: Radiation Oncology

## 2015-07-06 VITALS — BP 143/93 | HR 86 | Temp 98.5°F | Resp 18 | Wt 168.1 lb

## 2015-07-06 DIAGNOSIS — C50911 Malignant neoplasm of unspecified site of right female breast: Secondary | ICD-10-CM

## 2015-07-06 NOTE — Progress Notes (Signed)
Radiation Oncology Follow up Note  Name: Karen Beard   Date:   07/06/2015 MRN:  [id number] DOB: 12/10/1970    This 45 y.o. female presents to the clinic today for follow-up for breast cancer. Stage I status post wide local excision and sentinel node biopsy and adjuvant whole breast radiation therapy to her right breast  REFERRING PROVIDER: Maryland Pink, MD  HPI: Patient is a 45 year old female now out 1 month having completed radiation therapy to her right breast for stage TI N0 invasive mammary carcinoma the right breast as was wide local excision and sentinel node biopsy.. She is seen today in routine follow-up and is doing well. She is on tamoxifen tolerating that well without side effect. She specifically denies breast tenderness cough or bone pain.  COMPLICATIONS OF TREATMENT: none  FOLLOW UP COMPLIANCE: keeps appointments   PHYSICAL EXAM:  BP 143/93 mmHg  Pulse 86  Temp(Src) 98.5 F (36.9 C)  Resp 18  Wt 168 lb 1.6 oz (76.25 kg) Lungs are clear to A&P cardiac examination essentially unremarkable with regular rate and rhythm. No dominant mass or nodularity is noted in either breast in 2 positions examined. Incision is well-healed. No axillary or supraclavicular adenopathy is appreciated. Cosmetic result is excellent. There is some slight shrinkage of the right breast although cosmetic result is still good to excellent. Also slight hyperpigmentation the skin remains. Well-developed well-nourished patient in NAD. HEENT reveals PERLA, EOMI, discs not visualized.  Oral cavity is clear. No oral mucosal lesions are identified. Neck is clear without evidence of cervical or supraclavicular adenopathy. Lungs are clear to A&P. Cardiac examination is essentially unremarkable with regular rate and rhythm without murmur rub or thrill. Abdomen is benign with no organomegaly or masses noted. Motor sensory and DTR levels are equal and symmetric in the upper and lower extremities. Cranial nerves  II through XII are grossly intact. Proprioception is intact. No peripheral adenopathy or edema is identified. No motor or sensory levels are noted. Crude visual fields are within normal range.  RADIOLOGY RESULTS: No current films for review  PLAN: At the present time she is doing well 1 month out of whole breast radiation. She's currently on tamoxifen tolerating that well without side effect. I am please were overall progress. I have asked to see her back in 4-5 months for follow-up. She knows to call sooner with any concerns.  I would like to take this opportunity for allowing me to participate in the care of your patient.Armstead Peaks., MD

## 2015-07-19 ENCOUNTER — Ambulatory Visit
Admission: RE | Admit: 2015-07-19 | Discharge: 2015-07-19 | Disposition: A | Discharge status: Home / Self Care | Payer: Medicare Other | Source: Ambulatory Visit | Attending: General Surgery | Admitting: General Surgery

## 2015-07-19 DIAGNOSIS — Z853 Personal history of malignant neoplasm of breast: Secondary | ICD-10-CM | POA: Diagnosis present

## 2015-07-19 DIAGNOSIS — C50911 Malignant neoplasm of unspecified site of right female breast: Secondary | ICD-10-CM

## 2015-07-19 DIAGNOSIS — Z9889 Other specified postprocedural states: Secondary | ICD-10-CM | POA: Insufficient documentation

## 2015-07-19 DIAGNOSIS — Z923 Personal history of irradiation: Secondary | ICD-10-CM | POA: Diagnosis not present

## 2015-07-19 HISTORY — DX: Malignant neoplasm of unspecified site of unspecified female breast: C50.919

## 2015-07-27 ENCOUNTER — Encounter: Payer: Self-pay | Admitting: General Surgery

## 2015-07-27 ENCOUNTER — Ambulatory Visit (INDEPENDENT_AMBULATORY_CARE_PROVIDER_SITE_OTHER): Payer: Medicare Other | Admitting: General Surgery

## 2015-07-27 VITALS — BP 142/82 | HR 76 | Resp 12 | Ht 64.0 in | Wt 169.0 lb

## 2015-07-27 DIAGNOSIS — C50011 Malignant neoplasm of nipple and areola, right female breast: Secondary | ICD-10-CM | POA: Diagnosis not present

## 2015-07-27 DIAGNOSIS — D0511 Intraductal carcinoma in situ of right breast: Secondary | ICD-10-CM

## 2015-07-27 NOTE — Patient Instructions (Addendum)
Patient aware to call back with any questions. Continue monthly self breast exams. Return in 4 months with bilateral diagnostic mammogram.

## 2015-07-27 NOTE — Progress Notes (Signed)
Patient ID: Karen Beard, female   DOB: 1970-11-04, 45 y.o.   MRN: [id number]  Chief Complaint  Patient presents with  . Follow-up    mammogram    HPI Karen Beard is a 45 y.o. female who presents for a breast evaluation and follow-up breast cancer. The most recent mammogram was done on 07/19/15 .  Patient does perform regular self breast checks and gets regular mammograms done. No new breast changes. She is tolerating tomoxifen. I have reviewed the history of present illness with the patient. HPI  Past Medical History  Diagnosis Date  . Depression   . Migraine   . GERD (gastroesophageal reflux disease)   . Seizures (HCC)     AS A CHILD-NONE SINCE  . Edema of both legs   . Learning disability   . Hypertension     RECENTLY TAKEN OFF OF MEDS PER MOM   . 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 Breast cancer ([place]) 01/2015    right breast lumpectomy with rad tx    Past Surgical History  Procedure Laterality Date  . Axillary hidradenitis excision      unc  . Inguinal hidradenitis excision    . Carpal tunnel release    . Breast ductal system excision Bilateral 01/26/2015    Procedure: BILATERAL EXCISION DUCTAL SYSTEM BREAST;  Surgeon: Christene Lye, MD;  Location: [facility] ORS;  Service: General;  Laterality: Bilateral;  . Breast lumpectomy Right 02/06/2015    Procedure: BREAST LUMPECTOMY;  Surgeon: Christene Lye, MD;  Location: [facility] ORS;  Service: General;  Laterality: Right;  . Axillary sentinel node biopsy Right 02/23/2015    Procedure: AXILLARY SN BIOPSY;  Dr Jamal Collin, MD;  [facility];  ISOLATED TUMOR CELLS IN ONE LYMPH NODE (1/1).  Marland Kitchen Excision / biopsy breast / nipple / duct Bilateral 01/26/2015    left breast negative. right breast positive    Family History  Problem Relation Age of Onset  . Breast cancer Sister 58  . Testicular cancer Father     Social History Social History  Substance Use Topics  . Smoking status:  Never Smoker   . Smokeless tobacco: Never Used  . Alcohol Use: No    Allergies  Allergen Reactions  . Cephalexin Nausea And Vomiting  . Chocolate Swelling  . Corn Oil Other (See Comments)  . Peanut Oil Other (See Comments)  . Phenobarbital Other (See Comments)  . Sumatriptan Other (See Comments)  . Vilazodone Hcl Nausea Only  . Duloxetine Hcl Anxiety    agitation  . Latex Rash  . Tape Rash    Current Outpatient Prescriptions  Medication Sig Dispense Refill  . cetirizine (ZYRTEC) 10 MG tablet Take 10 mg by mouth daily as needed for allergies.    . tamoxifen (NOLVADEX) 20 MG tablet Take 1 tablet (20 mg total) by mouth daily. 30 tablet 11  . acetaminophen (TYLENOL) 500 MG tablet Take 500 mg by mouth as needed. Reported on 07/27/2015    . aspirin-acetaminophen-caffeine (EXCEDRIN MIGRAINE) 250-250-65 MG per tablet Take 1 tablet by mouth every 6 (six) hours as needed for headache. Reported on 07/27/2015    . aspirin-sod bicarb-citric acid (ALKA-SELTZER) 325 MG TBEF tablet Take 325 mg by mouth every 6 (six) hours as needed. Reported on 07/27/2015    . calcium carbonate (TUMS - DOSED IN MG ELEMENTAL CALCIUM) 500 MG chewable tablet Chew 1 tablet by mouth daily. Reported on 07/27/2015    .  furosemide (LASIX) 20 MG tablet Take by mouth. Reported on 07/27/2015     No current facility-administered medications for this visit.    Review of Systems Review of Systems  Constitutional: Negative.   Respiratory: Negative.   Cardiovascular: Negative.     Blood pressure 142/82, pulse 76, resp. rate 12, height 5\' 4"  (1.626 m), weight 169 lb (76.658 kg), last menstrual period 06/28/2015.  Physical Exam Physical Exam  Constitutional: She is oriented to person, place, and time. She appears well-developed and well-nourished.  Eyes: Conjunctivae are normal. No scleral icterus.  Neck: Neck supple.  Cardiovascular: Normal rate, regular rhythm and normal heart sounds.   Pulmonary/Chest: Effort normal and  breath sounds normal. Right breast exhibits no mass, no skin change and no tenderness. Left breast exhibits no inverted nipple, no mass, no nipple discharge, no skin change and no tenderness.    Rightt breast- Minimal radiational skin changes, nipple-subareolar complex is absent, well-healed incision    Abdominal: Soft. Bowel sounds are normal. There is no hepatomegaly. There is no tenderness. No hernia.  Lymphadenopathy:    She has no cervical adenopathy.    She has no axillary adenopathy.  Neurological: She is alert and oriented to person, place, and time.  Skin: Skin is warm and dry.    Data Reviewed Mammogram reviewed and stable post surgical changes  Assessment    DCIS right breast with invasive component, 6 months out from lumpectomy, SN biopsy and radiation. Currently on Tamoxifen and doing well     Plan    Patient to follow-up in 5 months with bilateral diagnostic mammogram    PCP:  Kary Kos This information has been scribed by Gaspar Cola CMA.     Denika Krone G 07/27/2015, 9:57 AM

## 2015-08-28 ENCOUNTER — Inpatient Hospital Stay: Payer: Medicare Other

## 2015-08-28 ENCOUNTER — Inpatient Hospital Stay: Payer: Medicare Other | Attending: Oncology | Admitting: Oncology

## 2015-08-28 VITALS — BP 142/94 | HR 87 | Temp 98.1°F | Resp 16 | Wt 164.7 lb

## 2015-08-28 DIAGNOSIS — Z7982 Long term (current) use of aspirin: Secondary | ICD-10-CM | POA: Insufficient documentation

## 2015-08-28 DIAGNOSIS — Z170 Estrogen receptor positive status [ER+]: Secondary | ICD-10-CM | POA: Diagnosis not present

## 2015-08-28 DIAGNOSIS — R61 Generalized hyperhidrosis: Secondary | ICD-10-CM

## 2015-08-28 DIAGNOSIS — F329 Major depressive disorder, single episode, unspecified: Secondary | ICD-10-CM | POA: Insufficient documentation

## 2015-08-28 DIAGNOSIS — R601 Generalized edema: Secondary | ICD-10-CM | POA: Diagnosis not present

## 2015-08-28 DIAGNOSIS — D509 Iron deficiency anemia, unspecified: Secondary | ICD-10-CM

## 2015-08-28 DIAGNOSIS — K219 Gastro-esophageal reflux disease without esophagitis: Secondary | ICD-10-CM | POA: Insufficient documentation

## 2015-08-28 DIAGNOSIS — Z79810 Long term (current) use of selective estrogen receptor modulators (SERMs): Secondary | ICD-10-CM | POA: Diagnosis not present

## 2015-08-28 DIAGNOSIS — I10 Essential (primary) hypertension: Secondary | ICD-10-CM | POA: Diagnosis not present

## 2015-08-28 DIAGNOSIS — Z923 Personal history of irradiation: Secondary | ICD-10-CM | POA: Diagnosis not present

## 2015-08-28 DIAGNOSIS — C50911 Malignant neoplasm of unspecified site of right female breast: Secondary | ICD-10-CM

## 2015-08-28 LAB — IRON AND TIBC
IRON: 45 ug/dL (ref 28–170)
Saturation Ratios: 17 % (ref 10.4–31.8)
TIBC: 268 ug/dL (ref 250–450)
UIBC: 223 ug/dL

## 2015-08-28 LAB — FERRITIN: FERRITIN: 12 ng/mL (ref 11–307)

## 2015-08-28 LAB — CBC WITH DIFFERENTIAL/PLATELET
Basophils Absolute: 0.1 10*3/uL (ref 0–0.1)
Basophils Relative: 1 %
EOS ABS: 0.4 10*3/uL (ref 0–0.7)
Eosinophils Relative: 8 %
HEMATOCRIT: 36.3 % (ref 35.0–47.0)
HEMOGLOBIN: 12.4 g/dL (ref 12.0–16.0)
LYMPHS ABS: 0.9 10*3/uL — AB (ref 1.0–3.6)
LYMPHS PCT: 17 %
MCH: 27.5 pg (ref 26.0–34.0)
MCHC: 34.1 g/dL (ref 32.0–36.0)
MCV: 80.5 fL (ref 80.0–100.0)
MONOS PCT: 6 %
Monocytes Absolute: 0.3 10*3/uL (ref 0.2–0.9)
NEUTROS ABS: 3.4 10*3/uL (ref 1.4–6.5)
NEUTROS PCT: 68 %
Platelets: 215 10*3/uL (ref 150–440)
RBC: 4.51 MIL/uL (ref 3.80–5.20)
RDW: 13.4 % (ref 11.5–14.5)
WBC: 5 10*3/uL (ref 3.6–11.0)

## 2015-08-28 NOTE — Progress Notes (Signed)
Patient has bilateral hand pain and numbness that radiates up arms that started 3 weeks ago.

## 2015-09-03 NOTE — Progress Notes (Signed)
[facility]  [phone] [phone]  ID: Karen Beard OB: 1970-06-18  MR#: [id number]  MLZ#:[id number]  Patient Care Team: Jerl Mina, MD as PCP - General (Family Medicine) Jerl Mina, MD as Referring Physician (Family Medicine) Seeplaputhur Wynona Luna, MD (General Surgery)  CHIEF COMPLAINT:  Chief Complaint  Patient presents with  . Anemia  . Breast Cancer    INTERVAL HISTORY: Patient returns to clinic today for laboratory work and further evaluation. She is tolerating tamoxifen well without significant side effects. In the past several weeks she has noted bilateral hand numbness that radiates slightly upper arm, but otherwise feels well. She continues to be anxious. She has no neurologic complaints. She denies any recent fevers. She has a good appetite and denies weight loss. She denies any chest pain or shortness of breath. She denies any nausea, vomiting, constipation, or diarrhea. Patient offers no further specific complaints.  REVIEW OF SYSTEMS:   Review of Systems  Constitutional: Negative for fever and malaise/fatigue.  Respiratory: Negative.   Cardiovascular: Negative.  Negative for chest pain and leg swelling.  Gastrointestinal: Negative.  Negative for blood in stool and melena.  Musculoskeletal: Negative.   Neurological: Positive for sensory change. Negative for weakness.  Psychiatric/Behavioral: The patient is nervous/anxious.     As per HPI. Otherwise, a complete review of systems is negatve.  PAST MEDICAL HISTORY: Past Medical History  Diagnosis Date  . Depression   . Migraine   . GERD (gastroesophageal reflux disease)   . Seizures (HCC)     AS A CHILD-NONE SINCE  . Edema of both legs   . Learning disability   . Hypertension     RECENTLY TAKEN OFF OF MEDS PER MOM   . 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 Breast cancer (HCC) 01/2015    right breast lumpectomy with rad tx     PAST SURGICAL HISTORY: Past Surgical History  Procedure Laterality Date  . Axillary hidradenitis excision      unc  . Inguinal hidradenitis excision    . Carpal tunnel release    . Breast ductal system excision Bilateral 01/26/2015    Procedure: BILATERAL EXCISION DUCTAL SYSTEM BREAST;  Surgeon: Kieth Brightly, MD;  Location: [facility] ORS;  Service: General;  Laterality: Bilateral;  . Breast lumpectomy Right 02/06/2015    Procedure: BREAST LUMPECTOMY;  Surgeon: Kieth Brightly, MD;  Location: [facility] ORS;  Service: General;  Laterality: Right;  . Axillary sentinel node biopsy Right 02/23/2015    Procedure: AXILLARY SN BIOPSY;  Dr Evette Cristal, MD;  [facility];  ISOLATED TUMOR CELLS IN ONE LYMPH NODE (1/1).  Marland Kitchen Excision / biopsy breast / nipple / duct Bilateral 01/26/2015    left breast negative. right breast positive    FAMILY HISTORY Family History  Problem Relation Age of Onset  . Breast cancer Sister 42  . Testicular cancer Father        ADVANCED DIRECTIVES:    HEALTH MAINTENANCE: Social History  Substance Use Topics  . Smoking status: Never Smoker   . Smokeless tobacco: Never Used  . Alcohol Use: No     Colonoscopy:  PAP:  Bone density:  Lipid panel:  Allergies  Allergen Reactions  . Cephalexin Nausea And Vomiting  . Chocolate Swelling  . Corn Oil Other (See Comments)  . Peanut Oil Other (See Comments)  . Phenobarbital Other (See Comments)  . Sumatriptan Other (See Comments)  . Vilazodone Hcl  Nausea Only  . Duloxetine Hcl Anxiety    agitation  . Latex Rash  . Tape Rash    Current Outpatient Prescriptions  Medication Sig Dispense Refill  . acetaminophen (TYLENOL) 500 MG tablet Take 500 mg by mouth as needed. Reported on 07/27/2015    . aspirin-acetaminophen-caffeine (EXCEDRIN MIGRAINE) 250-250-65 MG per tablet Take 1 tablet by mouth every 6 (six) hours as needed for headache. Reported on 07/27/2015    . cetirizine (ZYRTEC) 10 MG tablet Take 10 mg by mouth  daily as needed for allergies.    . tamoxifen (NOLVADEX) 20 MG tablet Take 1 tablet (20 mg total) by mouth daily. 30 tablet 11   No current facility-administered medications for this visit.    OBJECTIVE: Filed Vitals:   08/28/15 1022  BP: 142/94  Pulse: 87  Temp: 98.1 F (36.7 C)  Resp: 16     Body mass index is 28.25 kg/(m^2).    ECOG FS:0 - Asymptomatic  General: Well-developed, well-nourished, no acute distress. Eyes: Pink conjunctiva, anicteric sclera. Breasts: Patient request exam deferred today. Lungs: Clear to auscultation bilaterally. Heart: Regular rate and rhythm. No rubs, murmurs, or gallops. Abdomen: Soft, nontender, nondistended. No organomegaly noted, normoactive bowel sounds. Musculoskeletal: No edema, cyanosis, or clubbing. Neuro: Alert, answering all questions appropriately. Cranial nerves grossly intact. Skin: No rashes or petechiae noted. Psych: Normal affect.   LAB RESULTS:  Lab Results  Component Value Date   NA 142 02/14/2015   K 4.3 02/14/2015   CL 104 02/14/2015   CO2 23 02/14/2015   GLUCOSE 126* 02/14/2015   BUN 13 02/14/2015   CREATININE 0.75 02/14/2015   CALCIUM 8.3* 02/14/2015   PROT 5.4* 02/14/2015   ALBUMIN 3.5 02/14/2015   AST 13 02/14/2015   ALT 7 02/14/2015   ALKPHOS 50 02/14/2015   BILITOT 0.4 02/14/2015   GFRNONAA 97 02/14/2015   GFRAA 111 02/14/2015    Lab Results  Component Value Date   WBC 5.0 08/28/2015   NEUTROABS 3.4 08/28/2015   HGB 12.4 08/28/2015   HCT 36.3 08/28/2015   MCV 80.5 08/28/2015   PLT 215 08/28/2015     STUDIES: No results found.  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  PLAN:    1. Breast cancer: No evidence of disease. Patient's most recent mammogram on July 19, 2015 was reported as BI-RADS 2. Continue tamoxifen taking daily 5 years completing in December 2021. Return to clinic in 3 months for  routine evaluation.  2. Edema/anasarca: Improving. Cardiac echo revealed EF of 65-70%. Patient's kidney and liver function are also within normal limits. 3. Heavy menses: Improved. Patient was previously given a referral to gynecology. 4. Iron deficiency anemia: Patient's hemoglobin and iron stores are within normal limits. She does not require additional IV Feraheme today. Repeat iron stores in 3 months.  5. Hand numbness: Unclear etiology. Likely unrelated to tamoxifen. Patient states she has follow-up with her primary care physician in the near future.  Patient expressed understanding and was in agreement with this plan. She also understands that She can call clinic at any time with any questions, concerns, or complaints.    Lloyd Huger, MD   09/03/2015 9:29 AM

## 2015-09-08 ENCOUNTER — Emergency Department: Payer: Medicare Other

## 2015-09-08 ENCOUNTER — Emergency Department
Admission: EM | Admit: 2015-09-08 | Discharge: 2015-09-08 | Disposition: A | Discharge status: Home / Self Care | Payer: Medicare Other | Attending: Emergency Medicine | Admitting: Emergency Medicine

## 2015-09-08 DIAGNOSIS — K5901 Slow transit constipation: Secondary | ICD-10-CM | POA: Diagnosis not present

## 2015-09-08 DIAGNOSIS — Z79899 Other long term (current) drug therapy: Secondary | ICD-10-CM | POA: Diagnosis not present

## 2015-09-08 DIAGNOSIS — F329 Major depressive disorder, single episode, unspecified: Secondary | ICD-10-CM | POA: Diagnosis not present

## 2015-09-08 DIAGNOSIS — N83209 Unspecified ovarian cyst, unspecified side: Secondary | ICD-10-CM

## 2015-09-08 DIAGNOSIS — I10 Essential (primary) hypertension: Secondary | ICD-10-CM | POA: Insufficient documentation

## 2015-09-08 DIAGNOSIS — N83291 Other ovarian cyst, right side: Secondary | ICD-10-CM | POA: Diagnosis not present

## 2015-09-08 DIAGNOSIS — Z7982 Long term (current) use of aspirin: Secondary | ICD-10-CM | POA: Insufficient documentation

## 2015-09-08 DIAGNOSIS — R569 Unspecified convulsions: Secondary | ICD-10-CM | POA: Insufficient documentation

## 2015-09-08 DIAGNOSIS — Z853 Personal history of malignant neoplasm of breast: Secondary | ICD-10-CM | POA: Insufficient documentation

## 2015-09-08 DIAGNOSIS — R1031 Right lower quadrant pain: Secondary | ICD-10-CM | POA: Insufficient documentation

## 2015-09-08 LAB — CBC
HEMATOCRIT: 39.1 % (ref 35.0–47.0)
HEMOGLOBIN: 13.2 g/dL (ref 12.0–16.0)
MCH: 26.9 pg (ref 26.0–34.0)
MCHC: 33.7 g/dL (ref 32.0–36.0)
MCV: 79.8 fL — ABNORMAL LOW (ref 80.0–100.0)
Platelets: 306 10*3/uL (ref 150–440)
RBC: 4.91 MIL/uL (ref 3.80–5.20)
RDW: 13.7 % (ref 11.5–14.5)
WBC: 6.3 10*3/uL (ref 3.6–11.0)

## 2015-09-08 LAB — COMPREHENSIVE METABOLIC PANEL
ALBUMIN: 3.5 g/dL (ref 3.5–5.0)
ALK PHOS: 42 U/L (ref 38–126)
ALT: 10 U/L — AB (ref 14–54)
AST: 19 U/L (ref 15–41)
Anion gap: 5 (ref 5–15)
BILIRUBIN TOTAL: 0.4 mg/dL (ref 0.3–1.2)
BUN: 20 mg/dL (ref 6–20)
CALCIUM: 8.5 mg/dL — AB (ref 8.9–10.3)
CO2: 25 mmol/L (ref 22–32)
Chloride: 105 mmol/L (ref 101–111)
Creatinine, Ser: 0.68 mg/dL (ref 0.44–1.00)
GFR calc Af Amer: >60 mL/min (ref >60)
GFR calc non Af Amer: >60 mL/min (ref >60)
GLUCOSE: 86 mg/dL (ref 65–99)
Potassium: 4 mmol/L (ref 3.5–5.1)
Sodium: 135 mmol/L (ref 135–145)
TOTAL PROTEIN: 6.8 g/dL (ref 6.5–8.1)

## 2015-09-08 LAB — URINALYSIS COMPLETE WITH MICROSCOPIC (ARMC ONLY)
Bilirubin Urine: NEGATIVE
GLUCOSE, UA: NEGATIVE mg/dL
HGB URINE DIPSTICK: NEGATIVE
Ketones, ur: NEGATIVE mg/dL
Leukocytes, UA: NEGATIVE
Nitrite: NEGATIVE
PROTEIN: NEGATIVE mg/dL
Specific Gravity, Urine: 1.025 (ref 1.005–1.030)
pH: 6 (ref 5.0–8.0)

## 2015-09-08 LAB — POCT PREGNANCY, URINE: PREG TEST UR: NEGATIVE

## 2015-09-08 LAB — LIPASE, BLOOD: Lipase: 34 U/L (ref 11–51)

## 2015-09-08 MED ORDER — POLYETHYLENE GLYCOL 3350 17 G PO PACK: 17.0000 g | PACK | Freq: Every day | ORAL | Status: DC

## 2015-09-08 MED ORDER — ONDANSETRON HCL 4 MG/2ML IJ SOLN
4.0000 mg | Freq: Once | INTRAMUSCULAR | Status: AC
  Administered 2015-09-08: 4 mg via INTRAVENOUS
  Filled 2015-09-08: qty 2

## 2015-09-08 MED ORDER — DIATRIZOATE MEGLUMINE & SODIUM 66-10 % PO SOLN
15.0000 mL | Freq: Once | ORAL | Status: AC
  Administered 2015-09-08: 15 mL via ORAL
  Filled 2015-09-08: qty 30

## 2015-09-08 MED ORDER — IOPAMIDOL (ISOVUE-300) INJECTION 61%
100.0000 mL | Freq: Once | INTRAVENOUS | Status: AC | PRN
  Administered 2015-09-08: 100 mL via INTRAVENOUS
  Filled 2015-09-08: qty 100

## 2015-09-08 MED ORDER — MORPHINE SULFATE (PF) 4 MG/ML IV SOLN
4.0000 mg | Freq: Once | INTRAVENOUS | Status: AC
  Administered 2015-09-08: 4 mg via INTRAVENOUS
  Filled 2015-09-08: qty 1

## 2015-09-08 NOTE — ED Provider Notes (Signed)
[facility] Emergency Department Provider Note  ____________________________________________    I have reviewed the triage vital signs and the nursing notes.   HISTORY  Chief Complaint Abdominal Pain    HPI Karen Beard is a 45 y.o. female who presents with abdominal pain. Patient is here with her mother who helps provide history as patient has a learning disability. Patient reports nearly a week of right lower quadrant pain that has been constant. She denies nausea or vomiting. No fevers. No multiple stools. She has never had this before. No history of abdominal surgery. No dysuria     Past Medical History  Diagnosis Date  . Depression   . Migraine   . GERD (gastroesophageal reflux disease)   . Seizures (HCC)     AS A CHILD-NONE SINCE  . Edema of both legs   . Learning disability   . Hypertension     RECENTLY TAKEN OFF OF MEDS PER MOM   . 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 Breast cancer ([place]) 01/2015    right breast lumpectomy with rad tx    Patient Active Problem List   Diagnosis Date Noted  . Breast cancer ([place]) 03/15/2015  . Iron deficiency anemia 03/07/2015  . Family history of breast cancer 01/17/2015    Past Surgical History  Procedure Laterality Date  . Axillary hidradenitis excision      unc  . Inguinal hidradenitis excision    . Carpal tunnel release    . Breast ductal system excision Bilateral 01/26/2015    Procedure: BILATERAL EXCISION DUCTAL SYSTEM BREAST;  Surgeon: Christene Lye, MD;  Location: [facility] ORS;  Service: General;  Laterality: Bilateral;  . Breast lumpectomy Right 02/06/2015    Procedure: BREAST LUMPECTOMY;  Surgeon: Christene Lye, MD;  Location: [facility] ORS;  Service: General;  Laterality: Right;  . Axillary sentinel node biopsy Right 02/23/2015    Procedure: AXILLARY SN BIOPSY;  Dr Jamal Collin, MD;  [facility] ORS;  ISOLATED TUMOR CELLS IN ONE LYMPH NODE (1/1).  Marland Kitchen  Excision / biopsy breast / nipple / duct Bilateral 01/26/2015    left breast negative. right breast positive    Current Outpatient Rx  Name  Route  Sig  Dispense  Refill  . acetaminophen (TYLENOL) 500 MG tablet   Oral   Take 500 mg by mouth as needed. Reported on 07/27/2015         . aspirin-acetaminophen-caffeine (EXCEDRIN MIGRAINE) 250-250-65 MG per tablet   Oral   Take 1 tablet by mouth every 6 (six) hours as needed for headache. Reported on 07/27/2015         . cetirizine (ZYRTEC) 10 MG tablet   Oral   Take 10 mg by mouth daily as needed for allergies.         . tamoxifen (NOLVADEX) 20 MG tablet   Oral   Take 1 tablet (20 mg total) by mouth daily.   30 tablet   11     Allergies Cephalexin; Chocolate; Corn oil; Peanut oil; Phenobarbital; Sumatriptan; Vilazodone hcl; Duloxetine hcl; Latex; and Tape  Family History  Problem Relation Age of Onset  . Breast cancer Sister 83  . Testicular cancer Father     Social History Social History  Substance Use Topics  . Smoking status: Never Smoker   . Smokeless tobacco: Never Used  . Alcohol Use: No    Review of Systems  Constitutional: Negative for fever. Eyes: Negative  for redness ENT: Negative for sore throat Cardiovascular: Negative for chest pain Respiratory: Negative for cough Gastrointestinal: Positive for abdominal pain as above Genitourinary: Negative for dysuria. Musculoskeletal: Negative for back pain. Skin: Negative for rash. Neurological: Negative for focal weakness Psychiatric: no anxiety    ____________________________________________   PHYSICAL EXAM:  VITAL SIGNS: ED Triage Vitals  Enc Vitals Group     BP 09/08/15 1715 140/92 mmHg     Pulse Rate 09/08/15 1715 98     Resp 09/08/15 1715 20     Temp 09/08/15 1715 98.2 F (36.8 C)     Temp Source 09/08/15 1715 Oral     SpO2 09/08/15 1715 98 %     Weight 09/08/15 1715 159 lb (72.122 kg)     Height 09/08/15 1715 5\' 2"  (1.575 m)     Head Cir  --      Peak Flow --      Pain Score 09/08/15 1724 10     Pain Loc --      Pain Edu? --      Excl. in [place]? --      Constitutional: Alert and oriented. Well appearing and in no distress.  Eyes: Conjunctivae are normal. No erythema or injection ENT   Head: Normocephalic and atraumatic.   Mouth/Throat: Mucous membranes are moist. Cardiovascular: Normal rate, regular rhythm. Respiratory: Normal respiratory effort without tachypnea nor retractions. Breath sounds are clear and equal bilaterally.  Gastrointestinal: Right lower quadrant tenderness to palpation.. No distention. There is no CVA tenderness. Genitourinary: deferred Musculoskeletal: Nontender with normal range of motion in all extremities. No lower extremity tenderness nor edema. Neurologic:  Normal speech and language. No gross focal neurologic deficits are appreciated. Skin:  Skin is warm, dry and intact. No rash noted. Psychiatric: Mood and affect are normal. Patient exhibits appropriate insight and judgment.  ____________________________________________    LABS (pertinent positives/negatives)  Labs Reviewed  CBC - Abnormal; Notable for the following:    MCV 79.8 (*)    All other components within normal limits  COMPREHENSIVE METABOLIC PANEL - Abnormal; Notable for the following:    Calcium 8.5 (*)    ALT 10 (*)    All other components within normal limits  URINALYSIS COMPLETEWITH MICROSCOPIC ([facility] ONLY) - Abnormal; Notable for the following:    Color, Urine YELLOW (*)    APPearance CLEAR (*)    Bacteria, UA RARE (*)    Squamous Epithelial / LPF 0-5 (*)    All other components within normal limits  LIPASE, BLOOD  POCT PREGNANCY, URINE    ____________________________________________   EKG  None  ____________________________________________    RADIOLOGY  CT abdomen pelvis shows normal appendix, constipation and right adnexal cyst Ultrasound pelvis  pending ____________________________________________   PROCEDURES  Procedure(s) performed: none  Critical Care performed: none  ____________________________________________   INITIAL IMPRESSION / ASSESSMENT AND PLAN / ED COURSE  Pertinent labs & imaging results that were available during my care of the patient were reviewed by me and considered in my medical decision making (see chart for details).  Patient with right lower quadrant tenderness to palpation. We will obtain labs, urine, CT abdomen and pelvis to evaluate for possible appendicitis. Morphine and Zofran IV given  Given almost 7 cm cyst on CT scan we will obtain ultrasound to rule out torsion which I think is unlikely but certainly possible.  I have asked my colleague to follow up on the [place] results. Suspect likely discharge  ____________________________________________   FINAL CLINICAL  IMPRESSION(S) / ED DIAGNOSES  Final diagnoses:  Ovarian cyst  Ovarian cyst   Abdominal pain       Lavonia Drafts, MD 09/08/15 2106

## 2015-09-08 NOTE — ED Notes (Signed)
Report given to [place], Therapist, sports. Patient currently in ultrasound and will go to room 7 when complete.

## 2015-09-08 NOTE — ED Provider Notes (Signed)
Ultrasound report returns there is flow in both ovaries I discussed this in detail with the radiologist to read it there is nothing that requires follow-up on the ultrasound. I will discharge the patient as planned by Dr. Luana Shu, MD 09/08/15 2218

## 2015-09-08 NOTE — ED Notes (Signed)
Patient with abdominal cramping and pain since last Friday.

## 2015-09-08 NOTE — Discharge Instructions (Signed)
Constipation, Adult Constipation is when a person:  Poops (has a bowel movement) less than 3 times a week.  Has a hard time pooping.  Has poop that is dry, hard, or bigger than normal. HOME CARE   Eat foods with a lot of fiber in them. This includes fruits, vegetables, beans, and whole grains such as brown rice.  Avoid fatty foods and foods with a lot of sugar. This includes french fries, hamburgers, cookies, candy, and soda.  If you are not getting enough fiber from food, take products with added fiber in them (supplements).  Drink enough fluid to keep your pee (urine) clear or pale yellow.  Exercise on a regular basis, or as told by your doctor.  Go to the restroom when you feel like you need to poop. Do not hold it.  Only take medicine as told by your doctor. Do not take medicines that help you poop (laxatives) without talking to your doctor first. GET HELP RIGHT AWAY IF:   You have bright red blood in your poop (stool).  Your constipation lasts more than 4 days or gets worse.  You have belly (abdominal) or butt (rectal) pain.  You have thin poop (as thin as a pencil).  You lose weight, and it cannot be explained. MAKE SURE YOU:   Understand these instructions.  Will watch your condition.  Will get help right away if you are not doing well or get worse.   This information is not intended to replace advice given to you by your health care provider. Make sure you discuss any questions you have with your health care provider.   Document Released: 11/20/2007 Document Revised: 06/24/2014 Document Reviewed: 03/15/2013 Elsevier Interactive Patient Education 2016 Elsevier Inc.  Abdominal Pain, Adult Many things can cause belly (abdominal) pain. Most times, the belly pain is not dangerous. Many cases of belly pain can be watched and treated at home. HOME CARE   Do not take medicines that help you go poop (laxatives) unless told to by your doctor.  Only take medicine as  told by your doctor.  Eat or drink as told by your doctor. Your doctor will tell you if you should be on a special diet. GET HELP IF:  You do not know what is causing your belly pain.  You have belly pain while you are sick to your stomach (nauseous) or have runny poop (diarrhea).  You have pain while you pee or poop.  Your belly pain wakes you up at night.  You have belly pain that gets worse or better when you eat.  You have belly pain that gets worse when you eat fatty foods.  You have a fever. GET HELP RIGHT AWAY IF:   The pain does not go away within 2 hours.  You keep throwing up (vomiting).  The pain changes and is only in the right or left part of the belly.  You have bloody or tarry looking poop. MAKE SURE YOU:   Understand these instructions.  Will watch your condition.  Will get help right away if you are not doing well or get worse.   This information is not intended to replace advice given to you by your health care provider. Make sure you discuss any questions you have with your health care provider.   Document Released: 11/20/2007 Document Revised: 06/24/2014 Document Reviewed: 02/10/2013 Elsevier Interactive Patient Education [employer].

## 2015-10-11 ENCOUNTER — Other Ambulatory Visit: Payer: Self-pay

## 2015-10-11 DIAGNOSIS — C50911 Malignant neoplasm of unspecified site of right female breast: Secondary | ICD-10-CM

## 2015-10-16 ENCOUNTER — Telehealth: Payer: Self-pay | Admitting: *Deleted

## 2015-10-16 NOTE — Telephone Encounter (Signed)
Patients mother called and stated that they got a bill for DOS 01/26/15 and medicaid was not filled. She just wants to get that refilled for that date of service.

## 2015-11-28 ENCOUNTER — Ambulatory Visit: Payer: Medicare Other | Admitting: Oncology

## 2015-11-28 ENCOUNTER — Other Ambulatory Visit: Payer: Medicare Other

## 2015-11-28 ENCOUNTER — Ambulatory Visit: Payer: Medicare Other

## 2015-11-30 ENCOUNTER — Inpatient Hospital Stay: Payer: Medicare Other

## 2015-11-30 ENCOUNTER — Encounter: Payer: Self-pay | Admitting: Radiation Oncology

## 2015-11-30 ENCOUNTER — Inpatient Hospital Stay (HOSPITAL_BASED_OUTPATIENT_CLINIC_OR_DEPARTMENT_OTHER): Payer: Medicare Other | Admitting: Oncology

## 2015-11-30 ENCOUNTER — Inpatient Hospital Stay: Payer: Medicare Other | Attending: Oncology

## 2015-11-30 ENCOUNTER — Ambulatory Visit
Admission: RE | Admit: 2015-11-30 | Discharge: 2015-11-30 | Disposition: A | Discharge status: Home / Self Care | Payer: Medicare Other | Source: Ambulatory Visit | Attending: Radiation Oncology | Admitting: Radiation Oncology

## 2015-11-30 VITALS — BP 124/81 | HR 80 | Temp 98.5°F | Resp 20 | Wt 153.1 lb

## 2015-11-30 DIAGNOSIS — R601 Generalized edema: Secondary | ICD-10-CM | POA: Diagnosis not present

## 2015-11-30 DIAGNOSIS — Z170 Estrogen receptor positive status [ER+]: Secondary | ICD-10-CM | POA: Insufficient documentation

## 2015-11-30 DIAGNOSIS — C50911 Malignant neoplasm of unspecified site of right female breast: Secondary | ICD-10-CM | POA: Diagnosis present

## 2015-11-30 DIAGNOSIS — Z79810 Long term (current) use of selective estrogen receptor modulators (SERMs): Secondary | ICD-10-CM | POA: Insufficient documentation

## 2015-11-30 DIAGNOSIS — D509 Iron deficiency anemia, unspecified: Secondary | ICD-10-CM | POA: Insufficient documentation

## 2015-11-30 DIAGNOSIS — Z923 Personal history of irradiation: Secondary | ICD-10-CM | POA: Insufficient documentation

## 2015-11-30 DIAGNOSIS — F419 Anxiety disorder, unspecified: Secondary | ICD-10-CM | POA: Insufficient documentation

## 2015-11-30 DIAGNOSIS — Z803 Family history of malignant neoplasm of breast: Secondary | ICD-10-CM | POA: Diagnosis not present

## 2015-11-30 DIAGNOSIS — Z79899 Other long term (current) drug therapy: Secondary | ICD-10-CM | POA: Insufficient documentation

## 2015-11-30 DIAGNOSIS — Z7982 Long term (current) use of aspirin: Secondary | ICD-10-CM | POA: Diagnosis not present

## 2015-11-30 DIAGNOSIS — K219 Gastro-esophageal reflux disease without esophagitis: Secondary | ICD-10-CM

## 2015-11-30 DIAGNOSIS — I10 Essential (primary) hypertension: Secondary | ICD-10-CM | POA: Insufficient documentation

## 2015-11-30 DIAGNOSIS — Z8043 Family history of malignant neoplasm of testis: Secondary | ICD-10-CM | POA: Insufficient documentation

## 2015-11-30 LAB — CBC WITH DIFFERENTIAL/PLATELET
BASOS ABS: 0.1 10*3/uL (ref 0–0.1)
BASOS PCT: 1 %
EOS ABS: 0.3 10*3/uL (ref 0–0.7)
Eosinophils Relative: 6 %
HEMATOCRIT: 37.7 % (ref 35.0–47.0)
Hemoglobin: 13.1 g/dL (ref 12.0–16.0)
Lymphocytes Relative: 21 %
Lymphs Abs: 1 10*3/uL (ref 1.0–3.6)
MCH: 28 pg (ref 26.0–34.0)
MCHC: 34.7 g/dL (ref 32.0–36.0)
MCV: 80.5 fL (ref 80.0–100.0)
MONO ABS: 0.3 10*3/uL (ref 0.2–0.9)
MONOS PCT: 7 %
NEUTROS ABS: 3 10*3/uL (ref 1.4–6.5)
Neutrophils Relative %: 65 %
PLATELETS: 257 10*3/uL (ref 150–440)
RBC: 4.68 MIL/uL (ref 3.80–5.20)
RDW: 14.4 % (ref 11.5–14.5)
WBC: 4.7 10*3/uL (ref 3.6–11.0)

## 2015-11-30 LAB — IRON AND TIBC
Iron: 51 ug/dL (ref 28–170)
SATURATION RATIOS: 17 % (ref 10.4–31.8)
TIBC: 297 ug/dL (ref 250–450)
UIBC: 246 ug/dL

## 2015-11-30 LAB — FERRITIN: FERRITIN: 24 ng/mL (ref 11–307)

## 2015-11-30 NOTE — Progress Notes (Signed)
States has been getting b12 injections through PCP, feeling well otherwise.

## 2015-11-30 NOTE — Progress Notes (Signed)
Radiation Oncology Follow up Note  Name: Karen Beard   Date:   11/30/2015 MRN:  [id number] DOB: [birth year]    This 45 y.o. female presents to the clinic today for six-month follow-up for stage I breast cancer status post wide local excision and sentinel node biopsy to her right breast.  REFERRING PROVIDER: Maryland Pink, MD  HPI: Patient is a 45 year old female now out 6 months having completed radiation therapy to her right breast for stage I invasive mammary carcinoma status post wide local excision and sentinel node biopsy. She is currently on tamoxifen tolerating that well without side effect. She still has some tenderness in her right axilla consistent with her prior surgery. She otherwise specifically denies breast tenderness cough or bone pain. She is scheduled for mammogram in the next couple of months..  COMPLICATIONS OF TREATMENT: none  FOLLOW UP COMPLIANCE: keeps appointments   PHYSICAL EXAM:  BP 124/81 mmHg  Pulse 80  Temp(Src) 98.5 F (36.9 C)  Resp 20  Wt 153 lb 1.8 oz (69.45 kg) Patient is status post sacrifice of the right nipple areolar complex. No dominant mass or nodularity is noted in either breast in 2 positions examined no axillary or supraclavicular adenopathy is identified there is some tenderness on exam of the right axilla. Well-developed well-nourished patient in NAD. HEENT reveals PERLA, EOMI, discs not visualized.  Oral cavity is clear. No oral mucosal lesions are identified. Neck is clear without evidence of cervical or supraclavicular adenopathy. Lungs are clear to A&P. Cardiac examination is essentially unremarkable with regular rate and rhythm without murmur rub or thrill. Abdomen is benign with no organomegaly or masses noted. Motor sensory and DTR levels are equal and symmetric in the upper and lower extremities. Cranial nerves II through XII are grossly intact. Proprioception is intact. No peripheral adenopathy or edema is identified. No motor or  sensory levels are noted. Crude visual fields are within normal range.  RADIOLOGY RESULTS: Mammograms will be reviewed when available  PLAN: At the present time she continues to do well with no evidence of disease. I'm please were overall progress. She is scheduled for follow-up mammograms. She continues on tamoxifen without side effect. I've asked to see her back in 6 months and then will start once your follow-up visits.  I would like to take this opportunity to thank you for allowing me to participate in the care of your patient.Armstead Peaks., MD

## 2015-12-10 NOTE — Progress Notes (Signed)
Karen Beard  [phone] [phone]  ID: CAMRI MOLLOY OB: 1970/08/20  MR#: [id number]  UMP#:[id number]  Patient Care Team: Maryland Pink, MD as PCP - General (Family Medicine) Maryland Pink, MD as Referring Physician (Family Medicine) Seeplaputhur Robinette Haines, MD (General Surgery)  CHIEF COMPLAINT:  No chief complaint on file.   INTERVAL HISTORY: Patient returns to clinic today for repeat laboratory work and further evaluation. She is tolerating tamoxifen well without significant side effects. She continues to be anxious. She has no neurologic complaints. She denies any recent fevers. She has a good appetite and denies weight loss. She denies any chest pain or shortness of breath. She denies any nausea, vomiting, constipation, or diarrhea. Patient offers no further specific complaints.  REVIEW OF SYSTEMS:   Review of Systems  Constitutional: Negative for fever and malaise/fatigue.  Respiratory: Negative.   Cardiovascular: Negative.  Negative for chest pain and leg swelling.  Gastrointestinal: Negative.  Negative for blood in stool and melena.  Musculoskeletal: Negative.   Neurological: Negative for sensory change and weakness.  Psychiatric/Behavioral: The patient is nervous/anxious.     As per HPI. Otherwise, a complete review of systems is negatve.  PAST MEDICAL HISTORY: Past Medical History  Diagnosis Date  . Depression   . Migraine   . GERD (gastroesophageal reflux disease)   . Seizures (HCC)     AS A CHILD-NONE SINCE  . Edema of both legs   . Learning disability   . Hypertension     RECENTLY TAKEN OFF OF MEDS PER MOM   . 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 Breast cancer ([place]) 01/2015    right breast lumpectomy with rad tx    PAST SURGICAL HISTORY: Past Surgical History  Procedure Laterality Date  . Axillary hidradenitis excision      unc  . Inguinal hidradenitis excision    .  Carpal tunnel release    . Breast ductal system excision Bilateral 01/26/2015    Procedure: BILATERAL EXCISION DUCTAL SYSTEM BREAST;  Surgeon: Christene Lye, MD;  Location: [facility] ORS;  Service: General;  Laterality: Bilateral;  . Breast lumpectomy Right 02/06/2015    Procedure: BREAST LUMPECTOMY;  Surgeon: Christene Lye, MD;  Location: [facility] ORS;  Service: General;  Laterality: Right;  . Axillary sentinel node biopsy Right 02/23/2015    Procedure: AXILLARY SN BIOPSY;  Dr Jamal Collin, MD;  [facility] ORS;  ISOLATED TUMOR CELLS IN ONE LYMPH NODE (1/1).  Marland Kitchen Excision / biopsy breast / nipple / duct Bilateral 01/26/2015    left breast negative. right breast positive    FAMILY HISTORY Family History  Problem Relation Age of Onset  . Breast cancer Sister 69  . Testicular cancer Father        ADVANCED DIRECTIVES:    HEALTH MAINTENANCE: Social History  Substance Use Topics  . Smoking status: Never Smoker   . Smokeless tobacco: Never Used  . Alcohol Use: No     Colonoscopy:  PAP:  Bone density:  Lipid panel:  Allergies  Allergen Reactions  . Cephalexin Nausea And Vomiting  . Chocolate Swelling  . Corn Oil Other (See Comments)  . Peanut Oil Other (See Comments)  . Phenobarbital Other (See Comments)  . Sumatriptan Other (See Comments)  . Vilazodone Hcl Nausea Only  . Duloxetine Hcl Anxiety    agitation  . Latex Rash  . Tape Rash    Current Outpatient Prescriptions  Medication Sig Dispense  Refill  . acetaminophen (TYLENOL) 500 MG tablet Take 500 mg by mouth as needed. Reported on 07/27/2015    . aspirin-acetaminophen-caffeine (EXCEDRIN MIGRAINE) 250-250-65 MG per tablet Take 1 tablet by mouth every 6 (six) hours as needed for headache. Reported on 07/27/2015    . cetirizine (ZYRTEC) 10 MG tablet Take 10 mg by mouth daily as needed for allergies.    . cyanocobalamin (,VITAMIN B-12,) 1000 MCG/ML injection Inject into the muscle.    . losartan-hydrochlorothiazide (HYZAAR) 50-12.5 MG  tablet Take by mouth.    . polyethylene glycol (MIRALAX) packet Take 17 g by mouth daily. 14 each 0  . sertraline (ZOLOFT) 50 MG tablet     . tamoxifen (NOLVADEX) 20 MG tablet Take 1 tablet (20 mg total) by mouth daily. 30 tablet 11   No current facility-administered medications for this visit.    OBJECTIVE: There were no vitals filed for this visit.   There is no weight on file to calculate BMI.    ECOG FS:0 - Asymptomatic  General: Well-developed, well-nourished, no acute distress. Eyes: Pink conjunctiva, anicteric sclera. Breasts: Patient request exam be deferred today. Lungs: Clear to auscultation bilaterally. Heart: Regular rate and rhythm. No rubs, murmurs, or gallops. Abdomen: Soft, nontender, nondistended. No organomegaly noted, normoactive bowel sounds. Musculoskeletal: No edema, cyanosis, or clubbing. Neuro: Alert, answering all questions appropriately. Cranial nerves grossly intact. Skin: No rashes or petechiae noted. Psych: Normal affect.   LAB RESULTS:  Lab Results  Component Value Date   NA 135 09/08/2015   K 4.0 09/08/2015   CL 105 09/08/2015   CO2 25 09/08/2015   GLUCOSE 86 09/08/2015   BUN 20 09/08/2015   CREATININE 0.68 09/08/2015   CALCIUM 8.5* 09/08/2015   PROT 6.8 09/08/2015   ALBUMIN 3.5 09/08/2015   AST 19 09/08/2015   ALT 10* 09/08/2015   ALKPHOS 42 09/08/2015   BILITOT 0.4 09/08/2015   GFRNONAA >60 09/08/2015   GFRAA >60 09/08/2015    Lab Results  Component Value Date   WBC 4.7 11/30/2015   NEUTROABS 3.0 11/30/2015   HGB 13.1 11/30/2015   HCT 37.7 11/30/2015   MCV 80.5 11/30/2015   PLT 257 11/30/2015   Lab Results  Component Value Date   IRON 51 11/30/2015   TIBC 297 11/30/2015   IRONPCTSAT 17 11/30/2015    Lab Results  Component Value Date   FERRITIN 24 11/30/2015     STUDIES: No results found.  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  PLAN:    1. Breast cancer: No evidence of disease. Patient's most recent mammogram on July 19, 2015 was reported as BI-RADS 2. Repeat in February 2018. Continue tamoxifen daily for 5 years completing in December 2021. Return to clinic in 6 months for routine evaluation. Will coordinate this visit with Radiation Oncology. 2. Edema/anasarca: Improving. Cardiac echo revealed EF of 65-70%. Patient's kidney and liver function are also within normal limits. 3. Heavy menses: Improved. Patient was previously given a referral to gynecology. 4. Iron deficiency anemia: Patient's hemoglobin and iron stores are within normal limits. She does not require additional IV Feraheme today. She last received iron in October 2016.    Patient expressed understanding and was in agreement with this plan. She also understands that She can call clinic at any time with any questions, concerns, or complaints.    Lloyd Huger, MD   12/10/2015 9:14 AM

## 2015-12-20 ENCOUNTER — Ambulatory Visit
Admission: RE | Admit: 2015-12-20 | Discharge: 2015-12-20 | Disposition: A | Discharge status: Home / Self Care | Payer: Medicare Other | Source: Ambulatory Visit | Attending: General Surgery | Admitting: General Surgery

## 2015-12-20 ENCOUNTER — Other Ambulatory Visit: Payer: Self-pay | Admitting: General Surgery

## 2015-12-20 DIAGNOSIS — C50911 Malignant neoplasm of unspecified site of right female breast: Secondary | ICD-10-CM

## 2015-12-20 DIAGNOSIS — Z9889 Other specified postprocedural states: Secondary | ICD-10-CM | POA: Insufficient documentation

## 2015-12-20 DIAGNOSIS — R928 Other abnormal and inconclusive findings on diagnostic imaging of breast: Secondary | ICD-10-CM | POA: Diagnosis not present

## 2015-12-28 ENCOUNTER — Encounter: Payer: Medicare Other | Admitting: General Surgery

## 2015-12-28 ENCOUNTER — Encounter: Payer: Self-pay | Admitting: *Deleted

## 2015-12-28 NOTE — Progress Notes (Signed)
Patient ID: Karen Beard, female   DOB: July 22, 1970, 45 y.o.   MRN: [id number]  Chief Complaint  Patient presents with  . Follow-up    mammogram    HPI Karen Beard is a 45 y.o. female.  Marland Kitchen     HPI  Past Medical History  Diagnosis Date  . Depression   . Migraine   . GERD (gastroesophageal reflux disease)   . Seizures (HCC)     AS A CHILD-NONE SINCE  . Edema of both legs   . Learning disability   . Hypertension     RECENTLY TAKEN OFF OF MEDS PER MOM   . 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 Breast cancer ([place]) 01/2015    right breast lumpectomy with rad tx    Past Surgical History  Procedure Laterality Date  . Axillary hidradenitis excision      unc  . Inguinal hidradenitis excision    . Carpal tunnel release    . Breast ductal system excision Bilateral 01/26/2015    Procedure: BILATERAL EXCISION DUCTAL SYSTEM BREAST;  Surgeon: Christene Lye, MD;  Location: [facility] ORS;  Service: General;  Laterality: Bilateral;  . Breast lumpectomy Right 02/06/2015    Procedure: BREAST LUMPECTOMY;  Surgeon: Christene Lye, MD;  Location: [facility] ORS;  Service: General;  Laterality: Right;  . Axillary sentinel node biopsy Right 02/23/2015    Procedure: AXILLARY SN BIOPSY;  Dr Jamal Collin, MD;  [facility] ORS;  ISOLATED TUMOR CELLS IN ONE LYMPH NODE (1/1).  Marland Kitchen Excision / biopsy breast / nipple / duct Bilateral 01/26/2015    left breast negative. right breast positive    Family History  Problem Relation Age of Onset  . Breast cancer Sister 60  . Testicular cancer Father     Social History Social History  Substance Use Topics  . Smoking status: Never Smoker   . Smokeless tobacco: Never Used  . Alcohol Use: No    Allergies  Allergen Reactions  . Cephalexin Nausea And Vomiting  . Chocolate Swelling  . Corn Oil Other (See Comments)  . Peanut Oil Other (See Comments)  . Phenobarbital Other (See Comments)  . Sumatriptan Other (See  Comments)  . Vilazodone Hcl Nausea Only  . Duloxetine Hcl Anxiety    agitation  . Latex Rash  . Tape Rash    Current Outpatient Prescriptions  Medication Sig Dispense Refill  . acetaminophen (TYLENOL) 500 MG tablet Take 500 mg by mouth as needed. Reported on 07/27/2015    . aspirin-acetaminophen-caffeine (EXCEDRIN MIGRAINE) 250-250-65 MG per tablet Take 1 tablet by mouth every 6 (six) hours as needed for headache. Reported on 07/27/2015    . cetirizine (ZYRTEC) 10 MG tablet Take 10 mg by mouth daily as needed for allergies.    . cyanocobalamin (,VITAMIN B-12,) 1000 MCG/ML injection Inject into the muscle.    . losartan-hydrochlorothiazide (HYZAAR) 50-12.5 MG tablet Take by mouth.    . polyethylene glycol (MIRALAX) packet Take 17 g by mouth daily. 14 each 0  . sertraline (ZOLOFT) 50 MG tablet     . tamoxifen (NOLVADEX) 20 MG tablet Take 1 tablet (20 mg total) by mouth daily. 30 tablet 11   No current facility-administered medications for this visit.    Review of Systems Review of Systems  There were no vitals taken for this visit.  Physical Exam Physical Exam  Data Reviewed   Assessment      Plan         .  Karen Beard 12/28/2015, 10:21 AM

## 2015-12-28 NOTE — Progress Notes (Signed)
This encounter was created in error - please disregard.

## 2015-12-28 NOTE — Progress Notes (Signed)
  Oncology Nurse Navigator Documentation  Navigator Location: [facility] (12/28/15 1400) Navigator Encounter Type: Telephone (12/28/15 1400) Telephone: Incoming Call (12/28/15 1400)                 Interventions: Coordination of Care (12/28/15 1400)            Acuity: Level 2 (12/28/15 1400)         Time Spent with Patient: 15 (12/28/15 1400)   Patient's mom called in regards to the results of her daughters mammogram and need for biopsy.  She wants to know if Dr. Grayland Ormond agrees that the patient should have a biopsy if recommended by Dr. Jamal Collin.  Discussed with Dr. Grayland Ormond and advised that he agrees her daughter should have a biopsy as recommended by the radiologist.

## 2016-01-02 ENCOUNTER — Ambulatory Visit (INDEPENDENT_AMBULATORY_CARE_PROVIDER_SITE_OTHER): Payer: Medicare Other | Admitting: General Surgery

## 2016-01-02 ENCOUNTER — Telehealth: Payer: Self-pay | Admitting: *Deleted

## 2016-01-02 ENCOUNTER — Encounter: Payer: Self-pay | Admitting: General Surgery

## 2016-01-02 VITALS — BP 124/76 | HR 84 | Resp 12 | Ht 62.0 in | Wt 160.0 lb

## 2016-01-02 DIAGNOSIS — C50911 Malignant neoplasm of unspecified site of right female breast: Secondary | ICD-10-CM

## 2016-01-02 NOTE — Progress Notes (Signed)
  Oncology Nurse Navigator Documentation  Navigator Location: [facility] (01/02/16 1200) Navigator Encounter Type: Telephone;Diagnostic Results (01/02/16 1200) Telephone: Incoming Call;Outgoing Call;Appt Confirmation/Clarification;Diagnostic Results;Symptom Mgt;Clinic/MDC Follow-up (01/02/16 1200) Abnormal Finding Date: 12/20/15 (01/02/16 1200)           Barriers/Navigation Needs: Family concerns (01/02/16 1200)   Interventions: Coordination of Care (01/02/16 1200)            Acuity: Level 2 (01/02/16 1200)   Acuity Level 2: Educational needs;Ongoing guidance and education throughout treatment as needed (01/02/16 1200)     Time Spent with Patient: 30 (01/02/16 1200)  Received message from patient's mother regarding Birads 4 mammogram, and ultrasound results, and recommendation for right breast biopsy, and bilateral breast MRI.  Patient was diagnosed with breast cancer in 2016.  She also has a history of learning disability.  Mother is primary caregiver.  Sister just completed treatment for breast cancer.  Mother wants reassurance that all physicians are aware of recommendations.  Patient has appointment with Dr. Jamal Collin this afternoon at 2:45.   Dr. Grayland Ormond aware of diagnostic recommendations, and in agreement.  Offered Healing Touch and/or massage therapy to patient, and her mother to help with anxiety.

## 2016-01-02 NOTE — Telephone Encounter (Signed)
Patients mother called [facility]vers[facility] this morning to report patients recent mammogram. Patients most recent mammogram reported as BIRADS 4 with recommendation of biopsy of right breast and bilateral breast MRI. Patient has follow up with Dr. Bary Castilla scheduled for today. Patient and mother wanted to see if you agree with current plan.

## 2016-01-02 NOTE — Patient Instructions (Signed)
Will follow up with patient after Dr. Bary Castilla reviews mammogams and ultrasound.

## 2016-01-02 NOTE — Progress Notes (Signed)
Patient ID: Karen Beard, female   DOB: 1970-08-22, 45 y.o.   MRN: [id number]  Chief Complaint  Patient presents with  . Follow-up    mammogram    HPI Karen Beard is a 45 y.o. female who presents for a breast evaluation s/p right breast lumpectomy due to invasive mammary carcinoma and DCIS one year ago. The patient has no new breast complaints. The most recent mammogram was done on 12/20/15. Patient does perform regular self breast checks and gets regular mammograms done.  I have reviewed the history of present illness with the patient.   HPI  Past Medical History  Diagnosis Date  . Depression   . Migraine   . GERD (gastroesophageal reflux disease)   . Seizures (HCC)     AS A CHILD-NONE SINCE  . Edema of both legs   . Learning disability   . Hypertension     RECENTLY TAKEN OFF OF MEDS PER MOM   . 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 Breast cancer (Karen Beard) 01/2015    right breast lumpectomy with rad tx    Past Surgical History  Procedure Laterality Date  . Axillary hidradenitis excision      unc  . Inguinal hidradenitis excision    . Carpal tunnel release    . Breast ductal system excision Bilateral 01/26/2015    Procedure: BILATERAL EXCISION DUCTAL SYSTEM BREAST;  Surgeon: Christene Lye, MD;  Location: [facility] ORS;  Service: General;  Laterality: Bilateral;  . Breast lumpectomy Right 02/06/2015    Procedure: BREAST LUMPECTOMY;  Surgeon: Christene Lye, MD;  Location: [facility] ORS;  Service: General;  Laterality: Right;  . Axillary sentinel node biopsy Right 02/23/2015    Procedure: AXILLARY SN BIOPSY;  Dr Jamal Collin, MD;  [facility] ORS;  ISOLATED TUMOR CELLS IN ONE LYMPH NODE (1/1).  Marland Kitchen Excision / biopsy breast / nipple / duct Bilateral 01/26/2015    left breast negative. right breast positive    Family History  Problem Relation Age of Onset  . Breast cancer Sister 38  . Testicular cancer Father     Social History Social  History  Substance Use Topics  . Smoking status: Never Smoker   . Smokeless tobacco: Never Used  . Alcohol Use: No    Allergies  Allergen Reactions  . Cephalexin Nausea And Vomiting  . Chocolate Swelling  . Corn Oil Other (See Comments)  . Peanut Oil Other (See Comments)  . Phenobarbital Other (See Comments)  . Sumatriptan Other (See Comments)  . Vilazodone Hcl Nausea Only  . Duloxetine Hcl Anxiety    agitation  . Latex Rash  . Tape Rash    Current Outpatient Prescriptions  Medication Sig Dispense Refill  . acetaminophen (TYLENOL) 500 MG tablet Take 500 mg by mouth as needed. Reported on 07/27/2015    . aspirin-acetaminophen-caffeine (EXCEDRIN MIGRAINE) 250-250-65 MG per tablet Take 1 tablet by mouth every 6 (six) hours as needed for headache. Reported on 07/27/2015    . cetirizine (ZYRTEC) 10 MG tablet Take 10 mg by mouth daily as needed for allergies.    . cyanocobalamin (,VITAMIN B-12,) 1000 MCG/ML injection Inject into the muscle.    . losartan-hydrochlorothiazide (HYZAAR) 50-12.5 MG tablet Take by mouth.    . polyethylene glycol (MIRALAX) packet Take 17 g by mouth daily. 14 each 0  . sertraline (ZOLOFT) 50 MG tablet     . tamoxifen (NOLVADEX) 20 MG tablet Take 1 tablet (  20 mg total) by mouth daily. 30 tablet 11   No current facility-administered medications for this visit.    Review of Systems Review of Systems  Constitutional: Negative.   Respiratory: Negative.   Cardiovascular: Negative.     Blood pressure 124/76, pulse 84, resp. rate 12, height 5\' 2"  (1.575 m), weight 160 lb (72.576 kg).  Physical Exam Physical Exam  Constitutional: She is oriented to person, place, and time. She appears well-developed and well-nourished.  Eyes: Conjunctivae are normal. No scleral icterus.  Neck: Neck supple. No thyromegaly present.  Cardiovascular: Normal rate, regular rhythm and normal heart sounds.   Pulmonary/Chest: Effort normal and breath sounds normal. Right breast  exhibits no mass, no skin change and no tenderness. Left breast exhibits no inverted nipple, no mass, no nipple discharge, no skin change and no tenderness.    Lymphadenopathy:    She has no cervical adenopathy.    She has no axillary adenopathy.  Neurological: She is alert and oriented to person, place, and time.  Skin: Skin is warm and dry.    Data Reviewed Mammogram and Ultrasound reviewed Radiologist questioned area of microcalcifications near the previous lumpectomy.  Assessment    Mammographic results showed microcalcifications, likely secondary to lumpectomy and subsequent radiation treatment. I will discuss these findings with my associate to determine if she needs a biopsy of the area. DCIS right breast with invasive component, micromets in sentinel lymph node. 11 months from lumpectomy, SN biopsy and radiation. Currently on Tamoxifen and doing well     Plan    Will follow up with patient after discussion with Dr. Bary Castilla regarding mammographic findings.      PCP: Dr. Dannial Monarch 01/02/2016, 4:11 PM

## 2016-01-04 NOTE — Progress Notes (Signed)
    Oncology Nurse Navigator Documentation  Navigator Location: [facility] (01/04/16 1400) Navigator Encounter Type: Telephone (01/04/16 1400) Telephone: Incoming Call;Outgoing Call;Diagnostic Results (01/04/16 1400)             Barriers/Navigation Needs: Family concerns (01/04/16 1400)   Interventions: Other (01/04/16 1400)            Acuity: Level 2 (01/04/16 1400)         Time Spent with Patient: 30 (01/04/16 1400)  Patient's mother phoned after appointment with Dr. Jamal Collin.  He is going to discuss plan with his partner, and contact patient, and mother.  Had not heard from Dr. Jamal Collin today.  Discussed with Dr. Jamal Collin post Case Conference today.  He will contact patient, mother after reviewing image results with Dr. Bary Castilla.  Notified patient, and her mother.

## 2016-01-09 ENCOUNTER — Telehealth: Payer: Self-pay | Admitting: General Surgery

## 2016-01-09 NOTE — Progress Notes (Signed)
  Oncology Nurse Navigator Documentation  Navigator Location: [facility] (01/09/16 0800) Navigator Encounter Type: Telephone (01/09/16 0800) Telephone: Incoming Call;Outgoing Call (01/09/16 0800)             Barriers/Navigation Needs: Family concerns;Coordination of Care (01/09/16 0800)   Interventions: Other (01/09/16 0800)                      Time Spent with Patient: 60 (01/09/16 0800)   Patient's mother phoned on 01/08/16,  reporting she has not heard back from Dr. Jamal Collin.   States she would like to proceed with biopsy rather than wait.   Relates that there is increased anxiety due to both daughters having breast cancer.  Contacted Dr. Angie Fava office.  Patient's mother left message this morning stating Dr. Jamal Collin phoned her last night, and plan is to proceed with biopsy.

## 2016-01-09 NOTE — Telephone Encounter (Signed)
After review of her recent mammogram it was felt the microcalcification in right breast is more likely beniogn and can be followed. Discussed by phone with pt and her mother. Pt is very uneasy about it and wishes to have right breast micro calcifications biopsied. Left sided findings are not of concern and she is ok with it. Will arrange for her to have stereo biopsy done of right breast-Dr. Bary Castilla is agreeable to doing this

## 2016-01-10 ENCOUNTER — Telehealth: Payer: Self-pay

## 2016-01-10 DIAGNOSIS — R920 Mammographic microcalcification found on diagnostic imaging of breast: Secondary | ICD-10-CM

## 2016-01-10 NOTE — Telephone Encounter (Signed)
Call to patient's mother to schedule stereo biopsy for the patient. Patient has been scheduled for a lright breast stereotactic biopsy at [facility] for 01/15/16 at 2:00 pm. She will check-in at the [facility] at 1:30 pm. This patient is aware of date, time, and instructions. Patient's mother verbalizes understanding.

## 2016-01-11 ENCOUNTER — Other Ambulatory Visit: Payer: Self-pay | Admitting: General Surgery

## 2016-01-11 DIAGNOSIS — R920 Mammographic microcalcification found on diagnostic imaging of breast: Secondary | ICD-10-CM

## 2016-01-15 ENCOUNTER — Ambulatory Visit
Admission: RE | Admit: 2016-01-15 | Discharge: 2016-01-15 | Disposition: A | Discharge status: Home / Self Care | Payer: Medicare Other | Source: Ambulatory Visit | Attending: General Surgery | Admitting: General Surgery

## 2016-01-15 DIAGNOSIS — R920 Mammographic microcalcification found on diagnostic imaging of breast: Secondary | ICD-10-CM | POA: Diagnosis not present

## 2016-01-15 DIAGNOSIS — N6031 Fibrosclerosis of right breast: Secondary | ICD-10-CM | POA: Diagnosis not present

## 2016-01-15 DIAGNOSIS — R921 Mammographic calcification found on diagnostic imaging of breast: Secondary | ICD-10-CM | POA: Insufficient documentation

## 2016-01-15 HISTORY — PX: BREAST BIOPSY: SHX20

## 2016-01-16 LAB — SURGICAL PATHOLOGY

## 2016-01-17 ENCOUNTER — Telehealth: Payer: Self-pay | Admitting: *Deleted

## 2016-01-17 NOTE — Telephone Encounter (Signed)
Notified patient as instructed, patient pleased. Discussed follow-up appointments, patient agrees. Placed in recalls.  

## 2016-01-17 NOTE — Telephone Encounter (Signed)
-----   Message from Christene Lye, MD sent at 01/17/2016  2:09 PM EDT ----- Rosann Auerbach, please let pt pt know the pathology was normal. Arrange for her to have a bilateral diagnostic mammogram 14mos from last one

## 2016-01-23 ENCOUNTER — Ambulatory Visit (INDEPENDENT_AMBULATORY_CARE_PROVIDER_SITE_OTHER): Payer: Medicare Other | Admitting: *Deleted

## 2016-01-23 DIAGNOSIS — R920 Mammographic microcalcification found on diagnostic imaging of breast: Secondary | ICD-10-CM

## 2016-01-23 NOTE — Progress Notes (Signed)
Patient came in today for a wound check.  The wound is clean, with no signs of infection noted. Follow up as scheduled. Aware of pathology.

## 2016-03-11 ENCOUNTER — Emergency Department: Payer: Medicare Other

## 2016-03-11 ENCOUNTER — Emergency Department
Admission: EM | Admit: 2016-03-11 | Discharge: 2016-03-12 | Disposition: A | Discharge status: Other Acute Inpatient Hospital | Payer: Medicare Other | Attending: Emergency Medicine | Admitting: Emergency Medicine

## 2016-03-11 ENCOUNTER — Encounter: Payer: Self-pay | Admitting: *Deleted

## 2016-03-11 DIAGNOSIS — F22 Delusional disorders: Secondary | ICD-10-CM | POA: Diagnosis not present

## 2016-03-11 DIAGNOSIS — Z853 Personal history of malignant neoplasm of breast: Secondary | ICD-10-CM | POA: Diagnosis not present

## 2016-03-11 DIAGNOSIS — Z791 Long term (current) use of non-steroidal anti-inflammatories (NSAID): Secondary | ICD-10-CM | POA: Diagnosis not present

## 2016-03-11 DIAGNOSIS — F200 Paranoid schizophrenia: Secondary | ICD-10-CM | POA: Diagnosis not present

## 2016-03-11 DIAGNOSIS — Z046 Encounter for general psychiatric examination, requested by authority: Secondary | ICD-10-CM | POA: Diagnosis present

## 2016-03-11 DIAGNOSIS — Z91010 Allergy to peanuts: Secondary | ICD-10-CM | POA: Diagnosis not present

## 2016-03-11 DIAGNOSIS — Z7982 Long term (current) use of aspirin: Secondary | ICD-10-CM | POA: Diagnosis not present

## 2016-03-11 DIAGNOSIS — C50011 Malignant neoplasm of nipple and areola, right female breast: Secondary | ICD-10-CM | POA: Diagnosis present

## 2016-03-11 DIAGNOSIS — C50111 Malignant neoplasm of central portion of right female breast: Secondary | ICD-10-CM | POA: Diagnosis present

## 2016-03-11 DIAGNOSIS — N9489 Other specified conditions associated with female genital organs and menstrual cycle: Secondary | ICD-10-CM | POA: Insufficient documentation

## 2016-03-11 DIAGNOSIS — I10 Essential (primary) hypertension: Secondary | ICD-10-CM | POA: Insufficient documentation

## 2016-03-11 DIAGNOSIS — R413 Other amnesia: Secondary | ICD-10-CM | POA: Insufficient documentation

## 2016-03-11 DIAGNOSIS — Z91040 Latex allergy status: Secondary | ICD-10-CM | POA: Insufficient documentation

## 2016-03-11 DIAGNOSIS — Z79899 Other long term (current) drug therapy: Secondary | ICD-10-CM | POA: Insufficient documentation

## 2016-03-11 DIAGNOSIS — Z170 Estrogen receptor positive status [ER+]: Secondary | ICD-10-CM | POA: Diagnosis present

## 2016-03-11 LAB — COMPREHENSIVE METABOLIC PANEL
ALBUMIN: 3.8 g/dL (ref 3.5–5.0)
ALT: 13 U/L — ABNORMAL LOW (ref 14–54)
ANION GAP: 8 (ref 5–15)
AST: 21 U/L (ref 15–41)
Alkaline Phosphatase: 41 U/L (ref 38–126)
BILIRUBIN TOTAL: 0.8 mg/dL (ref 0.3–1.2)
BUN: 12 mg/dL (ref 6–20)
CO2: 25 mmol/L (ref 22–32)
Calcium: 8.8 mg/dL — ABNORMAL LOW (ref 8.9–10.3)
Chloride: 104 mmol/L (ref 101–111)
Creatinine, Ser: 0.62 mg/dL (ref 0.44–1.00)
Glucose, Bld: 124 mg/dL — ABNORMAL HIGH (ref 65–99)
POTASSIUM: 3.4 mmol/L — AB (ref 3.5–5.1)
SODIUM: 137 mmol/L (ref 135–145)
TOTAL PROTEIN: 7 g/dL (ref 6.5–8.1)

## 2016-03-11 LAB — URINE DRUG SCREEN, QUALITATIVE (ARMC ONLY)
AMPHETAMINES, UR SCREEN: NOT DETECTED
BENZODIAZEPINE, UR SCRN: NOT DETECTED
Barbiturates, Ur Screen: NOT DETECTED
CANNABINOID 50 NG, UR ~~LOC~~: NOT DETECTED
Cocaine Metabolite,Ur ~~LOC~~: NOT DETECTED
MDMA (Ecstasy)Ur Screen: NOT DETECTED
Methadone Scn, Ur: NOT DETECTED
Opiate, Ur Screen: NOT DETECTED
PHENCYCLIDINE (PCP) UR S: NOT DETECTED
Tricyclic, Ur Screen: NOT DETECTED

## 2016-03-11 LAB — CBC
HCT: 37.8 % (ref 35.0–47.0)
Hemoglobin: 13.1 g/dL (ref 12.0–16.0)
MCH: 28 pg (ref 26.0–34.0)
MCHC: 34.7 g/dL (ref 32.0–36.0)
MCV: 80.7 fL (ref 80.0–100.0)
PLATELETS: 234 10*3/uL (ref 150–440)
RBC: 4.69 MIL/uL (ref 3.80–5.20)
RDW: 13 % (ref 11.5–14.5)
WBC: 5.2 10*3/uL (ref 3.6–11.0)

## 2016-03-11 LAB — ACETAMINOPHEN LEVEL: Acetaminophen (Tylenol), Serum: <10 ug/mL — ABNORMAL LOW (ref 10–30)

## 2016-03-11 LAB — ETHANOL

## 2016-03-11 LAB — HCG, QUANTITATIVE, PREGNANCY: HCG, BETA CHAIN, QUANT, S: 1 m[IU]/mL (ref <5)

## 2016-03-11 LAB — TSH: TSH: 1.465 u[IU]/mL (ref 0.350–4.500)

## 2016-03-11 LAB — SALICYLATE LEVEL

## 2016-03-11 MED ORDER — TAMOXIFEN CITRATE 10 MG PO TABS
20.0000 mg | ORAL_TABLET | Freq: Every day | ORAL | Status: DC
  Administered 2016-03-11 – 2016-03-12 (×2): 20 mg via ORAL
  Filled 2016-03-11 (×2): qty 2

## 2016-03-11 MED ORDER — RISPERIDONE 1 MG PO TABS
1.0000 mg | ORAL_TABLET | Freq: Every day | ORAL | Status: DC
  Administered 2016-03-11: 1 mg via ORAL
  Filled 2016-03-11: qty 1

## 2016-03-11 MED ORDER — VITAMIN B-12 1000 MCG PO TABS
1000.0000 ug | ORAL_TABLET | Freq: Every day | ORAL | Status: DC
  Filled 2016-03-11 (×3): qty 1

## 2016-03-11 MED ORDER — SERTRALINE HCL 50 MG PO TABS
25.0000 mg | ORAL_TABLET | Freq: Every day | ORAL | Status: DC
  Administered 2016-03-11 – 2016-03-12 (×2): 25 mg via ORAL
  Filled 2016-03-11 (×2): qty 1

## 2016-03-11 MED ORDER — LOSARTAN POTASSIUM 50 MG PO TABS
50.0000 mg | ORAL_TABLET | Freq: Every day | ORAL | Status: DC
  Administered 2016-03-11 – 2016-03-12 (×2): 50 mg via ORAL
  Filled 2016-03-11 (×3): qty 1

## 2016-03-11 NOTE — ED Notes (Signed)
Pt. Alert and oriented, warm and dry, in no distress. Pt. Denies SI, HI, and AVH. Pt. Encouraged to let nursing staff know of any concerns or needs. 

## 2016-03-11 NOTE — ED Notes (Signed)
PT  PUT  UNDER  IVC  PER  DR NORMAN  PENDING  CONSULT

## 2016-03-11 NOTE — ED Notes (Signed)

## 2016-03-11 NOTE — ED Notes (Signed)
TTS speaking with pt through "penny cart 2" now
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
## 2016-03-12 ENCOUNTER — Inpatient Hospital Stay
Admission: RE | Admit: 2016-03-12 | Discharge: 2016-03-14 | DRG: 885 | Disposition: A | Discharge status: Home / Self Care | Payer: Medicare Other | Source: Ambulatory Visit | Attending: Psychiatry | Admitting: Psychiatry

## 2016-03-12 DIAGNOSIS — C50919 Malignant neoplasm of unspecified site of unspecified female breast: Secondary | ICD-10-CM | POA: Diagnosis present

## 2016-03-12 DIAGNOSIS — I10 Essential (primary) hypertension: Secondary | ICD-10-CM | POA: Diagnosis present

## 2016-03-12 DIAGNOSIS — Z79899 Other long term (current) drug therapy: Secondary | ICD-10-CM | POA: Diagnosis not present

## 2016-03-12 DIAGNOSIS — Z9889 Other specified postprocedural states: Secondary | ICD-10-CM | POA: Diagnosis not present

## 2016-03-12 DIAGNOSIS — G43909 Migraine, unspecified, not intractable, without status migrainosus: Secondary | ICD-10-CM | POA: Diagnosis present

## 2016-03-12 DIAGNOSIS — Z91040 Latex allergy status: Secondary | ICD-10-CM

## 2016-03-12 DIAGNOSIS — Z79810 Long term (current) use of selective estrogen receptor modulators (SERMs): Secondary | ICD-10-CM | POA: Diagnosis not present

## 2016-03-12 DIAGNOSIS — E781 Pure hyperglyceridemia: Secondary | ICD-10-CM | POA: Diagnosis present

## 2016-03-12 DIAGNOSIS — F22 Delusional disorders: Secondary | ICD-10-CM | POA: Diagnosis not present

## 2016-03-12 DIAGNOSIS — Z803 Family history of malignant neoplasm of breast: Secondary | ICD-10-CM

## 2016-03-12 DIAGNOSIS — K219 Gastro-esophageal reflux disease without esophagitis: Secondary | ICD-10-CM | POA: Diagnosis present

## 2016-03-12 DIAGNOSIS — F203 Undifferentiated schizophrenia: Principal | ICD-10-CM | POA: Diagnosis present

## 2016-03-12 DIAGNOSIS — G4700 Insomnia, unspecified: Secondary | ICD-10-CM | POA: Diagnosis present

## 2016-03-12 DIAGNOSIS — Z888 Allergy status to other drugs, medicaments and biological substances status: Secondary | ICD-10-CM

## 2016-03-12 DIAGNOSIS — F819 Developmental disorder of scholastic skills, unspecified: Secondary | ICD-10-CM | POA: Diagnosis present

## 2016-03-12 DIAGNOSIS — C50011 Malignant neoplasm of nipple and areola, right female breast: Secondary | ICD-10-CM | POA: Diagnosis present

## 2016-03-12 DIAGNOSIS — C50111 Malignant neoplasm of central portion of right female breast: Secondary | ICD-10-CM | POA: Diagnosis present

## 2016-03-12 MED ORDER — MAGNESIUM HYDROXIDE 400 MG/5ML PO SUSP: 30.0000 mL | Freq: Every day | ORAL | Status: DC | PRN

## 2016-03-12 MED ORDER — RISPERIDONE 1 MG PO TABS: 1.0000 mg | ORAL_TABLET | Freq: Every day | ORAL | Status: DC

## 2016-03-12 MED ORDER — RISPERIDONE 1 MG PO TABS
3.0000 mg | ORAL_TABLET | Freq: Every day | ORAL | Status: DC
  Administered 2016-03-12 – 2016-03-13 (×2): 3 mg via ORAL
  Filled 2016-03-12 (×2): qty 3

## 2016-03-12 MED ORDER — HYDROXYZINE HCL 25 MG PO TABS: 25.0000 mg | ORAL_TABLET | Freq: Three times a day (TID) | ORAL | Status: DC | PRN

## 2016-03-12 MED ORDER — TAMOXIFEN CITRATE 10 MG PO TABS
20.0000 mg | ORAL_TABLET | Freq: Every day | ORAL | Status: DC
  Administered 2016-03-13 – 2016-03-14 (×2): 20 mg via ORAL
  Filled 2016-03-12 (×2): qty 2

## 2016-03-12 MED ORDER — ACETAMINOPHEN 325 MG PO TABS
650.0000 mg | ORAL_TABLET | Freq: Four times a day (QID) | ORAL | Status: DC | PRN
  Administered 2016-03-12 (×2): 650 mg via ORAL
  Filled 2016-03-12 (×2): qty 2

## 2016-03-12 MED ORDER — SERTRALINE HCL 100 MG PO TABS
100.0000 mg | ORAL_TABLET | Freq: Every day | ORAL | Status: DC
  Administered 2016-03-13 – 2016-03-14 (×2): 100 mg via ORAL
  Filled 2016-03-12 (×2): qty 1

## 2016-03-12 MED ORDER — ALUM & MAG HYDROXIDE-SIMETH 200-200-20 MG/5ML PO SUSP
30.0000 mL | ORAL | Status: DC | PRN
Start: 2016-03-12 — End: 2016-03-14

## 2016-03-12 MED ORDER — LOSARTAN POTASSIUM 25 MG PO TABS
25.0000 mg | ORAL_TABLET | Freq: Every day | ORAL | Status: DC
  Administered 2016-03-13 – 2016-03-14 (×2): 25 mg via ORAL
  Filled 2016-03-12 (×2): qty 1

## 2016-03-12 MED ORDER — TRAZODONE HCL 100 MG PO TABS: 100.0000 mg | ORAL_TABLET | Freq: Every evening | ORAL | Status: DC | PRN

## 2016-03-12 NOTE — ED Notes (Signed)
Patient IVC'd, admitted to Behavioral health unit

## 2016-03-12 NOTE — ED Provider Notes (Signed)
-----------------------------------------   7:31 AM on 03/12/2016 -----------------------------------------   Blood pressure 105/70, pulse 90, temperature 98.1 F (36.7 C), temperature source Oral, resp. rate 18, height 5\' 2"  (1.575 m), weight 160 lb (72.6 kg), SpO2 100 %.  The patient had no acute events since last update.  Calm and cooperative at this time.  Disposition is pending Psychiatry/Behavioral Medicine team recommendations.     Loney Hering, MD 03/12/16 [id number]

## 2016-03-12 NOTE — BHH Group Notes (Signed)
[facility] LCSW Group Therapy   03/12/2016 1pm  Type of Therapy: Group Therapy: Feeling about Diagnosis    Participation Level: Pt invited but did not attend.    Glorious Peach, MSW, LCSWA 03/12/2016, 2:35PM

## 2016-03-12 NOTE — BHH Suicide Risk Assessment (Signed)
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 Undifferentiated schizophrenia ([place]) [F20.3] 03/12/2016  . Breast cancer ([place]) [C50.919] 03/15/2015  . Iron deficiency anemia [D50.9] 03/07/2015  . Family history of breast cancer [Z80.3] 01/17/2015   Subjective Data: psychosis.  Continued Clinical Symptoms:  Alcohol Use Disorder Identification Test Final Score (AUDIT): 0 The "Alcohol Use Disorders Identification Test", Guidelines for Use in Primary Care, Second Edition.  World Pharmacologist [facility]). Score between 0-7:  no or low risk or alcohol related problems. Score between 8-15:  moderate risk of alcohol related problems. Score between 16-19:  high risk of alcohol related problems. Score 20 or above:  warrants further diagnostic evaluation for alcohol dependence and treatment.   CLINICAL FACTORS:   Schizophrenia:   Less than 75 years old Paranoid or undifferentiated type Currently Psychotic   Musculoskeletal: Strength & Muscle Tone: within normal limits Gait & Station: normal Patient leans: N/A  Psychiatric Specialty Exam: Physical Exam  Nursing note and vitals reviewed.   Review of Systems  Psychiatric/Behavioral: Positive for hallucinations. The patient is nervous/anxious.   All other systems reviewed and are negative.   Blood pressure 120/81, pulse 87, temperature 98 F (36.7 C), temperature source Oral, resp. rate 16, height 5\' 2"  (1.575 m), weight 71.2 kg (157 lb), SpO2 100 %.Body mass index is 28.72 kg/m.  General Appearance: Casual  Eye Contact:  Good  Speech:  Clear and Coherent  Volume:  Normal  Mood:  Anxious  Affect:  Appropriate   Thought Process:  Linear  Orientation:  Full (Time, Place, and Person)  Thought Content:  Delusions, Hallucinations: Auditory and Paranoid Ideation  Suicidal Thoughts:  No  Homicidal Thoughts:  No  Memory:  Immediate;   Fair Recent;   Fair Remote;   Fair  Judgement:  Impaired  Insight:  Lacking  Psychomotor Activity:  Normal  Concentration:  Concentration: Fair and Attention Span: Fair  Recall:  [employer] of Knowledge:  Fair  Language:  Fair  Akathisia:  No  Handed:  Right  AIMS (if indicated):     Assets:  Communication Skills Desire for Improvement Financial Resources/Insurance Housing Physical Health Resilience Social Support  ADL's:  Intact  Cognition:  WNL  Sleep:         COGNITIVE FEATURES THAT CONTRIBUTE TO RISK:  None    SUICIDE RISK:   Mild:  Suicidal ideation of limited frequency, intensity, duration, and specificity.  There are no identifiable plans, no associated intent, mild dysphoria and related symptoms, good self-control (both objective and subjective assessment), few other risk factors, and identifiable protective factors, including available and accessible social support.   PLAN OF CARE: Hospital admission, medication management, discharge planning.  Karen Beard is a 45 year old female with a history of schizophrenia admitted for psychotic break.  1. Psychosis. We'll start Risperdal tonight.  2. Mood. She's been maintained on Zoloft we will continue.    3. Breast cancer. She is on tamoxifen.  4. Hypertension. She is on losartan.  5. Migraine headaches. She takes Excedrin at home and is allergic to Imitrex.  6. Metabolic syndrome monitoring. Lipid profile, TSH, hemoglobin A1c are pending.  7. Disposition.  She will be discharged to home with her mother. She will follow up with a new psychiatrist.    I certify that inpatient services furnished can reasonably be expected to improve the patient's condition.  Orson Slick, MD 03/12/2016,  6:46 PM

## 2016-03-12 NOTE — ED Provider Notes (Signed)
-----------------------------------------   10:08 AM on 03/12/2016 -----------------------------------------  Patient has been seen by psychiatry. They will be admitting to their service for further treatment.   Harvest Dark, MD 03/12/16 [id number]

## 2016-03-12 NOTE — Progress Notes (Signed)
D:  Patient denies SI/AVH/HI.  Patient has been tearful for most of the afternoon because she misses her mother. A:  Patient offered support and encouragement.  Patient encouraged to attend groups. R:  Patient safety maintained with 15 minute checks.

## 2016-03-12 NOTE — Progress Notes (Signed)
Writer spoke to pharmacy regarding ordered dose of tamoxifen. Told that they are waiting to speak to physician regarding ordered dose before verifying medication.

## 2016-03-12 NOTE — Progress Notes (Signed)
Patient arrived to unit at approximately 1055 AM.  Patient dressed in scrubs and gait steady.  Patient's speech was soft and slow and patient was pleasant with staff upon arrival.  Patient was cooperative with assessment and provided answers in childlike manner.  Patient denies SI/AVH/HI at this time.  Patient skin checked with another staff member present and was found to be intact.  Patient missing nipple on R breast related to surgery and has one surgical scar located in right armpit related to previous surgery.Patient oriented to the unit and to her room.  Patient belongings searched and logged by staff.

## 2016-03-12 NOTE — H&P (Addendum)
Psychiatric Admission Assessment Adult  Patient Identification: Karen Beard MRN:  [id number] Date of Evaluation:  03/12/2016 Chief Complaint:  schizophrenia Principal Diagnosis: Undifferentiated schizophrenia ([place]) Diagnosis:   Patient Active Problem List   Diagnosis Date Noted  . Undifferentiated schizophrenia ([place]) [F20.3] 03/12/2016  . Breast cancer ([place]) [C50.919] 03/15/2015  . Iron deficiency anemia [D50.9] 03/07/2015  . Family history of breast cancer [Z80.3] 01/17/2015   History of Present Illness:  A  Identifying data. Karen Beard is a 45 year old female with a history of schizophrenia.  Chief complaint. "My mother was worried."  History of present illness. Information was obtained from the patient and the chart. She has a history of developmental disability and severl years ago she developed symptoms suggestive of schizophrenia. She has been lately  maintained on Zoloft by her primary provider. In the past week, she became increasingly delusional believing that a chip was implanted under her tongue that now broadcasts sounds. She is afraid that people are trying to kill her and that she is in witness protection program. She is packed and ready to go. She cropped her hair to change her appearance. The patient enies any symptoms of depression, anxiety or psychosis. She tells me that she feels better already. There is no alcohol or substances involved.   Past psychiatric history. She claims she was hospitalized once before but her mother denies it. No suicide attempts. There is a history of learning disability.  Family psychiatric history. None reported.  Social history. She lives with her mother. She has never been married, has no children. She is disabled from mental illness.  .   Is the patient at risk to self? No.  Has the patient been a risk to self in the past 6 months? No.  Has the patient been a risk to self within the distant past? No.  Is the patient a risk to  others? No.  Has the patient been a risk to others in the past 6 months? No.  Has the patient been a risk to others within the distant past? No.   Prior Inpatient Therapy:   Prior Outpatient Therapy:    Alcohol Screening: 1. How often do you have a drink containing alcohol?: Never 9. Have you or someone else been injured as a result of your drinking?: No 10. Has a relative or friend or a doctor or another health worker been concerned about your drinking or suggested you cut down?: No Alcohol Use Disorder Identification Test Final Score (AUDIT): 0 Brief Intervention: AUDIT score less than 7 or less-screening does not suggest unhealthy drinking-brief intervention not indicated Substance Abuse History in the last 12 months:  No. Consequences of Substance Abuse: NA Previous Psychotropic Medications: Yes  Psychological Evaluations: No  Past Medical History:  Past Medical History:  Diagnosis Date  . Breast cancer ([place]) 01/2015   right breast lumpectomy with rad tx  . 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 Depression   . Edema of both legs   . GERD (gastroesophageal reflux disease)   . Hypertension    RECENTLY TAKEN OFF OF MEDS PER MOM   . Learning disability   . Migraine   . 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 AXILLARY HIDRADENITIS EXCISION     [facility]  . AXILLARY SENTINEL NODE BIOPSY Right 02/23/2015   Procedure: AXILLARY SN BIOPSY;  Dr Jamal Collin, MD;  [facility];  ISOLATED TUMOR CELLS IN ONE LYMPH NODE (1/1).  Marland Kitchen BREAST BIOPSY Right 01/15/2016   stereo byrnett  . BREAST DUCTAL SYSTEM EXCISION Bilateral 01/26/2015   Procedure: BILATERAL EXCISION DUCTAL SYSTEM BREAST;  Surgeon: Christene Lye, MD;  Location: [facility] ORS;  Service: General;  Laterality: Bilateral;  . BREAST LUMPECTOMY Right 02/06/2015   Procedure: BREAST LUMPECTOMY;  Surgeon: Christene Lye, MD;  Location: [facility] ORS;   Service: General;  Laterality: Right;  . CARPAL TUNNEL RELEASE    . EXCISION / BIOPSY BREAST / NIPPLE / DUCT Bilateral 01/26/2015   left breast negative. right breast positive  . INGUINAL HIDRADENITIS EXCISION     Family History:  Family History  Problem Relation Age of Onset  . Breast cancer Sister 57  . Testicular cancer Father     Tobacco Screening: Have you used any form of tobacco in the last 30 days? (Cigarettes, Smokeless Tobacco, Cigars, and/or Pipes): No Social History:  History  Alcohol Use No     History  Drug Use No    Additional Social History:                           Allergies:   Allergies  Allergen Reactions  . Cephalexin Nausea And Vomiting  . Chocolate Swelling  . Corn Oil Other (See Comments)  . Peanut Oil Other (See Comments)  . Phenobarbital Other (See Comments)  . Sumatriptan Other (See Comments)  . Vilazodone Hcl Nausea Only  . Duloxetine Hcl Anxiety    agitation  . Latex Rash  . Tape Rash   Lab Results:  Results for orders placed or performed during the hospital encounter of 03/11/16 (from the past 48 hour(s))  Comprehensive metabolic panel     Status: Abnormal   Collection Time: 03/11/16 11:49 AM  Result Value Ref Range   Sodium 137 135 - 145 mmol/L   Potassium 3.4 (L) 3.5 - 5.1 mmol/L   Chloride 104 101 - 111 mmol/L   CO2 25 22 - 32 mmol/L   Glucose, Bld 124 (H) 65 - 99 mg/dL   BUN 12 6 - 20 mg/dL   Creatinine, Ser 0.62 0.44 - 1.00 mg/dL   Calcium 8.8 (L) 8.9 - 10.3 mg/dL   Total Protein 7.0 6.5 - 8.1 g/dL   Albumin 3.8 3.5 - 5.0 g/dL   AST 21 15 - 41 U/L   ALT 13 (L) 14 - 54 U/L   Alkaline Phosphatase 41 38 - 126 U/L   Total Bilirubin 0.8 0.3 - 1.2 mg/dL   GFR calc non Af Amer >60 >60 mL/min   GFR calc Af Amer >60 >60 mL/min    Comment: (NOTE) The eGFR has been calculated using the CKD EPI equation. This calculation has not been validated in all clinical situations. eGFR's persistently <60 mL/min signify possible  Chronic Kidney Disease.    Anion gap 8 5 - 15  Ethanol     Status: None   Collection Time: 03/11/16 11:49 AM  Result Value Ref Range   Alcohol, Ethyl (B) <5 <5 mg/dL    Comment:        LOWEST DETECTABLE LIMIT FOR SERUM ALCOHOL IS 5 mg/dL FOR MEDICAL PURPOSES ONLY   Salicylate level     Status: None   Collection Time: 03/11/16 11:49 AM  Result Value Ref Range   Salicylate Lvl <0.9 2.8 - 30.0 mg/dL  Acetaminophen level     Status: Abnormal  Collection Time: 03/11/16 11:49 AM  Result Value Ref Range   Acetaminophen (Tylenol), Serum <10 (L) 10 - 30 ug/mL    Comment:        THERAPEUTIC CONCENTRATIONS VARY SIGNIFICANTLY. A RANGE OF 10-30 ug/mL MAY BE AN EFFECTIVE CONCENTRATION FOR MANY PATIENTS. HOWEVER, SOME ARE BEST TREATED AT CONCENTRATIONS OUTSIDE THIS RANGE. ACETAMINOPHEN CONCENTRATIONS >150 ug/mL AT 4 HOURS AFTER INGESTION AND >50 ug/mL AT 12 HOURS AFTER INGESTION ARE OFTEN ASSOCIATED WITH TOXIC REACTIONS.   cbc     Status: None   Collection Time: 03/11/16 11:49 AM  Result Value Ref Range   WBC 5.2 3.6 - 11.0 K/uL   RBC 4.69 3.80 - 5.20 MIL/uL   Hemoglobin 13.1 12.0 - 16.0 g/dL   HCT 37.8 35.0 - 47.0 %   MCV 80.7 80.0 - 100.0 fL   MCH 28.0 26.0 - 34.0 pg   MCHC 34.7 32.0 - 36.0 g/dL   RDW 13.0 11.5 - 14.5 %   Platelets 234 150 - 440 K/uL  hCG, quantitative, pregnancy     Status: None   Collection Time: 03/11/16 11:49 AM  Result Value Ref Range   hCG, Beta Chain, Quant, S 1 <5 mIU/mL    Comment:          GEST. AGE      CONC.  (mIU/mL)   <=1 WEEK        5 - 50     2 WEEKS       50 - 500     3 WEEKS       100 - 10,000     4 WEEKS     1,000 - 30,000     5 WEEKS     3,500 - 115,000   6-8 WEEKS     12,000 - 270,000    12 WEEKS     15,000 - 220,000        FEMALE AND NON-PREGNANT FEMALE:     LESS THAN 5 mIU/mL   TSH     Status: None   Collection Time: 03/11/16 11:49 AM  Result Value Ref Range   TSH 1.465 0.350 - 4.500 uIU/mL  Urine Drug Screen, Qualitative      Status: None   Collection Time: 03/11/16 11:55 AM  Result Value Ref Range   Tricyclic, Ur Screen NONE DETECTED NONE DETECTED   Amphetamines, Ur Screen NONE DETECTED NONE DETECTED   MDMA (Ecstasy)Ur Screen NONE DETECTED NONE DETECTED   Cocaine Metabolite,Ur [place] NONE DETECTED NONE DETECTED   Opiate, Ur Screen NONE DETECTED NONE DETECTED   Phencyclidine (PCP) Ur S NONE DETECTED NONE DETECTED   Cannabinoid 50 Ng, Ur [place] NONE DETECTED NONE DETECTED   Barbiturates, Ur Screen NONE DETECTED NONE DETECTED   Benzodiazepine, Ur Scrn NONE DETECTED NONE DETECTED   Methadone Scn, Ur NONE DETECTED NONE DETECTED    Comment: (NOTE) 468  Tricyclics, urine               Cutoff 1000 ng/mL 200  Amphetamines, urine             Cutoff 1000 ng/mL 300  MDMA (Ecstasy), urine           Cutoff 500 ng/mL 400  Cocaine Metabolite, urine       Cutoff 300 ng/mL 500  Opiate, urine                   Cutoff 300 ng/mL 600  Phencyclidine (PCP), urine  Cutoff 25 ng/mL 700  Cannabinoid, urine              Cutoff 50 ng/mL 800  Barbiturates, urine             Cutoff 200 ng/mL 900  Benzodiazepine, urine           Cutoff 200 ng/mL 1000 Methadone, urine                Cutoff 300 ng/mL 1100 1200 The urine drug screen provides only a preliminary, unconfirmed 1300 analytical test result and should not be used for non-medical 1400 purposes. Clinical consideration and professional judgment should 1500 be applied to any positive drug screen result due to possible 1600 interfering substances. A more specific alternate chemical method 1700 must be used in order to obtain a confirmed analytical result.  1800 Gas chromato graphy / mass spectrometry (GC/MS) is the preferred 1900 confirmatory method.     Blood Alcohol level:  Lab Results  Component Value Date   ETH <5 96/09/5407    Metabolic Disorder Labs:  No results found for: HGBA1C, MPG No results found for: PROLACTIN No results found for: CHOL, TRIG, HDL, CHOLHDL,  VLDL, LDLCALC  Current Medications: Current Facility-Administered Medications  Medication Dose Route Frequency Provider Last Rate Last Dose  . acetaminophen (TYLENOL) tablet 650 mg  650 mg Oral Q6H PRN Gonzella Lex, MD   650 mg at 03/12/16 1317  . alum & mag hydroxide-simeth (MAALOX/MYLANTA) 200-200-20 MG/5ML suspension 30 mL  30 mL Oral Q4H PRN Gonzella Lex, MD      . hydrOXYzine (ATARAX/VISTARIL) tablet 25 mg  25 mg Oral TID PRN Gonzella Lex, MD      . Derrill Memo ON 03/13/2016] losartan (COZAAR) tablet 25 mg  25 mg Oral Daily Jolanta B Pucilowska, MD      . magnesium hydroxide (MILK OF MAGNESIA) suspension 30 mL  30 mL Oral Daily PRN Gonzella Lex, MD      . risperiDONE (RISPERDAL) tablet 3 mg  3 mg Oral QHS Jolanta B Pucilowska, MD      . sertraline (ZOLOFT) tablet 100 mg  100 mg Oral Daily Jolanta B Pucilowska, MD      . tamoxifen (NOLVADEX) tablet 25 mg  25 mg Oral BID Jolanta B Pucilowska, MD      . traZODone (DESYREL) tablet 100 mg  100 mg Oral QHS PRN Gonzella Lex, MD       PTA Medications: Prescriptions Prior to Admission  Medication Sig Dispense Refill Last Dose  . acetaminophen (TYLENOL) 500 MG tablet Take 500 mg by mouth as needed. Reported on 07/27/2015   Taking  . aspirin-acetaminophen-caffeine (EXCEDRIN MIGRAINE) 250-250-65 MG per tablet Take 1 tablet by mouth every 6 (six) hours as needed for headache. Reported on 07/27/2015   Taking  . cetirizine (ZYRTEC) 10 MG tablet Take 10 mg by mouth daily as needed for allergies.   Taking  . cyanocobalamin (,VITAMIN B-12,) 1000 MCG/ML injection Inject into the muscle.   Taking  . losartan-hydrochlorothiazide (HYZAAR) 50-12.5 MG tablet Take by mouth.   Taking  . polyethylene glycol (MIRALAX) packet Take 17 g by mouth daily. 14 each 0 Taking  . sertraline (ZOLOFT) 50 MG tablet    Taking  . tamoxifen (NOLVADEX) 20 MG tablet Take 1 tablet (20 mg total) by mouth daily. 30 tablet 11 Taking    Musculoskeletal: Strength & Muscle Tone:  within normal limits Gait & Station: normal Patient leans: N/A  Psychiatric  Specialty Exam: I reviewed physical exam performed in the emergency room and agree with the findings.. Physical Exam  Nursing note and vitals reviewed.   Review of Systems  Psychiatric/Behavioral: Positive for hallucinations. The patient is nervous/anxious.   All other systems reviewed and are negative.   Blood pressure 120/81, pulse 87, temperature 98 F (36.7 C), temperature source Oral, resp. rate 16, height 5' 2" (1.575 m), weight 71.2 kg (157 lb), SpO2 100 %.Body mass index is 28.72 kg/m.  See SRA.                                                      Treatment Plan Summary: Daily contact with patient to assess and evaluate symptoms and progress in treatment and Medication management   Ms. Symonds is a 45 year old female with a history of schizophrenia admitted for psychotic break.  1. Psychosis. We'll start Risperdal tonight.  2. Mood. She's been maintained on Zoloft we will continue.    3. Breast cancer. She is on tamoxifen.  4. Hypertension. She is on losartan.  5. Migraine headaches. She takes Excedrin at home and is allergic to Imitrex.  6. Metabolic syndrome monitoring. Lipid profile, TSH, hemoglobin A1c are pending.  7. Disposition. She will be discharged to home with her mother. She will follow up with a new psychiatrist.     Observation Level/Precautions:  15 minute checks  Laboratory:  CBC Chemistry Profile UDS UA  Psychotherapy:    Medications:    Consultations:    Discharge Concerns:    Estimated LOS:  Other:     Physician Treatment Plan for Primary Diagnosis: Undifferentiated schizophrenia ([place]) Long Term Goal(s): Improvement in symptoms so as ready for discharge  Short Term Goals: Ability to identify changes in lifestyle to reduce recurrence of condition will improve, Ability to verbalize feelings will improve, Ability to disclose and discuss  suicidal ideas, Ability to demonstrate self-control will improve, Ability to identify and develop effective coping behaviors will improve and Ability to maintain clinical measurements within normal limits will improve  Physician Treatment Plan for Secondary Diagnosis: Principal Problem:   Undifferentiated schizophrenia ([place]) Active Problems:   Breast cancer ([place])  Long Term Goal(s): NA  Short Term Goals: NA  I certify that inpatient services furnished can reasonably be expected to improve the patient's condition.    Orson Slick, MD 9/26/20176:50 PM

## 2016-03-12 NOTE — ED Notes (Signed)

## 2016-03-12 NOTE — Tx Team (Signed)
Initial Treatment Plan 03/12/2016 11:42 AM Karen Beard [id number]     PATIENT STRESSORS: Loss of father Marital or family conflict   PATIENT STRENGTHS: Motivation for treatment/growth Supportive family/friends   PATIENT IDENTIFIED PROBLEMS: Schizophrenia, paranoid                     DISCHARGE CRITERIA:  Improved stabilization in mood, thinking, and/or behavior Need for constant or close observation no longer present  PRELIMINARY DISCHARGE PLAN: Outpatient therapy  PATIENT/FAMILY INVOLVEMENT: This treatment plan has been presented to and reviewed with the patient, Karen Beard, and/or family member.  The patient and family have been given the opportunity to ask questions and make suggestions.  Andria Frames, RN 03/12/2016, 11:42 AM

## 2016-03-13 LAB — LIPID PANEL
CHOL/HDL RATIO: 5.5 ratio
Cholesterol: 202 mg/dL — ABNORMAL HIGH (ref 0–200)
HDL: 37 mg/dL — AB (ref >40)
LDL CALC: 109 mg/dL — AB (ref 0–99)
TRIGLYCERIDES: 279 mg/dL — AB (ref <150)
VLDL: 56 mg/dL — AB (ref 0–40)

## 2016-03-13 LAB — TSH: TSH: 1.864 u[IU]/mL (ref 0.350–4.500)

## 2016-03-13 NOTE — Progress Notes (Signed)
   03/13/16 1300  Clinical Encounter Type  Visited With Patient;Health care provider  Visit Type Initial;Follow-up;Psychological support;Spiritual support;Social support  Referral From Care management  Consult/Referral To Chaplain  Recommendations I think being available and willing to listen to [facility] when she wants to talk is good.   Spiritual Encounters  Spiritual Needs Prayer;Emotional  Stress Factors  Patient Stress Factors Family relationships;Health changes  Family Stress Factors Health changes  Advance Directives (For Healthcare)  Does patient have an advance directive? No  Would patient like information on creating an advanced directive? No - patient declined information    Rochelle attended group and seemed very comfortable. She spoke about her mother and how she misses her. Kieona has a mental health diagnosis that she is continuing to learn how to live with. She in conversation seems optimistic to work through the challenges, but also seems not to completely understand the magnitude and subsequent challenges of what that entails. She communicates as if her mother is her main Caregiver and has a very influential relationship with her, thus the longing. We spoke again this morning and she seemed to be okay. I will continue to follow-up and check in on her while performing rounds. She does have a religious background and tradition that she talks about and finds hope in, that seems to aid her to a degree.

## 2016-03-13 NOTE — Progress Notes (Signed)
Patient with blunted affect, cooperative behavior with meals, meds and plan of care. No SI/HI/AVH at this time. Patient does not voice any delusions or paranoia at this time. Does well on unit with encouragement and support. Denies nausea or vomiting at this time. Safety maintained.

## 2016-03-13 NOTE — [facility] Counselor (Signed)
Adult Comprehensive Assessment  Patient ID: Karen Beard, female   DOB: 08/08/1970, 45 y.o.   MRN: [id number]  Information Source: Information source: Patient  Current Stressors:  Educational / Learning stressors: Pt denies Employment / Job issues: Pt is on disability due to a learning disability Family Relationships: Pt denies Museum/gallery curator / Lack of resources (include bankruptcy): Pt denies Housing / Lack of housing: Pt denies Physical health (include injuries & life threatening diseases): Pt reports arthritis, carpal tunnel syndrome and is recovering from breast cancer Social relationships: Pt has concerns that someone was trying to hurt or kill her, but pt no longer feels this is a concern Substance abuse: Pt denies Bereavement / Loss: Pt denies  Living/Environment/Situation:  Living Arrangements: Parent Living conditions (as described by patient or guardian): Pt loves living with her mother who pt considers to be her best friend How long has patient lived in current situation?: Pt reports 6-7 years What is atmosphere in current home: Comfortable, Loving  Family History:  Marital status: Married (Pt reports her mother "doesn't remember" her being married) Number of Years Married: 31 What types of issues is patient dealing with in the relationship?: Pt reports none Additional relationship information: Pt reports she has been married for ten years,and separated for ten years Does patient have children?: Yes How many children?: 1 How is patient's relationship with their children?: Pt reports one female child who is in foster-care  Childhood History:  By whom was/is the patient raised?: Both parents Description of patient's relationship with caregiver when they were a child: Pt was "really close" with both Patient's description of current relationship with people who raised him/her: Pt reports father dies 04-11-2013, relationship with mother still "really great" Does patient have  siblings?: Yes Number of Siblings: 1 Description of patient's current relationship with siblings: "Really good, we are close" per the pt Did patient suffer any verbal/emotional/physical/sexual abuse as a child?: No Did patient suffer from severe childhood neglect?: No Has patient ever been sexually abused/assaulted/raped as an adolescent or adult?: No Was the patient ever a victim of a crime or a disaster?: No Witnessed domestic violence?: No Has patient been effected by domestic violence as an adult?: No  Education:  Highest grade of school patient has completed: 12th grade Learning disability?: Yes What learning problems does patient have?: Pt reports she was told she had learning disability in math and reading  Employment/Work Situation:   Employment situation: On disability Why is patient on disability: Learning disability How long has patient been on disability: Pt says " a long time".  Pt is a vague historian What is the longest time patient has a held a job?: One year Where was the patient employed at that time?: [employer] Has patient ever been in the [employer]?: No  Financial Resources:   Does patient have a Programmer, applications or guardian?: Yes (Pt reports her mother "considers herself her legal guardian") Name of representative payee or guardian: Pt's mother Zamarah Quist [phone] and her sister Twana First ph [phone]  Alcohol/Substance Abuse:   What has been your use of drugs/alcohol within the last 12 months?: Pt denies the use of alcohol of any alcohol and/or other substances If attempted suicide, did drugs/alcohol play a role in this?: No Alcohol/Substance Abuse Treatment Hx: Denies past history Has alcohol/substance abuse ever caused legal problems?: No  Social Support System:   Patient's Community Support System: Good Describe Community Support System: Mother per the pt Type of faith/religion: Pt  reports she is Baptist How does  patient's faith help to cope with current illness?: Prayer  Leisure/Recreation:   Leisure and Hobbies: Pt reports she likes to excercise and spend time with her dog  Strengths/Needs:   What things does the patient do well?: Pt enjoys playing on the computer In what areas does patient struggle / problems for patient: Pt becomes confused at times  Discharge Plan:   Does patient have access to transportation?: Yes (Pt's mother) Will patient be returning to same living situation after discharge?: Yes (Pt will live with her mother again) Currently receiving community mental health services: No If no, would patient like referral for services when discharged?:  [facility]) Does patient have financial barriers related to discharge medications?: No  Summary/Recommendations:   Summary and Recommendations (to be completed by the evaluator): Patient presented to the hospital voluntarily with paranoia and was admitted for, depression, suicidal ideation, delusional thinking and hallucinations.  Pt's primary diagnosis is Undifferentiated schizophrenia ([place]).  Pt reports her primary trigger for admission were worries from a relative for the pt's safety.  Pt reports her stressors are concerns over her safety due to her perception that others mean her harm.  Pt now denies SI/HI/AVH.  Patient lives in [place], [place].  Pt lists supports in the community as her mother.  Patient will benefit from crisis stabilization, medication evaluation, group therapy, and psycho education in addition to case management for discharge planning. Patient and CSW reviewed pt.'s identified goals and treatment plan. Pt verbalized understanding and agreed to treatment plan.  At discharge it is recommended that patient remain compliant with established plan and continue treatment.  Alphonse Guild Avion Kutzer. 03/13/2016

## 2016-03-13 NOTE — Plan of Care (Signed)
Problem: Health Behavior/Discharge Planning: Goal: Compliance with treatment plan for underlying cause of condition will improve Outcome: Progressing Pt takes evening medications as prescribed.  Problem: Safety: Goal: Periods of time without injury will increase Outcome: Progressing Pt remains free from harm.

## 2016-03-13 NOTE — Progress Notes (Signed)
Recreation Therapy Notes  Date: 09.27.17 Time: 9:30 am Location: Craft Room  Group Topic: Self-esteem  Goal Area(s) Addresses:  Patient will write positive traits about self. Patient will verbalize benefit of having a healthy self-esteem.  Behavioral Response: Attentive, Interactive  Intervention: I Am  Activity: Patients were given a worksheet with the letter I on it and were instructed to write as many positive traits about themselves inside the letter.  Education: LRT educated patients on ways they can increase their self-esteem.  Education Outcome: In group clarification offered  Clinical Observations/Feedback: Patient wrote positive traits about self. Patient contributed to group discussion by stating that it was difficult to think of positive traits and ways she can increase her self-esteem.  Leonette Monarch, LRT/CTRS 03/13/2016 10:31 AM

## 2016-03-13 NOTE — Tx Team (Signed)
Interdisciplinary Treatment and Diagnostic Plan Update  03/13/2016 Time of Session: 10:52 am Karen Beard MRN: [id number]  Principal Diagnosis: Undifferentiated schizophrenia [facility])  Secondary Diagnoses: Principal Problem:   Undifferentiated schizophrenia ([place]) Active Problems:   Breast cancer ([place])   Migraine headache   Current Medications:  Current Facility-Administered Medications  Medication Dose Route Frequency Provider Last Rate Last Dose  . acetaminophen (TYLENOL) tablet 650 mg  650 mg Oral Q6H PRN Gonzella Lex, MD   650 mg at 03/12/16 2117  . alum & mag hydroxide-simeth (MAALOX/MYLANTA) 200-200-20 MG/5ML suspension 30 mL  30 mL Oral Q4H PRN Gonzella Lex, MD      . hydrOXYzine (ATARAX/VISTARIL) tablet 25 mg  25 mg Oral TID PRN Gonzella Lex, MD      . losartan (COZAAR) tablet 25 mg  25 mg Oral Daily Jolanta B Pucilowska, MD   25 mg at 03/13/16 0755  . magnesium hydroxide (MILK OF MAGNESIA) suspension 30 mL  30 mL Oral Daily PRN Gonzella Lex, MD      . risperiDONE (RISPERDAL) tablet 3 mg  3 mg Oral QHS Clovis Fredrickson, MD   3 mg at 03/12/16 2117  . sertraline (ZOLOFT) tablet 100 mg  100 mg Oral Daily Jolanta B Pucilowska, MD   100 mg at 03/13/16 0755  . tamoxifen (NOLVADEX) tablet 20 mg  20 mg Oral Daily Jolanta B Pucilowska, MD   20 mg at 03/13/16 0755  . traZODone (DESYREL) tablet 100 mg  100 mg Oral QHS PRN Gonzella Lex, MD       PTA Medications: Prescriptions Prior to Admission  Medication Sig Dispense Refill Last Dose  . acetaminophen (TYLENOL) 500 MG tablet Take 500 mg by mouth as needed. Reported on 07/27/2015   Taking  . aspirin-acetaminophen-caffeine (EXCEDRIN MIGRAINE) 250-250-65 MG per tablet Take 1 tablet by mouth every 6 (six) hours as needed for headache. Reported on 07/27/2015   Taking  . cetirizine (ZYRTEC) 10 MG tablet Take 10 mg by mouth daily as needed for allergies.   Taking  . cyanocobalamin (,VITAMIN B-12,) 1000 MCG/ML injection Inject into  the muscle.   Taking  . losartan-hydrochlorothiazide (HYZAAR) 50-12.5 MG tablet Take by mouth.   Taking  . polyethylene glycol (MIRALAX) packet Take 17 g by mouth daily. 14 each 0 Taking  . sertraline (ZOLOFT) 50 MG tablet    Taking  . tamoxifen (NOLVADEX) 20 MG tablet Take 1 tablet (20 mg total) by mouth daily. 30 tablet 11 Taking    Patient Stressors: Loss of father Marital or family conflict  Patient Strengths: Motivation for treatment/growth Supportive family/friends  Treatment Modalities: Medication Management, Group therapy, Case management,  1 to 1 session with clinician, Psychoeducation, Recreational therapy.   Physician Treatment Plan for Primary Diagnosis: Undifferentiated schizophrenia ([place]) Long Term Goal(s): Improvement in symptoms so as ready for discharge NA   Short Term Goals: Ability to identify changes in lifestyle to reduce recurrence of condition will improve Ability to verbalize feelings will improve Ability to disclose and discuss suicidal ideas Ability to demonstrate self-control will improve Ability to identify and develop effective coping behaviors will improve Ability to maintain clinical measurements within normal limits will improve NA  Medication Management: Evaluate patient's response, side effects, and tolerance of medication regimen.  Therapeutic Interventions: 1 to 1 sessions, Unit Group sessions and Medication administration.  Evaluation of Outcomes: Progressing  Physician Treatment Plan for Secondary Diagnosis: Principal Problem:   Undifferentiated schizophrenia ([place]) Active Problems:   Breast  cancer ([place])   Migraine headache  Long Term Goal(s): Improvement in symptoms so as ready for discharge NA   Short Term Goals: Ability to identify changes in lifestyle to reduce recurrence of condition will improve Ability to verbalize feelings will improve Ability to disclose and discuss suicidal ideas Ability to demonstrate self-control will  improve Ability to identify and develop effective coping behaviors will improve Ability to maintain clinical measurements within normal limits will improve NA     Medication Management: Evaluate patient's response, side effects, and tolerance of medication regimen.  Therapeutic Interventions: 1 to 1 sessions, Unit Group sessions and Medication administration.  Evaluation of Outcomes: Progressing   RN Treatment Plan for Primary Diagnosis: Undifferentiated schizophrenia ([place]) Long Term Goal(s): Knowledge of disease and therapeutic regimen to maintain health will improve  Short Term Goals: Ability to verbalize frustration and anger appropriately will improve, Ability to participate in decision making will improve, Ability to verbalize feelings will improve, Ability to disclose and discuss suicidal ideas and Ability to identify and develop effective coping behaviors will improve  Medication Management: RN will administer medications as ordered by provider, will assess and evaluate patient's response and provide education to patient for prescribed medication. RN will report any adverse and/or side effects to prescribing provider.  Therapeutic Interventions: 1 on 1 counseling sessions, Psychoeducation, Medication administration, Evaluate responses to treatment, Monitor vital signs and CBGs as ordered, Perform/monitor CIWA, COWS, AIMS and Fall Risk screenings as ordered, Perform wound care treatments as ordered.  Evaluation of Outcomes: Progressing   LCSW Treatment Plan for Primary Diagnosis: Undifferentiated schizophrenia ([employer]) Long Term Goal(s): Safe transition to appropriate next level of care at discharge, Engage patient in therapeutic group addressing interpersonal concerns.  Short Term Goals: Engage patient in aftercare planning with referrals and resources, Increase social support, Increase ability to appropriately verbalize feelings, Increase emotional regulation, Facilitate acceptance of  mental health diagnosis and concerns and Increase skills for wellness and recovery  Therapeutic Interventions: Assess for all discharge needs, 1 to 1 time with Social worker, Explore available resources and support systems, Assess for adequacy in community support network, Educate family and significant other(s) on suicide prevention, Complete Psychosocial Assessment, Interpersonal group therapy.  Evaluation of Outcomes: Progressing   Progress in Treatment: Attending groups: No. Participating in groups: No. Taking medication as prescribed: Yes. Toleration medication: Yes. Family/Significant other contact made: Yes, individual(s) contacted:  No, CSw still assessing Patient understands diagnosis: Yes. Discussing patient identified problems/goals with staff: Yes. Medical problems stabilized or resolved: Yes. Denies suicidal/homicidal ideation: Yes. Issues/concerns per patient self-inventory: No. Other:   New problem(s) identified: No  New Short Term/Long Term Goal(s):na  Discharge Plan or Barriers: CSW still assessing for appropriate referrals   Reason for Continuation of Hospitalization: Anxiety Delusions  Depression Hallucinations Suicidal ideation  Estimated Length of Stay:  Attendees: Patient: Karen Beard 03/13/2016 9:39 AM  Physician: Dr. Bary Leriche, MD 03/13/2016 9:39 AM  Nursing: Carolynn Sayers, RN 03/13/2016 9:39 AM  RN Care Manager: 03/13/2016 9:39 AM  Social Worker: Alphonse Guild. Vickie Epley 03/13/2016 9:39 AM  Recreational Therapist: Everitt Amber, LRT 03/13/2016 9:39 AM  Social Worker: Glorious Peach, [place] 03/13/2016 9:39 AM  Social Worker: Dossie Arbour, LCSW 03/13/2016 9:39 AM  Other: 03/13/2016 9:39 AM    Scribe for Treatment Team: Alphonse Guild Clavin Ruhlman, LCSWA 03/13/2016 9:39 AM

## 2016-03-13 NOTE — Progress Notes (Signed)
D: Pt is seen in her room sleeping most of this evening. She is pleasant on approach. Pt c/o h/a and requests PRN medication. She reports that when she has a h/a she often vomits and has to "swallow it down." She rates anxiety and depression 0/10. Denies SI/HI/AVH at this time. Pt denies feeling as though someone is wanting to kill her, as she previously has verbalized. A: Emotional support and encouragement provided. Medications administered with education. q15 minute safety checks maintained. R: Pt remains free from harm. Will continue to monitor.

## 2016-03-13 NOTE — BHH Group Notes (Signed)
[place] LCSW Group Therapy   03/13/2016  1:00 pm   Type of Therapy: Group Therapy   Participation Level: Active   Participation Quality: Attentive, Sharing and Supportive   Affect: Appropriate   Cognitive: Alert and Oriented   Insight: Developing/Improving and Engaged   Engagement in Therapy: Developing/Improving and Engaged   Modes of Intervention: Clarification, Confrontation, Discussion, Education, Exploration, Limit-setting, Orientation, Problem-solving, Rapport Building, Art therapist, Socialization and Support   Summary of Progress/Problems: The topic for group today was emotional regulation. This group focused on both positive and negative emotion identification and allowed  group members to process ways to identify feelings, regulate negative emotions, and find healthy ways to manage internal/external emotions. Group members were asked to reflect on a time when their reaction to an emotion led to a negative outcome and explored how alternative responses using emotion regulation would have benefited them. Group members were also asked to discuss a time when emotion regulation was utilized when a negative emotion was experienced. Pt defined emotion regulation as "making change in emotions." She stated that the emotion that she felt before coming into the hospital was confusion and scared. She stated to regulate those negative emotions she would benefit from visiting [employer], reading, and talking to someone supportive.      Glorious Peach, MSW, LCSWA 03/13/2016, 2:11PM

## 2016-03-13 NOTE — Plan of Care (Signed)
Problem: Safety: Goal: Ability to remain free from injury will improve Outcome: Progressing Safety maintained.

## 2016-03-13 NOTE — BHH Suicide Risk Assessment (Signed)
[place] INPATIENT:  Family/Significant Other Suicide Prevention Education  Suicide Prevention Education:  Education Completed; with the pt's mother Achante Sturdy at ph: [phone],  (name of family member/significant other) has been identified by the patient as the family member/significant other with whom the patient will be residing, and identified as the person(s) who will aid the patient in the event of a mental health crisis (suicidal ideations/suicide attempt).  With written consent from the patient, the family member/significant other has been provided the following suicide prevention education, prior to the and/or following the discharge of the patient.  The suicide prevention education provided includes the following:  Suicide risk factors  Suicide prevention and interventions  National Suicide Hotline telephone number  [facility] assessment telephone number  [facility] Emergency Assistance [place] and/or Residential Mobile Crisis Unit telephone number  Request made of family/significant other to:  Remove weapons (e.g., guns, rifles, knives), all items previously/currently identified as safety concern.    Remove drugs/medications (over-the-counter, prescriptions, illicit drugs), all items previously/currently identified as a safety concern.  The family member/significant other verbalizes understanding of the suicide prevention education information provided.  The family member/significant other agrees to remove the items of safety concern listed above.  Alphonse Guild Aminta Sakurai 03/13/2016, 3:01 PM

## 2016-03-13 NOTE — Progress Notes (Signed)
[facility] MD Progress Note  03/13/2016 1:43 PM Karen Beard  MRN:  [id number]  Subjective:  Karen Beard met with the treatment today. She apparently denies any psychotic and asks to be discharged. She came to the hospital for paranoid elusions and hallucinations. She was started on antipsychotic and seems to tolerate it well. Migraine headache she suffered yesterday resolved. She slept with trazodone last night and feels rested. Good program participation.  Principal Problem: Undifferentiated schizophrenia ([place]) Diagnosis:   Patient Active Problem List   Diagnosis Date Noted  . Undifferentiated schizophrenia ([place]) [F20.3] 03/12/2016  . Migraine headache [G43.909] 03/12/2016  . Breast cancer ([place]) [C50.919] 03/15/2015  . Iron deficiency anemia [D50.9] 03/07/2015  . Family history of breast cancer [Z80.3] 01/17/2015   Total Time spent with patient: 20 minutes  Past Psychiatric History: Schizophrenia.  Past Medical History:  Past Medical History:  Diagnosis Date  . Breast cancer ([place]) 01/2015   right breast lumpectomy with rad tx  . 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 Depression   . Edema of both legs   . GERD (gastroesophageal reflux disease)   . Hypertension    RECENTLY TAKEN OFF OF MEDS PER MOM   . Learning disability   . Migraine   . 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 AXILLARY HIDRADENITIS EXCISION     [facility]  . AXILLARY SENTINEL NODE BIOPSY Right 02/23/2015   Procedure: AXILLARY SN BIOPSY;  Dr Jamal Collin, MD;  [facility] ORS;  ISOLATED TUMOR CELLS IN ONE LYMPH NODE (1/1).  Marland Kitchen BREAST BIOPSY Right 01/15/2016   stereo byrnett  . BREAST DUCTAL SYSTEM EXCISION Bilateral 01/26/2015   Procedure: BILATERAL EXCISION DUCTAL SYSTEM BREAST;  Surgeon: Christene Lye, MD;  Location: [facility] ORS;  Service: General;  Laterality: Bilateral;  . BREAST LUMPECTOMY Right 02/06/2015   Procedure: BREAST  LUMPECTOMY;  Surgeon: Christene Lye, MD;  Location: [facility] ORS;  Service: General;  Laterality: Right;  . CARPAL TUNNEL RELEASE    . EXCISION / BIOPSY BREAST / NIPPLE / DUCT Bilateral 01/26/2015   left breast negative. right breast positive  . INGUINAL HIDRADENITIS EXCISION     Family History:  Family History  Problem Relation Age of Onset  . Breast cancer Sister 5  . Testicular cancer Father    Family Psychiatric  History: see H&P. Social History:  History  Alcohol Use No     History  Drug Use No    Social History   Social History  . Marital status: Single    Spouse name: N/A  . Number of children: N/A  . Years of education: N/A   Social History Main Topics  . Smoking status: Never Smoker  . Smokeless tobacco: Never Used  . Alcohol use No  . Drug use: No  . Sexual activity: Not Asked   Other Topics Concern  . None   Social History Narrative  . None   Additional Social History:                         Sleep: Fair  Appetite:  Fair  Current Medications: Current Facility-Administered Medications  Medication Dose Route Frequency Provider Last Rate Last Dose  . acetaminophen (TYLENOL) tablet 650 mg  650 mg Oral Q6H PRN Gonzella Lex, MD   650 mg at 03/12/16 2117  . alum & mag  hydroxide-simeth (MAALOX/MYLANTA) 200-200-20 MG/5ML suspension 30 mL  30 mL Oral Q4H PRN Gonzella Lex, MD      . hydrOXYzine (ATARAX/VISTARIL) tablet 25 mg  25 mg Oral TID PRN Gonzella Lex, MD      . losartan (COZAAR) tablet 25 mg  25 mg Oral Daily Aika Brzoska B Suheily Birks, MD   25 mg at 03/13/16 0755  . magnesium hydroxide (MILK OF MAGNESIA) suspension 30 mL  30 mL Oral Daily PRN Gonzella Lex, MD      . risperiDONE (RISPERDAL) tablet 3 mg  3 mg Oral QHS Clovis Fredrickson, MD   3 mg at 03/12/16 2117  . sertraline (ZOLOFT) tablet 100 mg  100 mg Oral Daily Kyrollos Cordell B Brevin Mcfadden, MD   100 mg at 03/13/16 0755  . tamoxifen (NOLVADEX) tablet 20 mg  20 mg Oral Daily Natan Hartog B  Mad[place] Twaddle, MD   20 mg at 03/13/16 0755  . traZODone (DESYREL) tablet 100 mg  100 mg Oral QHS PRN Gonzella Lex, MD        Lab Results:  Results for orders placed or performed during the hospital encounter of 03/12/16 (from the past 48 hour(s))  Lipid panel     Status: Abnormal   Collection Time: 03/13/16  6:43 AM  Result Value Ref Range   Cholesterol 202 (H) 0 - 200 mg/dL   Triglycerides 279 (H) <150 mg/dL   HDL 37 (L) >40 mg/dL   Total CHOL/HDL Ratio 5.5 RATIO   VLDL 56 (H) 0 - 40 mg/dL   LDL Cholesterol 109 (H) 0 - 99 mg/dL    Comment:        Total Cholesterol/HDL:CHD Risk Coronary Heart Disease Risk Table                     Men   Women  1/2 Average Risk   3.4   3.3  Average Risk       5.0   4.4  2 X Average Risk   9.6   7.1  3 X Average Risk  23.4   11.0        Use the calculated Patient Ratio above and the CHD Risk Table to determine the patient's CHD Risk.        ATP III CLASSIFICATION (LDL):  <100     mg/dL   Optimal  100-129  mg/dL   Near or Above                    Optimal  130-159  mg/dL   Borderline  160-189  mg/dL   High  >190     mg/dL   Very High   TSH     Status: None   Collection Time: 03/13/16  6:43 AM  Result Value Ref Range   TSH 1.864 0.350 - 4.500 uIU/mL    Blood Alcohol level:  Lab Results  Component Value Date   ETH <5 88/91/6945    Metabolic Disorder Labs: No results found for: HGBA1C, MPG No results found for: PROLACTIN Lab Results  Component Value Date   CHOL 202 (H) 03/13/2016   TRIG 279 (H) 03/13/2016   HDL 37 (L) 03/13/2016   CHOLHDL 5.5 03/13/2016   VLDL 56 (H) 03/13/2016   LDLCALC 109 (H) 03/13/2016    Physical Findings: AIMS:  , ,  ,  ,    CIWA:    COWS:     Musculoskeletal: Strength & Muscle Tone: within normal limits Gait &  Station: normal Patient leans: N/A  Psychiatric Specialty Exam: Physical Exam  Nursing note and vitals reviewed.   Review of Systems  Psychiatric/Behavioral: Positive for depression and  hallucinations. The patient has insomnia.   All other systems reviewed and are negative.   Blood pressure 122/75, pulse 81, temperature 98.6 F (37 C), resp. rate 18, height _0  (1.575 m), weight 71.2 kg (157 lb), SpO2 100 %.Body mass index is 28.72 kg/m.  General Appearance: Casual  Eye Contact:  Good  Speech:  Clear and Coherent  Volume:  Normal  Mood:  Anxious  Affect:  Appropriate  Thought Process:  Goal Directed and Descriptions of Associations: Intact  Orientation:  Full (Time, Place, and Person)  Thought Content:  Delusions, Hallucinations: Auditory and Paranoid Ideation  Suicidal Thoughts:  No  Homicidal Thoughts:  No  Memory:  Immediate;   Fair Recent;   Fair Remote;   Fair  Judgement:  Impaired  Insight:  Lacking  Psychomotor Activity:  Normal  Concentration:  Concentration: Fair and Attention Span: Fair  Recall:  [employer] of Knowledge:  Fair  Language:  Fair  Akathisia:  No  Handed:  Right  AIMS (if indicated):     Assets:  Communication Skills Desire for Improvement Financial Resources/Insurance Housing Physical Health Resilience Social Support  ADL's:  Intact  Cognition:  WNL  Sleep:  Number of Hours: 8.5     Treatment Plan Summary: Daily contact with patient to assess and evaluate symptoms and progress in treatment and Medication management   Karen Beard is a 45 year old female with a history of schizophrenia admitted for psychotic break.  1. Psychosis. We started Risperdal tonight.  2. Mood. We continued Zoloft.    3. Breast cancer. She is on tamoxifen.  4. Hypertension. She is on losartan.  5. Migraine headaches. Resolved. Will start Topamax for migraine prevention.  6. Metabolic syndrome monitoring. Lipid profile shows elevated triglycerides,TSH and hemoglobin A1c are normal.   7. EKG. Normal sinus rhythm. QT 398.   8. Insomnia. Resolved with Trazodone.   9. Disposition. She will be discharged to home with her mother. She  will follow up with a new psychiatrist.    Orson Slick, MD 03/13/2016, 1:43 PM

## 2016-03-14 LAB — HEMOGLOBIN A1C
HEMOGLOBIN A1C: 5.1 % (ref 4.8–5.6)
MEAN PLASMA GLUCOSE: 100 mg/dL

## 2016-03-14 MED ORDER — TRAZODONE HCL 100 MG PO TABS: 100.0000 mg | ORAL_TABLET | Freq: Every evening | ORAL | 1 refills | Status: DC | PRN

## 2016-03-14 MED ORDER — RISPERIDONE 3 MG PO TABS: 3.0000 mg | ORAL_TABLET | Freq: Every day | ORAL | 1 refills | Status: DC

## 2016-03-14 MED ORDER — SERTRALINE HCL 100 MG PO TABS: 100.0000 mg | ORAL_TABLET | Freq: Every day | ORAL | 1 refills | Status: DC

## 2016-03-14 NOTE — Plan of Care (Signed)
Problem: Coping: Goal: Ability to interact with others will improve Outcome: Progressing Pt appears to be interacting well with peers.

## 2016-03-14 NOTE — Progress Notes (Signed)
Patient discharged home. DC instructions provided and explained. Medications reviewed. Rx given. All questions answered. Denies SI, HI, AVH. No psychosis noted. Pt stable at discharge. Belongings returned.

## 2016-03-14 NOTE — Discharge Summary (Signed)
Physician Discharge Summary Note  Patient:  Karen Beard is an 45 y.o., female MRN:  [id number] DOB:  1971/03/12 Patient phone:  [phone] (home)  Patient address:   [address] [place] [address],  Total Time spent with patient: 30 minutes  Date of Admission:  03/12/2016 Date of Discharge: 03/14/2016  Reason for Admission:  Psychotic break.  Identifying data. Karen Beard is a 45 year old female with a history of schizophrenia.  Chief complaint. "My mother was worried."  History of present illness. Information was obtained from the patient and the chart. She has a history of developmental disability and severl years ago she developed symptoms suggestive of schizophrenia. She has been lately  maintained on Zoloft by her primary provider. In the past week, she became increasingly delusional believing that a chip was implanted under her tongue that now broadcasts sounds. She is afraid that people are trying to kill her and that she is in witness protection program. She is packed and ready to go. She cropped her hair to change her appearance. The patient enies any symptoms of depression, anxiety or psychosis. She tells me that she feels better already. There is no alcohol or substances involved.   Past psychiatric history. She claims she was hospitalized once before but her mother denies it. No suicide attempts. There is a history of learning disability.  Family psychiatric history. None reported.  Social history. She lives with her mother. She has never been married, has no children. She is disabled from mental illness.  .   Principal Problem: Undifferentiated schizophrenia [facility]) Discharge Diagnoses: Patient Active Problem List   Diagnosis Date Noted  . Undifferentiated schizophrenia ([place]) [F20.3] 03/12/2016  . Migraine headache [G43.909] 03/12/2016  . Breast cancer ([place]) [C50.919] 03/15/2015  . Iron deficiency anemia [D50.9] 03/07/2015  . Family history of  breast cancer [Z80.3] 01/17/2015   Past Medical History:  Past Medical History:  Diagnosis Date  . Breast cancer ([place]) 01/2015   right breast lumpectomy with rad tx  . 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 Depression   . Edema of both legs   . GERD (gastroesophageal reflux disease)   . Hypertension    RECENTLY TAKEN OFF OF MEDS PER MOM   . Learning disability   . Migraine   . Seizures ([place])    AS A CHILD-NONE SINCE    Past Surgical History:  Procedure Laterality Date  . AXILLARY HIDRADENITIS EXCISION     [facility]  . AXILLARY SENTINEL NODE BIOPSY Right 02/23/2015   Procedure: AXILLARY SN BIOPSY;  Dr Jamal Collin, MD;  [facility] ORS;  ISOLATED TUMOR CELLS IN ONE LYMPH NODE (1/1).  Marland Kitchen BREAST BIOPSY Right 01/15/2016   stereo byrnett  . BREAST DUCTAL SYSTEM EXCISION Bilateral 01/26/2015   Procedure: BILATERAL EXCISION DUCTAL SYSTEM BREAST;  Surgeon: Christene Lye, MD;  Location: [facility] ORS;  Service: General;  Laterality: Bilateral;  . BREAST LUMPECTOMY Right 02/06/2015   Procedure: BREAST LUMPECTOMY;  Surgeon: Christene Lye, MD;  Location: [facility] ORS;  Service: General;  Laterality: Right;  . CARPAL TUNNEL RELEASE    . EXCISION / BIOPSY BREAST / NIPPLE / DUCT Bilateral 01/26/2015   left breast negative. right breast positive  . INGUINAL HIDRADENITIS EXCISION     Family History:  Family History  Problem Relation Age of Onset  . Breast cancer Sister 76  . Testicular cancer Father     Social History:  History  Alcohol Use No  History  Drug Use No    Social History   Social History  . Marital status: Single    Spouse name: N/A  . Number of children: N/A  . Years of education: N/A   Social History Main Topics  . Smoking status: Never Smoker  . Smokeless tobacco: Never Used  . Alcohol use No  . Drug use: No  . Sexual activity: Not Asked   Other Topics Concern  . None   Social History Narrative  . None    Hospital Course:     Ms. Beard is a 45 year old female with a history of schizophrenia admitted for psychotic break.  1. Psychosis. We started Risperdal.   2. Mood. We continued Zoloft at a higher dose of 100 mg.   3. Breast cancer. She is on tamoxifen.  4. Hypertension. She is on losartan.  5. Migraine headaches. Resolved.   6. Metabolic syndrome monitoring. Lipid profile shows elevated triglycerides,TSH and hemoglobin A1c are normal.   7. EKG. Normal sinus rhythm. QT 398.   8. Insomnia. Resolved with Trazodone.   9. Disposition. She was discharged to home with her mother. She will follow up with [facility].   Physical Findings: AIMS:  , ,  ,  ,    CIWA:    COWS:     Musculoskeletal: Strength & Muscle Tone: within normal limits Gait & Station: normal Patient leans: N/A  Psychiatric Specialty Exam: Physical Exam  Nursing note and vitals reviewed.   Review of Systems  All other systems reviewed and are negative.   Blood pressure 112/67, pulse 86, temperature 98 F (36.7 C), resp. rate 18, height 5\' 2"  (1.575 m), weight 71.2 kg (157 lb), SpO2 100 %.Body mass index is 28.72 kg/m.  See SRA.                                                  Sleep:  Number of Hours: 7     Have you used any form of tobacco in the last 30 days? (Cigarettes, Smokeless Tobacco, Cigars, and/or Pipes): No  Has this patient used any form of tobacco in the last 30 days? (Cigarettes, Smokeless Tobacco, Cigars, and/or Pipes) Yes, No  Blood Alcohol level:  Lab Results  Component Value Date   ETH <5 [id number]    Metabolic Disorder Labs:  Lab Results  Component Value Date   HGBA1C 5.1 03/13/2016   MPG 100 03/13/2016   No results found for: PROLACTIN Lab Results  Component Value Date   CHOL 202 (H) 03/13/2016   TRIG 279 (H) 03/13/2016   HDL 37 (L) 03/13/2016   CHOLHDL 5.5 03/13/2016   VLDL 56 (H) 03/13/2016   LDLCALC 109 (H) 03/13/2016    See Psychiatric Specialty Exam  and Suicide Risk Assessment completed by Attending Physician prior to discharge.  Discharge destination:  Home  Is patient on multiple antipsychotic therapies at discharge:  No   Has Patient had three or more failed trials of antipsychotic monotherapy by history:  No  Recommended Plan for Multiple Antipsychotic Therapies: NA  Discharge Instructions    Diet - low sodium heart healthy    Complete by:  As directed    Increase activity slowly    Complete by:  As directed        Medication List    TAKE these medications  Indication  acetaminophen 500 MG tablet Commonly known as:  TYLENOL Take 500 mg by mouth as needed. Reported on 07/27/2015  Indication:  Pain   aspirin-acetaminophen-caffeine 250-250-65 MG tablet Commonly known as:  EXCEDRIN MIGRAINE Take 1 tablet by mouth every 6 (six) hours as needed for headache. Reported on 07/27/2015  Indication:  Headache   cetirizine 10 MG tablet Commonly known as:  ZYRTEC Take 10 mg by mouth daily as needed for allergies.  Indication:  Hayfever   cyanocobalamin 1000 MCG/ML injection Commonly known as:  (VITAMIN B-12) Inject into the muscle.  Indication:  Inadequate Vitamin B12   losartan-hydrochlorothiazide 50-12.5 MG tablet Commonly known as:  HYZAAR Take by mouth.  Indication:  High Blood Pressure Disorder   polyethylene glycol packet Commonly known as:  MIRALAX Take 17 g by mouth daily.  Indication:  Constipation   risperiDONE 3 MG tablet Commonly known as:  RISPERDAL Take 1 tablet (3 mg total) by mouth at bedtime.  Indication:  Psychosis   sertraline 100 MG tablet Commonly known as:  ZOLOFT Take 1 tablet (100 mg total) by mouth daily. Start taking on:  03/15/2016 What changed:  medication strength  how much to take  how to take this  when to take this  Indication:  Major Depressive Disorder   tamoxifen 20 MG tablet Commonly known as:  NOLVADEX Take 1 tablet (20 mg total) by mouth daily.  Indication:   Cancer of the Breast   traZODone 100 MG tablet Commonly known as:  DESYREL Take 1 tablet (100 mg total) by mouth at bedtime as needed for sleep.  Indication:  [place] .   Why:  Please arrive to the walk-in clinic on Monday October 2nd at 7:45am to meet with Sherrian Divers for an assessment for medication management and therapy.  Please call Sherrian Divers at [phone] for questions and assistance. Contact information: [address] [place] [address] [phone]           Follow-up recommendations:  Activity:  as tolerated. Diet:  low sodium heart healthy. Other:  keep follow up appointments.  Comments:    Signed: Orson Slick, MD 03/14/2016, 10:17 AM

## 2016-03-14 NOTE — BHH Suicide Risk Assessment (Signed)
[facility] Discharge Suicide Risk Assessment   Principal Problem: Undifferentiated schizophrenia [facility]) Discharge Diagnoses:  Patient Active Problem List   Diagnosis Date Noted  . Undifferentiated schizophrenia ([place]) [F20.3] 03/12/2016  . Migraine headache [G43.909] 03/12/2016  . Breast cancer ([place]) [C50.919] 03/15/2015  . Iron deficiency anemia [D50.9] 03/07/2015  . Family history of breast cancer [Z80.3] 01/17/2015    Total Time spent with patient: 30 minutes  Musculoskeletal: Strength & Muscle Tone: within normal limits Gait & Station: normal Patient leans: N/A  Psychiatric Specialty Exam: Review of Systems  All other systems reviewed and are negative.   Blood pressure 112/67, pulse 86, temperature 98 F (36.7 C), resp. rate 18, height 5\' 2"  (1.575 m), weight 71.2 kg (157 lb), SpO2 100 %.Body mass index is 28.72 kg/m.  General Appearance: Casual  Eye Contact::  Good  Speech:  Clear and Coherent409  Volume:  Normal  Mood:  Anxious  Affect:  Blunt  Thought Process:  Goal Directed and Descriptions of Associations: Intact  Orientation:  Full (Time, Place, and Person)  Thought Content:  WDL  Suicidal Thoughts:  No  Homicidal Thoughts:  No  Memory:  Immediate;   Fair Recent;   Fair Remote;   Fair  Judgement:  Impaired  Insight:  Shallow  Psychomotor Activity:  Normal  Concentration:  Fair  Recall:  [place]  Language: Fair  Akathisia:  No  Handed:  Right  AIMS (if indicated):     Assets:  Communication Skills Desire for Improvement Financial Resources/Insurance Housing Resilience Social Support  Sleep:  Number of Hours: 7  Cognition: WNL  ADL's:  Intact   Mental Status Per Nursing Assessment::   On Admission:     Demographic Factors:  Caucasian  Loss Factors: Decline in physical health  Historical Factors: Impulsivity  Risk Reduction Factors:   Sense of responsibility to family, Living with another person, especially a relative,  Positive social support and Positive therapeutic relationship  Continued Clinical Symptoms:  Schizophrenia:   Depressive state Paranoid or undifferentiated type  Cognitive Features That Contribute To Risk:  None    Suicide Risk:  Minimal: No identifiable suicidal ideation.  Patients presenting with no risk factors but with morbid ruminations; may be classified as minimal risk based on the severity of the depressive symptoms  Follow-up [place] .   Why:  Please arrive to the walk-in clinic on Monday October 2nd at 7:45am to meet with Sherrian Divers for an assessment for medication management and therapy.  Please call Sherrian Divers at [phone] for questions and assistance. Contact information: [address] [place] [address] [phone]           Plan Of Care/Follow-up recommendations:  Activity:  as tolerated. Diet:  low sodium heart healthy. Other:  keep follow up appointments.  Orson Slick, MD 03/14/2016, 10:14 AM

## 2016-03-14 NOTE — BHH Group Notes (Signed)
Goals Group  Date/Time: 9:00AM Type of Therapy and Topic: Group Therapy: Goals Group: SMART Goals  ?  Participation Level: Moderate  ?  Description of Group:  ?  The purpose of a daily goals group is to assist and guide patients in setting recovery/wellness-related goals. The objective is to set goals as they relate to the crisis in which they were admitted. Patients will be using SMART goal modalities to set measurable goals. Characteristics of realistic goals will be discussed and patients will be assisted in setting and processing how one will reach their goal. Facilitator will also assist patients in applying interventions and coping skills learned in psycho-education groups to the SMART goal and process how one will achieve defined goal.  ?  Therapeutic Goals:  ?  -Patients will develop and document one goal related to or their crisis in which brought them into treatment.  -Patients will be guided by LCSW using SMART goal setting modality in how to set a measurable, attainable, realistic and time sensitive goal.  -Patients will process barriers in reaching goal.  -Patients will process interventions in how to overcome and successful in reaching goal.  ?  Patient's Goal: Pt invited but did not attend. ?  Therapeutic Modalities:  Motivational Interviewing  Cognitive Behavioral Therapy  Crisis Intervention Model  SMART goals setting   Glorious Peach, MSW, LCSW-A 03/14/2016, 10:27AM

## 2016-03-14 NOTE — Tx Team (Signed)
Interdisciplinary Treatment and Diagnostic Plan Update  03/14/2016 Time of Session: 10:52 am NICHOLINA WINSCHEL MRN: [id number]  Principal Diagnosis: Undifferentiated schizophrenia [facility])  Secondary Diagnoses: Principal Problem:   Undifferentiated schizophrenia ([place]) Active Problems:   Breast cancer ([place])   Migraine headache   Current Medications:  Current Facility-Administered Medications  Medication Dose Route Frequency Provider Last Rate Last Dose  . acetaminophen (TYLENOL) tablet 650 mg  650 mg Oral Q6H PRN Gonzella Lex, MD   650 mg at 03/12/16 2117  . alum & mag hydroxide-simeth (MAALOX/MYLANTA) 200-200-20 MG/5ML suspension 30 mL  30 mL Oral Q4H PRN Gonzella Lex, MD      . hydrOXYzine (ATARAX/VISTARIL) tablet 25 mg  25 mg Oral TID PRN Gonzella Lex, MD      . losartan (COZAAR) tablet 25 mg  25 mg Oral Daily Clovis Fredrickson, MD   25 mg at 03/14/16 0828  . magnesium hydroxide (MILK OF MAGNESIA) suspension 30 mL  30 mL Oral Daily PRN Gonzella Lex, MD      . risperiDONE (RISPERDAL) tablet 3 mg  3 mg Oral QHS Clovis Fredrickson, MD   3 mg at 03/13/16 2122  . sertraline (ZOLOFT) tablet 100 mg  100 mg Oral Daily Clovis Fredrickson, MD   100 mg at 03/14/16 0828  . tamoxifen (NOLVADEX) tablet 20 mg  20 mg Oral Daily Clovis Fredrickson, MD   20 mg at 03/14/16 0828  . traZODone (DESYREL) tablet 100 mg  100 mg Oral QHS PRN Gonzella Lex, MD       PTA Medications: Prescriptions Prior to Admission  Medication Sig Dispense Refill Last Dose  . acetaminophen (TYLENOL) 500 MG tablet Take 500 mg by mouth as needed. Reported on 07/27/2015   Taking  . aspirin-acetaminophen-caffeine (EXCEDRIN MIGRAINE) 250-250-65 MG per tablet Take 1 tablet by mouth every 6 (six) hours as needed for headache. Reported on 07/27/2015   Taking  . cetirizine (ZYRTEC) 10 MG tablet Take 10 mg by mouth daily as needed for allergies.   Taking  . cyanocobalamin (,VITAMIN B-12,) 1000 MCG/ML injection Inject into  the muscle.   Taking  . losartan-hydrochlorothiazide (HYZAAR) 50-12.5 MG tablet Take by mouth.   Taking  . polyethylene glycol (MIRALAX) packet Take 17 g by mouth daily. 14 each 0 Taking  . sertraline (ZOLOFT) 50 MG tablet    Taking  . tamoxifen (NOLVADEX) 20 MG tablet Take 1 tablet (20 mg total) by mouth daily. 30 tablet 11 Taking    Patient Stressors: Loss of father Marital or family conflict  Patient Strengths: Motivation for treatment/growth Supportive family/friends  Treatment Modalities: Medication Management, Group therapy, Case management,  1 to 1 session with clinician, Psychoeducation, Recreational therapy.   Physician Treatment Plan for Primary Diagnosis: Undifferentiated schizophrenia ([place]) Long Term Goal(s): Improvement in symptoms so as ready for discharge NA   Short Term Goals: Ability to identify changes in lifestyle to reduce recurrence of condition will improve Ability to verbalize feelings will improve Ability to disclose and discuss suicidal ideas Ability to demonstrate self-control will improve Ability to identify and develop effective coping behaviors will improve Ability to maintain clinical measurements within normal limits will improve NA  Medication Management: Evaluate patient's response, side effects, and tolerance of medication regimen.  Therapeutic Interventions: 1 to 1 sessions, Unit Group sessions and Medication administration.  Evaluation of Outcomes: Progressing  Physician Treatment Plan for Secondary Diagnosis: Principal Problem:   Undifferentiated schizophrenia ([place]) Active Problems:   Breast  cancer ([place])   Migraine headache  Long Term Goal(s): Improvement in symptoms so as ready for discharge NA   Short Term Goals: Ability to identify changes in lifestyle to reduce recurrence of condition will improve Ability to verbalize feelings will improve Ability to disclose and discuss suicidal ideas Ability to demonstrate self-control will  improve Ability to identify and develop effective coping behaviors will improve Ability to maintain clinical measurements within normal limits will improve NA     Medication Management: Evaluate patient's response, side effects, and tolerance of medication regimen.  Therapeutic Interventions: 1 to 1 sessions, Unit Group sessions and Medication administration.  Evaluation of Outcomes: Progressing   RN Treatment Plan for Primary Diagnosis: Undifferentiated schizophrenia ([place]) Long Term Goal(s): Knowledge of disease and therapeutic regimen to maintain health will improve  Short Term Goals: Ability to verbalize frustration and anger appropriately will improve, Ability to participate in decision making will improve, Ability to verbalize feelings will improve, Ability to disclose and discuss suicidal ideas and Ability to identify and develop effective coping behaviors will improve  Medication Management: RN will administer medications as ordered by provider, will assess and evaluate patient's response and provide education to patient for prescribed medication. RN will report any adverse and/or side effects to prescribing provider.  Therapeutic Interventions: 1 on 1 counseling sessions, Psychoeducation, Medication administration, Evaluate responses to treatment, Monitor vital signs and CBGs as ordered, Perform/monitor CIWA, COWS, AIMS and Fall Risk screenings as ordered, Perform wound care treatments as ordered.  Evaluation of Outcomes: Progressing   LCSW Treatment Plan for Primary Diagnosis: Undifferentiated schizophrenia ([place]) Long Term Goal(s): Safe transition to appropriate next level of care at discharge, Engage patient in therapeutic group addressing interpersonal concerns.  Short Term Goals: Engage patient in aftercare planning with referrals and resources, Increase social support, Increase ability to appropriately verbalize feelings, Increase emotional regulation, Facilitate acceptance of  mental health diagnosis and concerns and Increase skills for wellness and recovery  Therapeutic Interventions: Assess for all discharge needs, 1 to 1 time with Social worker, Explore available resources and support systems, Assess for adequacy in community support network, Educate family and significant other(s) on suicide prevention, Complete Psychosocial Assessment, Interpersonal group therapy.  Evaluation of Outcomes: Progressing   Progress in Treatment: Attending groups: No. Participating in groups: No. Taking medication as prescribed: Yes. Toleration medication: Yes. Family/Significant other contact made: Yes, individual(s) contacted:  No, CSw still assessing Patient understands diagnosis: Yes. Discussing patient identified problems/goals with staff: Yes. Medical problems stabilized or resolved: Yes. Denies suicidal/homicidal ideation: Yes. Issues/concerns per patient self-inventory: No. Other:   New problem(s) identified: No  New Short Term/Long Term Goal(s):na  Discharge Plan or Barriers: CSW still assessing for appropriate referrals   Reason for Continuation of Hospitalization: Anxiety Delusions  Depression Hallucinations Suicidal ideation  Estimated Length of Stay:  Attendees: Patient: Elida Desautels 03/14/2016 11:06 AM  Physician: Dr. Bary Leriche, MD 03/14/2016 11:06 AM  Nursing: Carolynn Sayers, RN 03/14/2016 11:06 AM  RN Care Manager: 03/14/2016 11:06 AM  Social Worker: Glorious Peach, MSW, LCSW-A 03/14/2016 11:06 AM  Recreational Therapist: Everitt Amber, LRT 03/14/2016 11:06 AM  Social Worker:  03/14/2016 11:06 AM  Social Worker:  03/14/2016 11:06 AM  Other: 03/14/2016 11:06 AM    Scribe for Treatment Team: Emilie Rutter, [place] 03/14/2016 11:06 AM

## 2016-03-14 NOTE — Progress Notes (Signed)
D: Observed pt in dayroom interacting with peers. Patient alert and oriented x4. Patient denies SI/HI/AVH. Pt affect is flat. Pt talked about having a migraine yesterday, and indicated that was the cause of her vomiting. Pt said she felt "better" tonight. Pt stated she had a "great day...went to all the groups." Pt indicated that her day was better today because "I haven't cried today." Pt can be childlike with concrete thinking. Pt denied feeling depressed or anxious. When asked about reason for admission pt stated "I was confused." Pt indicated she no longer feels confused.  A: Offered active listening and support. Provided therapeutic communication. Administered scheduled medications. Encouraged pt to continue attending groups and actively participating in care.  R: Pt pleasant and cooperative. Pt medication compliant. Will continue Q15 min. checks. Safety maintained.

## 2016-03-15 NOTE — Progress Notes (Signed)
  [facility] Adult Case Management Discharge Plan :  Will you be returning to the same living situation after discharge:  Yes, home with mother At discharge, do you have transportation home?: Yes,  home with mother Do you have the ability to pay for your medications: Yes,  Medicaid  Release of information consent forms completed and in the chart;  Patient's signature needed at discharge.  Patient to Follow up at: [place] .   Why:  Please arrive to the walk-in clinic on Monday October 2nd at 7:45am to meet with Sherrian Divers for an assessment for medication management and therapy.  Please call Sherrian Divers at [phone] for questions and assistance. Contact information: [address] [place] [address] [phone]           Next level of care provider has access to [place] and Suicide Prevention discussed: Yes,  SPE with mother and patient  Have you used any form of tobacco in the last 30 days? (Cigarettes, Smokeless Tobacco, Cigars, and/or Pipes): No  Has patient been referred to the Quitline?: N/A patient is not a smoker  Patient has been referred for addiction treatment: N/A  Emilie Rutter, MSW, LCSW-A 03/15/2016, 10:52 AM

## 2016-04-10 NOTE — Progress Notes (Signed)
  Oncology Nurse Navigator Documentation  Navigator Location: [facility] Onc (04/10/16 0900)   )Navigator Encounter Type: Telephone (04/10/16 0900) Telephone: Lahoma Crocker Call;Incoming Call;Appt Confirmation/Clarification (04/10/16 0900)                       Barriers/Navigation Needs: Coordination of Care (04/10/16 0900)                          Time Spent with Patient: 60 (04/10/16 0900)\ Patient's mother reports patient is having white nipple discharge from right nipple.  Began last week as spontaneous, but patient is able to manipulate discharge.  Discussed with Dr. Grayland Ormond.  Scheduled patient to see Dr. Jamal Collin 04/22/16 at 11:00.  Patient/mother notified of appointment.

## 2016-04-22 ENCOUNTER — Ambulatory Visit (INDEPENDENT_AMBULATORY_CARE_PROVIDER_SITE_OTHER): Payer: Medicare Other | Admitting: General Surgery

## 2016-04-22 ENCOUNTER — Inpatient Hospital Stay: Payer: Self-pay

## 2016-04-22 ENCOUNTER — Encounter: Payer: Self-pay | Admitting: General Surgery

## 2016-04-22 VITALS — BP 124/70 | HR 74 | Resp 12 | Ht 62.0 in | Wt 172.0 lb

## 2016-04-22 DIAGNOSIS — C50911 Malignant neoplasm of unspecified site of right female breast: Secondary | ICD-10-CM

## 2016-04-22 DIAGNOSIS — N6452 Nipple discharge: Secondary | ICD-10-CM

## 2016-04-22 NOTE — Patient Instructions (Signed)
Return as scheduled 

## 2016-04-22 NOTE — Progress Notes (Signed)
Patient ID: Karen Beard, female   DOB: 1971-01-08, 45 y.o.   MRN: [id number]  Chief Complaint  Patient presents with  . Other    breast discharge    HPI Karen Beard is a 45 y.o. female here today for an evaluation of left breast discharge. Patient states she noticed this about a week ago.  She states the first time she noticed this, the discharge was white and now it is a "bloody red" color.  History of right breast cancer, s/p lumpectomy and radiation therapy.  I have reviewed the history of present illness with the patient.  HPI  Past Medical History:  Diagnosis Date  . Breast cancer ([place]) 01/2015   right breast lumpectomy with rad tx  . 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 Depression   . Edema of both legs   . GERD (gastroesophageal reflux disease)   . Hypertension    RECENTLY TAKEN OFF OF MEDS PER MOM   . Learning disability   . Migraine   . 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 AXILLARY HIDRADENITIS EXCISION     [facility]  . AXILLARY SENTINEL NODE BIOPSY Right 02/23/2015   Procedure: AXILLARY SN BIOPSY;  Dr Jamal Collin, MD;  [facility] ORS;  ISOLATED TUMOR CELLS IN ONE LYMPH NODE (1/1).  Marland Kitchen BREAST BIOPSY Right 01/15/2016   stereo byrnett  . BREAST DUCTAL SYSTEM EXCISION Bilateral 01/26/2015   Procedure: BILATERAL EXCISION DUCTAL SYSTEM BREAST;  Surgeon: Christene Lye, MD;  Location: [facility] ORS;  Service: General;  Laterality: Bilateral;  . BREAST LUMPECTOMY Right 02/06/2015   Procedure: BREAST LUMPECTOMY;  Surgeon: Christene Lye, MD;  Location: [facility] ORS;  Service: General;  Laterality: Right;  . CARPAL TUNNEL RELEASE    . EXCISION / BIOPSY BREAST / NIPPLE / DUCT Bilateral 01/26/2015   left breast negative. right breast positive  . INGUINAL HIDRADENITIS EXCISION      Family History  Problem Relation Age of Onset  . Breast cancer Sister 21  . Testicular cancer  Father     Social History Social History  Substance Use Topics  . Smoking status: Never Smoker  . Smokeless tobacco: Never Used  . Alcohol use No    Allergies  Allergen Reactions  . Cephalexin Nausea And Vomiting  . Chocolate Swelling  . Corn Oil Other (See Comments)  . Peanut Oil Other (See Comments)  . Phenobarbital Other (See Comments)  . Sumatriptan Other (See Comments)  . Vilazodone Hcl Nausea Only  . Duloxetine Hcl Anxiety    agitation  . Latex Rash  . Tape Rash    Current Outpatient Prescriptions  Medication Sig Dispense Refill  . aspirin-acetaminophen-caffeine (EXCEDRIN MIGRAINE) 250-250-65 MG per tablet Take 1 tablet by mouth every 6 (six) hours as needed for headache. Reported on 07/27/2015    . cetirizine (ZYRTEC) 10 MG tablet Take 10 mg by mouth daily as needed for allergies.    Marland Kitchen losartan-hydrochlorothiazide (HYZAAR) 50-12.5 MG tablet Take by mouth.    . polyethylene glycol (MIRALAX) packet Take 17 g by mouth daily. 14 each 0  . risperiDONE (RISPERDAL) 3 MG tablet Take 1 tablet (3 mg total) by mouth at bedtime. 30 tablet 1  . sertraline (ZOLOFT) 100 MG tablet Take 1 tablet (100 mg total) by mouth daily. 30 tablet 1  . tamoxifen (NOLVADEX) 20 MG tablet Take 1 tablet (20  mg total) by mouth daily. 30 tablet 11  . traZODone (DESYREL) 100 MG tablet Take 1 tablet (100 mg total) by mouth at bedtime as needed for sleep. 30 tablet 1   No current facility-administered medications for this visit.     Review of Systems Review of Systems  Blood pressure 124/70, pulse 74, resp. rate 12, height 5\' 2"  (1.575 m), weight 172 lb (78 kg).  Physical Exam Physical Exam  Constitutional: She is oriented to person, place, and time. She appears well-developed and well-nourished.  Eyes: Conjunctivae are normal. No scleral icterus.  Neck: Neck supple.  Pulmonary/Chest: Right breast exhibits skin change. Right breast exhibits no mass and no tenderness. Left breast exhibits nipple  discharge. Left breast exhibits no inverted nipple, no mass, no skin change and no tenderness.    Lymphadenopathy:    She has no cervical adenopathy.    She has no axillary adenopathy.  Neurological: She is alert and oriented to person, place, and time.  Skin: Skin is warm and dry.  Targeted US of left breast lower areolar region performed  Data Reviewed Notes  Assessment    Nipple discharge. Most likely benign.  Ultrasound of the lower half of areola on left showed no abnormal findings    Plan    Return as scheduled- in 2 mos with mammogram. If new or worsening symptoms occur, patient is to notify office.   This information has been scribed by Verlene Mayer, CMA          SANKAR,SEEPLAPUTHUR G 04/22/2016, 2:00 PM

## 2016-05-02 ENCOUNTER — Other Ambulatory Visit: Payer: Self-pay

## 2016-05-02 DIAGNOSIS — C50911 Malignant neoplasm of unspecified site of right female breast: Secondary | ICD-10-CM

## 2016-05-03 ENCOUNTER — Encounter: Payer: Self-pay | Admitting: *Deleted

## 2016-05-07 DIAGNOSIS — G5603 Carpal tunnel syndrome, bilateral upper limbs: Secondary | ICD-10-CM | POA: Insufficient documentation

## 2016-05-08 ENCOUNTER — Ambulatory Visit: Payer: Medicare Other | Admitting: Anesthesiology

## 2016-05-08 ENCOUNTER — Encounter
Admission: RE | Disposition: A | Discharge status: Home / Self Care | Payer: Self-pay | Source: Ambulatory Visit | Attending: Surgery

## 2016-05-08 ENCOUNTER — Ambulatory Visit
Admission: RE | Admit: 2016-05-08 | Discharge: 2016-05-08 | Disposition: A | Discharge status: Home / Self Care | Payer: Medicare Other | Source: Ambulatory Visit | Attending: Surgery | Admitting: Surgery

## 2016-05-08 DIAGNOSIS — L732 Hidradenitis suppurativa: Secondary | ICD-10-CM | POA: Diagnosis not present

## 2016-05-08 DIAGNOSIS — G43909 Migraine, unspecified, not intractable, without status migrainosus: Secondary | ICD-10-CM | POA: Insufficient documentation

## 2016-05-08 DIAGNOSIS — Z882 Allergy status to sulfonamides status: Secondary | ICD-10-CM | POA: Insufficient documentation

## 2016-05-08 DIAGNOSIS — F329 Major depressive disorder, single episode, unspecified: Secondary | ICD-10-CM | POA: Diagnosis not present

## 2016-05-08 DIAGNOSIS — K219 Gastro-esophageal reflux disease without esophagitis: Secondary | ICD-10-CM | POA: Insufficient documentation

## 2016-05-08 DIAGNOSIS — Z8249 Family history of ischemic heart disease and other diseases of the circulatory system: Secondary | ICD-10-CM | POA: Diagnosis not present

## 2016-05-08 DIAGNOSIS — Z9102 Food additives allergy status: Secondary | ICD-10-CM | POA: Insufficient documentation

## 2016-05-08 DIAGNOSIS — Z8043 Family history of malignant neoplasm of testis: Secondary | ICD-10-CM | POA: Insufficient documentation

## 2016-05-08 DIAGNOSIS — Z853 Personal history of malignant neoplasm of breast: Secondary | ICD-10-CM | POA: Diagnosis not present

## 2016-05-08 DIAGNOSIS — Z79899 Other long term (current) drug therapy: Secondary | ICD-10-CM | POA: Diagnosis not present

## 2016-05-08 DIAGNOSIS — I10 Essential (primary) hypertension: Secondary | ICD-10-CM | POA: Insufficient documentation

## 2016-05-08 DIAGNOSIS — Z881 Allergy status to other antibiotic agents status: Secondary | ICD-10-CM | POA: Insufficient documentation

## 2016-05-08 DIAGNOSIS — G5601 Carpal tunnel syndrome, right upper limb: Secondary | ICD-10-CM | POA: Diagnosis present

## 2016-05-08 DIAGNOSIS — Z91048 Other nonmedicinal substance allergy status: Secondary | ICD-10-CM | POA: Insufficient documentation

## 2016-05-08 DIAGNOSIS — Z91010 Allergy to peanuts: Secondary | ICD-10-CM | POA: Diagnosis not present

## 2016-05-08 DIAGNOSIS — Z91040 Latex allergy status: Secondary | ICD-10-CM | POA: Diagnosis not present

## 2016-05-08 DIAGNOSIS — Z888 Allergy status to other drugs, medicaments and biological substances status: Secondary | ICD-10-CM | POA: Diagnosis not present

## 2016-05-08 HISTORY — PX: CARPAL TUNNEL RELEASE: SHX101

## 2016-05-08 SURGERY — CARPAL TUNNEL RELEASE
Anesthesia: Monitor Anesthesia Care | Laterality: Right | Wound class: Clean

## 2016-05-08 MED ORDER — ACETAMINOPHEN 325 MG PO TABS: 325.0000 mg | ORAL_TABLET | ORAL | Status: DC | PRN

## 2016-05-08 MED ORDER — ACETAMINOPHEN 160 MG/5ML PO SOLN: 325.0000 mg | ORAL | Status: DC | PRN

## 2016-05-08 MED ORDER — DEXAMETHASONE SODIUM PHOSPHATE 4 MG/ML IJ SOLN
INTRAMUSCULAR | Status: DC | PRN
  Administered 2016-05-08: 4 mg via INTRAVENOUS

## 2016-05-08 MED ORDER — BUPIVACAINE HCL (PF) 0.5 % IJ SOLN
INTRAMUSCULAR | Status: DC | PRN
  Administered 2016-05-08: 10 mL

## 2016-05-08 MED ORDER — HYDROCODONE-ACETAMINOPHEN 5-325 MG PO TABS: 1.0000 | ORAL_TABLET | Freq: Four times a day (QID) | ORAL | 0 refills | Status: DC | PRN

## 2016-05-08 MED ORDER — ONDANSETRON HCL 4 MG PO TABS: 4.0000 mg | ORAL_TABLET | Freq: Four times a day (QID) | ORAL | Status: DC | PRN

## 2016-05-08 MED ORDER — HYDROCODONE-ACETAMINOPHEN 5-325 MG PO TABS: 1.0000 | ORAL_TABLET | ORAL | Status: DC | PRN

## 2016-05-08 MED ORDER — OXYCODONE HCL 5 MG/5ML PO SOLN: 5.0000 mg | Freq: Once | ORAL | Status: DC | PRN

## 2016-05-08 MED ORDER — PROPOFOL 10 MG/ML IV BOLUS
INTRAVENOUS | Status: DC | PRN
  Administered 2016-05-08: 120 mg via INTRAVENOUS
  Administered 2016-05-08: 30 mg via INTRAVENOUS

## 2016-05-08 MED ORDER — METOCLOPRAMIDE HCL 5 MG PO TABS: 5.0000 mg | ORAL_TABLET | Freq: Three times a day (TID) | ORAL | Status: DC | PRN

## 2016-05-08 MED ORDER — LIDOCAINE HCL (CARDIAC) 20 MG/ML IV SOLN
INTRAVENOUS | Status: DC | PRN
  Administered 2016-05-08: 40 mg via INTRATRACHEAL

## 2016-05-08 MED ORDER — FENTANYL CITRATE (PF) 100 MCG/2ML IJ SOLN: 25.0000 ug | INTRAMUSCULAR | Status: DC | PRN

## 2016-05-08 MED ORDER — CEFAZOLIN SODIUM 1 G IJ SOLR
2000.0000 mg | Freq: Once | INTRAMUSCULAR | Status: AC
  Administered 2016-05-08: 2000 mg via INTRAVENOUS

## 2016-05-08 MED ORDER — ONDANSETRON HCL 4 MG/2ML IJ SOLN: 4.0000 mg | Freq: Once | INTRAMUSCULAR | Status: DC | PRN

## 2016-05-08 MED ORDER — FENTANYL CITRATE (PF) 100 MCG/2ML IJ SOLN
INTRAMUSCULAR | Status: DC | PRN
  Administered 2016-05-08 (×2): 50 ug via INTRAVENOUS

## 2016-05-08 MED ORDER — METOCLOPRAMIDE HCL 5 MG/ML IJ SOLN: 5.0000 mg | Freq: Three times a day (TID) | INTRAMUSCULAR | Status: DC | PRN

## 2016-05-08 MED ORDER — LACTATED RINGERS IV SOLN
INTRAVENOUS | Status: DC
  Administered 2016-05-08: 13:00:00 via INTRAVENOUS

## 2016-05-08 MED ORDER — GLYCOPYRROLATE 0.2 MG/ML IJ SOLN
INTRAMUSCULAR | Status: DC | PRN
  Administered 2016-05-08: 0.1 mg via INTRAVENOUS

## 2016-05-08 MED ORDER — POTASSIUM CHLORIDE IN NACL 20-0.9 MEQ/L-% IV SOLN: INTRAVENOUS | Status: DC

## 2016-05-08 MED ORDER — MIDAZOLAM HCL 5 MG/5ML IJ SOLN
INTRAMUSCULAR | Status: DC | PRN
  Administered 2016-05-08: 2 mg via INTRAVENOUS

## 2016-05-08 MED ORDER — ONDANSETRON HCL 4 MG/2ML IJ SOLN: 4.0000 mg | Freq: Four times a day (QID) | INTRAMUSCULAR | Status: DC | PRN

## 2016-05-08 MED ORDER — SCOPOLAMINE 1 MG/3DAYS TD PT72
1.0000 | MEDICATED_PATCH | Freq: Once | TRANSDERMAL | Status: DC
  Administered 2016-05-08: 1.5 mg via TRANSDERMAL

## 2016-05-08 MED ORDER — OXYCODONE HCL 5 MG PO TABS: 5.0000 mg | ORAL_TABLET | Freq: Once | ORAL | Status: DC | PRN

## 2016-05-08 SURGICAL SUPPLY — 29 items
BANDAGE ELASTIC 2 LF NS (GAUZE/BANDAGES/DRESSINGS) ×2 IMPLANT
BNDG CMPR MED 5X2 ELC HKLP NS (GAUZE/BANDAGES/DRESSINGS) ×1
BNDG COHESIVE 4X5 TAN STRL (GAUZE/BANDAGES/DRESSINGS) ×2 IMPLANT
BNDG ESMARK 4X12 TAN STRL LF (GAUZE/BANDAGES/DRESSINGS) ×2 IMPLANT
CHLORAPREP W/TINT 26ML (MISCELLANEOUS) ×2 IMPLANT
CORD BIP STRL DISP 12FT (MISCELLANEOUS) ×2 IMPLANT
COVER LIGHT HANDLE UNIVERSAL (MISCELLANEOUS) ×4 IMPLANT
CUFF TOURN SGL QUICK 18 (TOURNIQUET CUFF) ×1 IMPLANT
CUFF TOURNIQUET DUAL PORT 18X3 (MISCELLANEOUS) ×1 IMPLANT
DRAPE SURG 17X23 STRL (DRAPES) ×2 IMPLANT
GAUZE PETRO XEROFOAM 1X8 (MISCELLANEOUS) ×2 IMPLANT
GAUZE SPONGE 4X4 12PLY STRL (GAUZE/BANDAGES/DRESSINGS) ×2 IMPLANT
GLOVE BIO SURGEON STRL SZ8 (GLOVE) ×1 IMPLANT
GLOVE INDICATOR 8.0 STRL GRN (GLOVE) ×1 IMPLANT
GOWN STRL REUS W/ TWL LRG LVL3 (GOWN DISPOSABLE) ×1 IMPLANT
GOWN STRL REUS W/ TWL XL LVL3 (GOWN DISPOSABLE) ×1 IMPLANT
GOWN STRL REUS W/TWL LRG LVL3 (GOWN DISPOSABLE) ×2
GOWN STRL REUS W/TWL XL LVL3 (GOWN DISPOSABLE) ×2
KIT CARPAL TUNNEL (MISCELLANEOUS) ×2
KIT ESCP INSRT D SLOT CANN KN (MISCELLANEOUS) ×1 IMPLANT
KIT ROOM TURNOVER OR (KITS) ×2 IMPLANT
NS IRRIG 500ML POUR BTL (IV SOLUTION) ×2 IMPLANT
PACK EXTREMITY ARMC (MISCELLANEOUS) ×2 IMPLANT
SPLINT WRIST M RT TX990303 (SOFTGOODS) ×1 IMPLANT
STOCKINETTE IMPERVIOUS 9X36 MD (GAUZE/BANDAGES/DRESSINGS) ×2 IMPLANT
STRAP BODY AND KNEE 60X3 (MISCELLANEOUS) ×2 IMPLANT
SUT PROLENE 4 0 PS 2 18 (SUTURE) ×2 IMPLANT
SUT VIC AB 3-0 SH 27 (SUTURE)
SUT VIC AB 3-0 SH 27X BRD (SUTURE) IMPLANT

## 2016-05-08 NOTE — Anesthesia Postprocedure Evaluation (Signed)
Anesthesia Post Note  Patient: Karen Beard  Procedure(s) Performed: Procedure(s) (LRB): OPEN CARPAL TUNNEL RELEASE (Right)  Patient location during evaluation: PACU Anesthesia Type: General Level of consciousness: awake and alert and oriented Pain management: satisfactory to patient Vital Signs Assessment: post-procedure vital signs reviewed and stable Respiratory status: spontaneous breathing, nonlabored ventilation and respiratory function stable Cardiovascular status: blood pressure returned to baseline and stable Postop Assessment: Adequate PO intake and No signs of nausea or vomiting Anesthetic complications: no    Raliegh Ip

## 2016-05-08 NOTE — Anesthesia Procedure Notes (Signed)
Procedure Name: LMA Insertion Date/Time: 05/08/2016 1:45 PM Performed by: Mayme Genta Pre-anesthesia Checklist: Patient identified, Emergency Drugs available, Suction available, Timeout performed and Patient being monitored Patient Re-evaluated:Patient Re-evaluated prior to inductionOxygen Delivery Method: Circle system utilized Preoxygenation: Pre-oxygenation with 100% oxygen Intubation Type: IV induction LMA: LMA inserted LMA Size: 4.0 Number of attempts: 1 Placement Confirmation: positive ETCO2 and breath sounds checked- equal and bilateral Tube secured with: Tape

## 2016-05-08 NOTE — Transfer of Care (Signed)
Immediate Anesthesia Transfer of Care Note  Patient: Karen Beard  Procedure(s) Performed: Procedure(s): OPEN CARPAL TUNNEL RELEASE (Right)  Patient Location: PACU  Anesthesia Type: Bier Block, MAC  Level of Consciousness: awake, alert  and patient cooperative  Airway and Oxygen Therapy: Patient Spontanous Breathing and Patient connected to supplemental oxygen  Post-op Assessment: Post-op Vital signs reviewed, Patient's Cardiovascular Status Stable, Respiratory Function Stable, Patent Airway and No signs of Nausea or vomiting  Post-op Vital Signs: Reviewed and stable  Complications: No apparent anesthesia complications

## 2016-05-08 NOTE — Anesthesia Preprocedure Evaluation (Addendum)
Anesthesia Evaluation  Patient identified by MRN, date of birth, ID band Patient awake    Reviewed: Allergy & Precautions, H&P , NPO status , Patient's Chart, lab work & pertinent test results  Airway Mallampati: II  TM Distance: >3 FB Neck ROM: full    Dental no notable dental hx. (+) Poor Dentition   Pulmonary    Pulmonary exam normal        Cardiovascular hypertension, Normal cardiovascular exam     Neuro/Psych  Headaches, PSYCHIATRIC DISORDERS    GI/Hepatic GERD  ,  Endo/Other    Renal/GU      Musculoskeletal   Abdominal   Peds  Hematology   Anesthesia Other Findings   Reproductive/Obstetrics                             Anesthesia Physical Anesthesia Plan  ASA: II  Anesthesia Plan: General   Post-op Pain Management:    Induction:   Airway Management Planned: LMA  Additional Equipment:   Intra-op Plan:   Post-operative Plan:   Informed Consent: I have reviewed the patients History and Physical, chart, labs and discussed the procedure including the risks, benefits and alternatives for the proposed anesthesia with the patient or authorized representative who has indicated his/her understanding and acceptance.     Plan Discussed with:   Anesthesia Plan Comments:        Anesthesia Quick Evaluation

## 2016-05-08 NOTE — H&P (Signed)
Paper H&P to be scanned into permanent record. H&P reviewed. No changes. 

## 2016-05-08 NOTE — Op Note (Signed)
05/08/2016  2:30 PM  Pre-Op Diagnosis:   Recurrent right carpal tunnel syndrome.  Post-Op Diagnosis:   Same.  Procedure:   Open right carpal tunnel release.  Surgeon:   Pascal Lux, MD  Anesthesia:   General LMA  Findings:   As above.  Complications:   None  EBL:   0 cc  Fluids:   800 cc crystalloid  TT:   24 minutes at 250 mmHg  Drains:   None  Closure:   4-0 Prolene interrupted sutures  Brief Clinical Note:   The patient is a 45 year old female who had undergone a mini open right carpal tunnel release approximately 10-12 years ago. The patient had been doing well until the past year or so when she has begun to notice recurrent pain and paresthesias to the median nerve distribution of her right hand. A recent EMG/NCV study has confirmed the presence of recurrent carpal tunnel syndrome. The patient presents at this time for an open right carpal tunnel release.   Procedure:   The patient was brought into the operating room and lain in the supine position. After adequate general laryngeal mask anesthesia was obtained, the right hand and upper extremity were prepped with ChloraPrep solution before being draped sterilely. Preoperative antibiotics were administered. A timeout was performed to verify the appropriate surgical site before the limb was exsanguinated and a tourniquet inflated to 250 mmHg. An approximately 5-6 cm incision was made over the volar aspect of the palm, incorporating her prior incision, before extending this proximally into the distal forearm, angling ulnarly across the wrist flexion crease. The incision was carried down through the subcutaneous tissues with care taken to identify and protect any neurovascular structures. The distal forearm fascia was penetrated bluntly and the median nerve visualized. Careful dissection was performed to release any adhesions off the underside of the distal forearm fascia before this was released under direct visualization for 3-4  cm proximal to the transverse carpal ligament. The nerve was tracked distally, releasing the transverse carpal ligament in line with the skin incision with care taken to protect the underlying median nerve while looking for any possible aberrant branches. The underlying nerve was carefully inspected and found to be intact.  The wound was irrigated thoroughly with sterile saline solution before being closed using 4-0 Prolene interrupted sutures. A total of 10 cc of 0.5% plain Sensorcaine was injected in and around the incision before a sterile bulky dressing was applied to the wound. The patient was placed into a volar wrist splint before being awakened, extubated, and returned to the recovery room in satisfactory condition after tolerating the procedure well.

## 2016-05-08 NOTE — Discharge Instructions (Signed)
General Anesthesia, Adult, Care After These instructions provide you with information about caring for yourself after your procedure. Your health care provider may also give you more specific instructions. Your treatment has been planned according to current medical practices, but problems sometimes occur. Call your health care provider if you have any problems or questions after your procedure. What can I expect after the procedure? After the procedure, it is common to have:  Vomiting.  A sore throat.  Mental slowness. It is common to feel:  Nauseous.  Cold or shivery.  Sleepy.  Tired.  Sore or achy, even in parts of your body where you did not have surgery. Follow these instructions at home: For at least 24 hours after the procedure:  Do not:  Participate in activities where you could fall or become injured.  Drive.  Use heavy machinery.  Drink alcohol.  Take sleeping pills or medicines that cause drowsiness.  Make important decisions or sign legal documents.  Take care of children on your own.  Rest. Eating and drinking  If you vomit, drink water, juice, or soup when you can drink without vomiting.  Drink enough fluid to keep your urine clear or pale yellow.  Make sure you have little or no nausea before eating solid foods.  Follow the diet recommended by your health care provider. General instructions  Have a responsible adult stay with you until you are awake and alert.  Return to your normal activities as told by your health care provider. Ask your health care provider what activities are safe for you.  Take over-the-counter and prescription medicines only as told by your health care provider.  If you smoke, do not smoke without supervision.  Keep all follow-up visits as told by your health care provider. This is important. Contact a health care provider if:  You continue to have nausea or vomiting at home, and medicines are not helpful.  You  cannot drink fluids or start eating again.  You cannot urinate after 8-12 hours.  You develop a skin rash.  You have fever.  You have increasing redness at the site of your procedure. Get help right away if:  You have difficulty breathing.  You have chest pain.  You have unexpected bleeding.  You feel that you are having a life-threatening or urgent problem. This information is not intended to replace advice given to you by your health care provider. Make sure you discuss any questions you have with your health care provider. Document Released: 09/09/2000 Document Revised: 11/06/2015 Document Reviewed: 05/18/2015 Elsevier Interactive Patient Education  2017 [place] dressing dry and intact. Keep hand elevated above heart level. May shower after dressing removed on postop day 4 (7Sunday), then reapply splint. Cover sutures with Band-Aids or ace wrap after drying off. Apply ice to affected area frequently. Take Aleve 2 tabs twice daily with meals for 7-10 days, then as necessary. Take pain medication as prescribed when needed.  Return for follow-up in 10-14 days or as scheduled.

## 2016-05-28 NOTE — Progress Notes (Signed)
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  INTERVAL HISTORY: Patient returns to clinic today for repeat laboratory work and further evaluation. She is tolerating tamoxifen well without significant side effects. She recently saw Dr. Jamal Collin for milky white discharge of the right nipple that began in late October intermittently. Dr. Jamal Collin ordered an ultrasound (results unavailable at this time) but symptoms have resolved. She recently had carpal tunnel surgery of her right hand one month ago and is doing well. She continues to be anxious. She has no neurologic complaints. She denies any recent fevers. She has a good appetite and denies weight loss. She denies any chest pain or shortness of breath. She denies any nausea, vomiting, constipation, or diarrhea. Patient offers no further specific complaints.  REVIEW OF SYSTEMS:   Review of Systems  Constitutional: Negative for fever and malaise/fatigue.  Respiratory: Negative.  Negative for cough and shortness of breath.   Cardiovascular: Negative.  Negative for chest pain and leg swelling.  Gastrointestinal: Negative.  Negative for blood in stool and melena.  Genitourinary: Negative.   Musculoskeletal: Negative.   Neurological: Negative for sensory change and weakness.  Psychiatric/Behavioral: The patient is nervous/anxious.     As per HPI. Otherwise, a complete review of systems is negative.  PAST MEDICAL HISTORY: Past Medical History:  Diagnosis Date  . Breast cancer ([place]) 01/2015   right breast lumpectomy with rad tx  . 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 Depression   . Edema of both legs   . GERD (gastroesophageal reflux disease)   . Hypertension    RECENTLY TAKEN OFF OF MEDS PER MOM   . Learning disability   . Migraine    daily  . 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 AXILL[facility] HIDRADENITIS EXCISION     [facility]  . AXILLARY SENTINEL NODE BIOPSY Right 02/23/2015   Procedure: AXILLARY SN BIOPSY;  Dr Jamal Collin, MD;  [facility] ORS;  ISOLATED TUMOR CELLS IN ONE LYMPH NODE (1/1).  Marland Kitchen BREAST BIOPSY Right 01/15/2016   stereo byrnett  . BREAST DUCTAL SYSTEM EXCISION Bilateral 01/26/2015   Procedure: BILATERAL EXCISION DUCTAL SYSTEM BREAST;  Surgeon: Christene Lye, MD;  Location: [facility] ORS;  Service: General;  Laterality: Bilateral;  . BREAST LUMPECTOMY Right 02/06/2015   Procedure: BREAST LUMPECTOMY;  Surgeon: Christene Lye, MD;  Location: [facility] ORS;  Service: General;  Laterality: Right;  . CARPAL TUNNEL RELEASE    . CARPAL TUNNEL RELEASE Right 05/08/2016   Procedure: OPEN CARPAL TUNNEL RELEASE;  Surgeon: Corky Mull, MD;  Location: [place];  Service: Orthopedics;  Laterality: Right;  . EXCISION / BIOPSY BREAST / NIPPLE / DUCT Bilateral 01/26/2015   left breast negative. right breast positive  . INGUINAL HIDRADENITIS EXCISION      FAMILY HISTORY Family History  Problem Relation Age of Onset  . Breast cancer Sister 9  . Testicular cancer Father        ADVANCED DIRECTIVES:    HEALTH MAINTENANCE: Social History  Substance Use Topics  .  Smoking status: Never Smoker  . Smokeless tobacco: Never Used  . Alcohol use No     Colonoscopy:  PAP:  Bone density:  Lipid panel:  Allergies  Allergen Reactions  . Sumatriptan Shortness Of Breath  . Cephalexin Nausea And Vomiting  . Chocolate Swelling  . Corn Oil Other (See Comments)    By test. Pt has eaten without  issues.  . Peanut Oil Swelling    throat  . Phenobarbital Other (See Comments)    Hyperactivity, aggitation  . Vilazodone Hcl Nausea Only  . Duloxetine Hcl Anxiety    agitation  . Latex Rash    Bandaids, gloves(when worn)  . Sulfa Antibiotics Rash  . Tape Rash    Current Outpatient Prescriptions  Medication Sig Dispense Refill  . ARIPiprazole (ABILIFY) 10 MG tablet Take 10 mg by mouth daily.    Marland Kitchen aspirin-acetaminophen-caffeine (EXCEDRIN MIGRAINE) 250-250-65 MG per tablet Take 1 tablet by mouth every 6 (six) hours as needed for headache. Reported on 07/27/2015    . cetirizine (ZYRTEC) 10 MG tablet Take 10 mg by mouth daily as needed for allergies.    Marland Kitchen losartan-hydrochlorothiazide (HYZAAR) 50-12.5 MG tablet Take by mouth.    . polyethylene glycol (MIRALAX) packet Take 17 g by mouth daily. 14 each 0  . sertraline (ZOLOFT) 100 MG tablet Take 1 tablet (100 mg total) by mouth daily. 30 tablet 1  . tamoxifen (NOLVADEX) 20 MG tablet Take 1 tablet (20 mg total) by mouth daily. 30 tablet 11   No current facility-administered medications for this visit.     OBJECTIVE: Vitals:   05/29/16 0847  BP: 124/87  Pulse: 75  Resp: 18  Temp: 99.6 F (37.6 C)     Body mass index is 31.39 kg/m.    ECOG FS:0 - Asymptomatic  General: Well-developed, well-nourished, no acute distress. Eyes: Pink conjunctiva, anicteric sclera. Breasts: Patient request exam be deferred today. Lungs: Clear to auscultation bilaterally. Heart: Regular rate and rhythm. No rubs, murmurs, or gallops. Abdomen: Soft, nontender, nondistended. No organomegaly noted, normoactive bowel sounds. Musculoskeletal: No edema, cyanosis, or clubbing. Neuro: Alert, answering all questions appropriately. Cranial nerves grossly intact. Skin: No rashes or petechiae noted. Psych: Normal affect.   LAB RESULTS:  Lab Results  Component Value Date   NA 137 03/11/2016   K 3.4 (L) 03/11/2016   CL 104 03/11/2016   CO2 25 03/11/2016    GLUCOSE 124 (H) 03/11/2016   BUN 12 03/11/2016   CREATININE 0.62 03/11/2016   CALCIUM 8.8 (L) 03/11/2016   PROT 7.0 03/11/2016   ALBUMIN 3.8 03/11/2016   AST 21 03/11/2016   ALT 13 (L) 03/11/2016   ALKPHOS 41 03/11/2016   BILITOT 0.8 03/11/2016   GFRNONAA >60 03/11/2016   GFRAA >60 03/11/2016    Lab Results  Component Value Date   WBC 4.6 05/29/2016   NEUTROABS 2.8 05/29/2016   HGB 13.1 05/29/2016   HCT 38.1 05/29/2016   MCV 81.2 05/29/2016   PLT 240 05/29/2016   Lab Results  Component Value Date   IRON 39 05/29/2016   TIBC 319 05/29/2016   IRONPCTSAT 12 05/29/2016    Lab Results  Component Value Date   FERRITIN 18 05/29/2016     STUDIES: No results found.  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 Low risk Oncotype DX.  PLAN:    1. 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 Patient's most recent mammogram on July 19, 2015 was reported as BI-RADS 2. Repeat in February 2018. Continue tamoxifen daily for 5 years completing in December 2021. Return to clinic in 6 months for routine evaluation. 2. Edema/anasarca: Resolved. Patient states that swelling resolved spontaneously.  3. Heavy menses: Improved. Seeing OB/GYN for this.  4. Iron deficiency anemia: Patient's hemoglobin and iron stores are within normal limits. She does not require additional IV Feraheme today. She last received iron in October 2016. Follow up in 6 months.  5. Milky discharge from right breast: Followed by Dr. Jamal Collin. Right breast ultrasound performed on April 22, 2016. No results visible at this time. Patient states that nothing was identified as causing this problem. This discharged has stopped over the past few weeks. Patient has follow-up scheduled with Dr. Jamal Collin.   Patient expressed understanding and was in  agreement with this plan. She also understands that She can call clinic at any time with any questions, concerns, or complaints.   Faythe Casa, NP 05/29/2016 09:35 AM  Patient was seen and evaluated independently and I agree with the assessment and plan as dictated above.  Lloyd Huger, MD 06/01/16 8:54 AM

## 2016-05-29 ENCOUNTER — Ambulatory Visit
Admission: RE | Admit: 2016-05-29 | Discharge: 2016-05-29 | Disposition: A | Discharge status: Home / Self Care | Payer: Medicare Other | Source: Ambulatory Visit | Attending: Radiation Oncology | Admitting: Radiation Oncology

## 2016-05-29 ENCOUNTER — Inpatient Hospital Stay (HOSPITAL_BASED_OUTPATIENT_CLINIC_OR_DEPARTMENT_OTHER): Payer: Medicare Other | Admitting: Oncology

## 2016-05-29 ENCOUNTER — Inpatient Hospital Stay: Payer: Medicare Other | Attending: Oncology

## 2016-05-29 ENCOUNTER — Inpatient Hospital Stay: Payer: Medicare Other

## 2016-05-29 VITALS — BP 124/87 | HR 75 | Temp 99.6°F | Resp 18 | Wt 171.6 lb

## 2016-05-29 DIAGNOSIS — C50111 Malignant neoplasm of central portion of right female breast: Secondary | ICD-10-CM

## 2016-05-29 DIAGNOSIS — D509 Iron deficiency anemia, unspecified: Secondary | ICD-10-CM | POA: Insufficient documentation

## 2016-05-29 DIAGNOSIS — F419 Anxiety disorder, unspecified: Secondary | ICD-10-CM | POA: Insufficient documentation

## 2016-05-29 DIAGNOSIS — Z803 Family history of malignant neoplasm of breast: Secondary | ICD-10-CM

## 2016-05-29 DIAGNOSIS — Z8669 Personal history of other diseases of the nervous system and sense organs: Secondary | ICD-10-CM | POA: Insufficient documentation

## 2016-05-29 DIAGNOSIS — Z808 Family history of malignant neoplasm of other organs or systems: Secondary | ICD-10-CM

## 2016-05-29 DIAGNOSIS — Z170 Estrogen receptor positive status [ER+]: Secondary | ICD-10-CM | POA: Insufficient documentation

## 2016-05-29 DIAGNOSIS — F329 Major depressive disorder, single episode, unspecified: Secondary | ICD-10-CM | POA: Diagnosis not present

## 2016-05-29 DIAGNOSIS — Z79810 Long term (current) use of selective estrogen receptor modulators (SERMs): Secondary | ICD-10-CM | POA: Insufficient documentation

## 2016-05-29 DIAGNOSIS — K219 Gastro-esophageal reflux disease without esophagitis: Secondary | ICD-10-CM | POA: Diagnosis not present

## 2016-05-29 DIAGNOSIS — C50911 Malignant neoplasm of unspecified site of right female breast: Secondary | ICD-10-CM | POA: Insufficient documentation

## 2016-05-29 DIAGNOSIS — R609 Edema, unspecified: Secondary | ICD-10-CM | POA: Insufficient documentation

## 2016-05-29 DIAGNOSIS — Z923 Personal history of irradiation: Secondary | ICD-10-CM | POA: Diagnosis not present

## 2016-05-29 DIAGNOSIS — F819 Developmental disorder of scholastic skills, unspecified: Secondary | ICD-10-CM

## 2016-05-29 DIAGNOSIS — I10 Essential (primary) hypertension: Secondary | ICD-10-CM

## 2016-05-29 DIAGNOSIS — G43909 Migraine, unspecified, not intractable, without status migrainosus: Secondary | ICD-10-CM

## 2016-05-29 DIAGNOSIS — Z79899 Other long term (current) drug therapy: Secondary | ICD-10-CM

## 2016-05-29 LAB — CBC WITH DIFFERENTIAL/PLATELET
BASOS ABS: 0.1 10*3/uL (ref 0–0.1)
BASOS PCT: 1 %
EOS ABS: 0.2 10*3/uL (ref 0–0.7)
Eosinophils Relative: 4 %
HCT: 38.1 % (ref 35.0–47.0)
HEMOGLOBIN: 13.1 g/dL (ref 12.0–16.0)
LYMPHS ABS: 1.2 10*3/uL (ref 1.0–3.6)
LYMPHS PCT: 27 %
MCH: 27.9 pg (ref 26.0–34.0)
MCHC: 34.4 g/dL (ref 32.0–36.0)
MCV: 81.2 fL (ref 80.0–100.0)
MONOS PCT: 7 %
Monocytes Absolute: 0.3 10*3/uL (ref 0.2–0.9)
NEUTROS ABS: 2.8 10*3/uL (ref 1.4–6.5)
Neutrophils Relative %: 61 %
Platelets: 240 10*3/uL (ref 150–440)
RBC: 4.69 MIL/uL (ref 3.80–5.20)
RDW: 13.9 % (ref 11.5–14.5)
WBC: 4.6 10*3/uL (ref 3.6–11.0)

## 2016-05-29 LAB — IRON AND TIBC
IRON: 39 ug/dL (ref 28–170)
Saturation Ratios: 12 % (ref 10.4–31.8)
TIBC: 319 ug/dL (ref 250–450)
UIBC: 280 ug/dL

## 2016-05-29 LAB — FERRITIN: FERRITIN: 18 ng/mL (ref 11–307)

## 2016-05-29 NOTE — Progress Notes (Signed)
States is feeling well. Offers no complaints. 

## 2016-05-29 NOTE — Progress Notes (Signed)
Radiation Oncology Follow up Note  Name: Karen Beard   Date:   05/29/2016 MRN:  [id number] DOB: June 02, 1971    This 45 y.o. female presents to the clinic today for 1 year follow-up status post whole breast radiation to her right breast for stage I invasive mammary carcinoma.  REFERRING PROVIDER: Maryland Pink, MD  HPI: Patient is a 45 year old female now out 1 year having completed whole breast radiation to her right breast for stage I invasive mammary carcinoma. She seen today in routine follow-up is doing well she specifically denies breast tenderness cough or bone pain. Currently on tamoxifen tolerating that well without side effect.. Follow-up mammograms have been benign.  COMPLICATIONS OF TREATMENT: none  FOLLOW UP COMPLIANCE: keeps appointments   PHYSICAL EXAM:  LMP 04/26/2016 Comment: preg test neg Patient is status post nipple sacrifice of the right breast no dominant mass or nodularity is noted in either breast in 2 positions examined. No axillary or supra clavicular adenopathy is appreciated. Well-developed well-nourished patient in NAD. HEENT reveals PERLA, EOMI, discs not visualized.  Oral cavity is clear. No oral mucosal lesions are identified. Neck is clear without evidence of cervical or supraclavicular adenopathy. Lungs are clear to A&P. Cardiac examination is essentially unremarkable with regular rate and rhythm without murmur rub or thrill. Abdomen is benign with no organomegaly or masses noted. Motor sensory and DTR levels are equal and symmetric in the upper and lower extremities. Cranial nerves II through XII are grossly intact. Proprioception is intact. No peripheral adenopathy or edema is identified. No motor or sensory levels are noted. Crude visual fields are within normal range.  RADIOLOGY RESULTS: Requested previous mammograms for my review  PLAN: Present time patient is doing well with no evidence of disease. She continues on tamoxifen without side effect. She  has follow-up mammogram scheduled in February. I'm please were overall progress. I have asked to see her back in 1 year for follow-up. Patient and family know to call sooner with any concerns.  I would like to take this opportunity to thank you for allowing me to participate in the care of your patient.Armstead Peaks., MD

## 2016-06-13 ENCOUNTER — Telehealth: Payer: Self-pay | Admitting: *Deleted

## 2016-06-13 DIAGNOSIS — Z170 Estrogen receptor positive status [ER+]: Principal | ICD-10-CM

## 2016-06-13 DIAGNOSIS — C50111 Malignant neoplasm of central portion of right female breast: Secondary | ICD-10-CM

## 2016-06-13 MED ORDER — TAMOXIFEN CITRATE 20 MG PO TABS: 20.0000 mg | ORAL_TABLET | Freq: Every day | ORAL | 11 refills | Status: DC

## 2016-06-13 NOTE — Telephone Encounter (Signed)
Refills for tamoxifen escribed to [facility].

## 2016-07-23 ENCOUNTER — Ambulatory Visit
Admission: RE | Admit: 2016-07-23 | Discharge: 2016-07-23 | Disposition: A | Discharge status: Home / Self Care | Payer: Medicare Other | Source: Ambulatory Visit | Attending: General Surgery | Admitting: General Surgery

## 2016-07-23 DIAGNOSIS — C50911 Malignant neoplasm of unspecified site of right female breast: Secondary | ICD-10-CM | POA: Insufficient documentation

## 2016-07-23 DIAGNOSIS — Z1231 Encounter for screening mammogram for malignant neoplasm of breast: Secondary | ICD-10-CM | POA: Diagnosis present

## 2016-07-23 DIAGNOSIS — Z9889 Other specified postprocedural states: Secondary | ICD-10-CM | POA: Diagnosis not present

## 2016-07-23 HISTORY — DX: Personal history of irradiation: Z92.3

## 2016-07-30 ENCOUNTER — Encounter: Payer: Self-pay | Admitting: General Surgery

## 2016-07-30 ENCOUNTER — Ambulatory Visit (INDEPENDENT_AMBULATORY_CARE_PROVIDER_SITE_OTHER): Payer: Medicare Other | Admitting: General Surgery

## 2016-07-30 VITALS — BP 124/76 | HR 74 | Resp 14 | Ht 62.0 in | Wt 183.0 lb

## 2016-07-30 DIAGNOSIS — Z170 Estrogen receptor positive status [ER+]: Secondary | ICD-10-CM | POA: Diagnosis not present

## 2016-07-30 DIAGNOSIS — C50111 Malignant neoplasm of central portion of right female breast: Secondary | ICD-10-CM | POA: Diagnosis not present

## 2016-07-30 NOTE — Progress Notes (Signed)
Patient ID: Karen Beard, female   DOB: 1970-08-06, 46 y.o.   MRN: [id number]  Chief Complaint  Patient presents with  . Follow-up    HPI Karen Beard is a 46 y.o. female.  who presents for her breast cancer follow up and evaluation. The most recent mammogram was done on 07-23-16.  Patient does perform regular self breast checks and gets regular mammograms done.  No new breast issues. Tolerating her tamoxifen. No further nipple discharge. She is here today with her mother. I have reviewed the history of present illness with the patient.  HPI  Past Medical History:  Diagnosis Date  . Breast cancer ([place]) 01/2015   right breast lumpectomy with rad tx  . 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 Depression   . Edema of both legs   . GERD (gastroesophageal reflux disease)   . Hypertension    RECENTLY TAKEN OFF OF MEDS PER MOM   . Learning disability   . Migraine    daily  . Personal history of radiation therapy   . 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 AXILLARY HIDRADENITIS EXCISION     [facility]  . AXILLARY SENTINEL NODE BIOPSY Right 02/23/2015   Procedure: AXILLARY SN BIOPSY;  Dr Jamal Collin, MD;  [facility] ORS;  ISOLATED TUMOR CELLS IN ONE LYMPH NODE (1/1).  Marland Kitchen BREAST BIOPSY Right 01/15/2016   stereo byrnett  . BREAST DUCTAL SYSTEM EXCISION Bilateral 01/26/2015   Procedure: BILATERAL EXCISION DUCTAL SYSTEM BREAST;  Surgeon: Christene Lye, MD;  Location: [facility] ORS;  Service: General;  Laterality: Bilateral;  . BREAST LUMPECTOMY Right 02/06/2015   Procedure: BREAST LUMPECTOMY;  Surgeon: Christene Lye, MD;  Location: [facility] ORS;  Service: General;  Laterality: Right;  . CARPAL TUNNEL RELEASE    . CARPAL TUNNEL RELEASE Right 05/08/2016   Procedure: OPEN CARPAL TUNNEL RELEASE;  Surgeon: Corky Mull, MD;  Location: [place];  Service: Orthopedics;  Laterality: Right;  .  EXCISION / BIOPSY BREAST / NIPPLE / DUCT Bilateral 01/26/2015   left breast negative. right breast positive  . INGUINAL HIDRADENITIS EXCISION      Family History  Problem Relation Age of Onset  . Breast cancer Sister 46  . Testicular cancer Father     Social History Social History  Substance Use Topics  . Smoking status: Never Smoker  . Smokeless tobacco: Never Used  . Alcohol use No    Allergies  Allergen Reactions  . Sumatriptan Shortness Of Breath  . Cephalexin Nausea And Vomiting  . Chocolate Swelling  . Corn Oil Other (See Comments)    By test. Pt has eaten without issues.  . Peanut Oil Swelling    throat  . Phenobarbital Other (See Comments)    Hyperactivity, aggitation  . Vilazodone Hcl Nausea Only  . Duloxetine Hcl Anxiety    agitation  . Latex Rash    Bandaids, gloves(when worn)  . Sulfa Antibiotics Rash  . Tape Rash    Current Outpatient Prescriptions  Medication Sig Dispense Refill  . ARIPiprazole (ABILIFY) 10 MG tablet Take 10 mg by mouth daily.    Marland Kitchen aspirin-acetaminophen-caffeine (EXCEDRIN MIGRAINE) 250-250-65 MG per tablet Take 1 tablet by mouth every 6 (six) hours as needed for headache. Reported on 07/27/2015    . cetirizine (ZYRTEC) 10 MG tablet Take 10 mg by mouth daily as needed for  allergies.    Marland Kitchen losartan-hydrochlorothiazide (HYZAAR) 50-12.5 MG tablet Take by mouth.    . polyethylene glycol (MIRALAX) packet Take 17 g by mouth daily. 14 each 0  . sertraline (ZOLOFT) 100 MG tablet Take 1 tablet (100 mg total) by mouth daily. 30 tablet 1  . tamoxifen (NOLVADEX) 20 MG tablet Take 1 tablet (20 mg total) by mouth daily. 30 tablet 11   No current facility-administered medications for this visit.     Review of Systems Review of Systems  Constitutional: Negative.   Respiratory: Negative.   Cardiovascular: Negative.     Blood pressure 124/76, pulse 74, resp. rate 14, height _0  (1.575 m), weight 183 lb (83 kg), last menstrual period  06/29/2016.  Physical Exam Physical Exam  Constitutional: She is oriented to person, place, and time. She appears well-developed and well-nourished.  HENT:  Mouth/Throat: Oropharynx is clear and moist.  Eyes: Conjunctivae are normal. No scleral icterus.  Neck: Neck supple.  Cardiovascular: Normal rate, regular Karen and normal heart sounds.   Pulmonary/Chest: Effort normal and breath sounds normal. Right breast exhibits skin change. Right breast exhibits no mass and no tenderness. Left breast exhibits no inverted nipple, no mass, no nipple discharge, no skin change and no tenderness.    Peau d'orange changes present on right breast from radiation. NAC is absent form her lumpectomy  Abdominal: Soft.  Lymphadenopathy:    She has no cervical adenopathy.    She has no axillary adenopathy.  Neurological: She is alert and oriented to person, place, and time.  Skin: Skin is warm and dry.  Psychiatric: Her behavior is normal.    Data Reviewed  Mammogram reviewed and stable.  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 Currently on Tamoxifen and doing well     Plan    The patient has been asked to return to the office in one year with a bilateral diagnostic mammogram with Dr Bary Castilla.      This information has been scribed by Karie Fetch RN, BSN,BC.   Aymen Widrig G 07/30/2016, 10:19 AM

## 2016-07-30 NOTE — Patient Instructions (Addendum)
The patient is aware to call back for any questions or concerns. The patient has been asked to return to the office in one year with a bilateral diagnostic mammogram with Dr Byrnett. 

## 2016-11-28 ENCOUNTER — Other Ambulatory Visit: Payer: Self-pay

## 2016-11-28 ENCOUNTER — Ambulatory Visit: Payer: Self-pay | Admitting: Oncology

## 2016-12-10 NOTE — Progress Notes (Signed)
[facility]  [phone] [phone]  ID: Karen Beard OB: 1971/04/19  MR#: [id number]  BXI#:[id number]  Patient Care Team: Maryland Pink, MD as PCP - General (Family Medicine) Maryland Pink, MD as Referring Physician (Family Medicine) Christene Lye, MD (General Surgery)  CHIEF COMPLAINT: Pathologic stage Ia ER/PR positive, HER-2/neu not overexpressing adenocarcinoma of the right breast, central portion.  INTERVAL HISTORY: Patient returns to clinic today for repeat laboratory work and further evaluation. She is tolerating tamoxifen well without significant side effects. She saw Dr. Jamal Collin in [place] for milky white discharge of the right nipple that began in late October intermittently. Dr. Jamal Collin ordered an ultrasound and results were negative. Symptoms have resolved.  She continues to be anxious. She has no neurologic complaints. She denies any recent fevers. She has a good appetite and denies weight loss. She denies any chest pain or shortness of breath. She denies any nausea, vomiting, constipation, or diarrhea. Patient offers no further specific complaints.   REVIEW OF SYSTEMS:   Review of Systems  Constitutional: Positive for malaise/fatigue. Negative for fever.  HENT: Negative.   Eyes: Negative.   Respiratory: Negative.  Negative for cough and shortness of breath.   Cardiovascular: Negative.  Negative for chest pain and leg swelling.  Gastrointestinal: Negative.  Negative for blood in stool and melena.  Genitourinary: Negative.   Musculoskeletal: Negative.   Skin: Negative.   Neurological: Negative for sensory change and weakness.  Endo/Heme/Allergies: Negative.   Psychiatric/Behavioral: The patient is nervous/anxious.     As per HPI. Otherwise, a complete review of systems is negative.  PAST MEDICAL HISTORY: Past Medical History:  Diagnosis Date  . Breast cancer ([place]) 01/2015   right breast lumpectomy with rad tx  .  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 Depression   . Edema of both legs   . GERD (gastroesophageal reflux disease)   . Hypertension    RECENTLY TAKEN OFF OF MEDS PER MOM   . Learning disability   . Migraine    daily  . Personal history of radiation therapy   . Seizures ([place])    AS A CHILD-NONE SINCE    PAST SURGICAL HISTORY: Past Surgical History:  Procedure Laterality Date  . AXILLARY HIDRADENITIS EXCISION     [facility]  . AXILLARY SENTINEL NODE BIOPSY Right 02/23/2015   Procedure: AXILLARY SN BIOPSY;  Dr Jamal Collin, MD;  [facility] ORS;  ISOLATED TUMOR CELLS IN ONE LYMPH NODE (1/1).  Marland Kitchen BREAST BIOPSY Right 01/15/2016   stereo byrnett  . BREAST DUCTAL SYSTEM EXCISION Bilateral 01/26/2015   Procedure: BILATERAL EXCISION DUCTAL SYSTEM BREAST;  Surgeon: Christene Lye, MD;  Location: [facility] ORS;  Service: General;  Laterality: Bilateral;  . BREAST LUMPECTOMY Right 02/06/2015   Procedure: BREAST LUMPECTOMY;  Surgeon: Christene Lye, MD;  Location: [facility] ORS;  Service: General;  Laterality: Right;  . CARPAL TUNNEL RELEASE    . CARPAL TUNNEL RELEASE Right 05/08/2016   Procedure: OPEN CARPAL TUNNEL RELEASE;  Surgeon: Corky Mull, MD;  Location: [place];  Service: Orthopedics;  Laterality: Right;  . EXCISION / BIOPSY BREAST / NIPPLE / DUCT Bilateral 01/26/2015   left breast negative. right breast positive  . INGUINAL HIDRADENITIS EXCISION      FAMILY HISTORY Family History  Problem Relation Age of Onset  . Breast cancer Sister 78  . Testicular cancer Father        ADVANCED DIRECTIVES:  HEALTH MAINTENANCE: Social History  Substance Use Topics  . Smoking status: Never Smoker  . Smokeless tobacco: Never Used  . Alcohol use No     Colonoscopy:  PAP:  Bone density:  Lipid panel:  Allergies  Allergen Reactions  . Sumatriptan Shortness Of Breath  . Cephalexin Nausea And Vomiting  . Chocolate Swelling  . Corn Oil Other (See  Comments)    By test. Pt has eaten without issues.  . Peanut Oil Swelling    throat  . Phenobarbital Other (See Comments)    Hyperactivity, aggitation  . Vilazodone Hcl Nausea Only  . Duloxetine Hcl Anxiety    agitation  . Latex Rash    Bandaids, gloves(when worn)  . Sulfa Antibiotics Rash  . Tape Rash    Current Outpatient Prescriptions  Medication Sig Dispense Refill  . ARIPiprazole (ABILIFY) 10 MG tablet Take 10 mg by mouth daily.    Marland Kitchen aspirin-acetaminophen-caffeine (EXCEDRIN MIGRAINE) 250-250-65 MG per tablet Take 1 tablet by mouth every 6 (six) hours as needed for headache. Reported on 07/27/2015    . cetirizine (ZYRTEC) 10 MG tablet Take 10 mg by mouth daily as needed for allergies.    . polyethylene glycol (MIRALAX) packet Take 17 g by mouth daily. 14 each 0  . sertraline (ZOLOFT) 100 MG tablet Take 1 tablet (100 mg total) by mouth daily. 30 tablet 1  . tamoxifen (NOLVADEX) 20 MG tablet Take 1 tablet (20 mg total) by mouth daily. 30 tablet 11  . losartan-hydrochlorothiazide (HYZAAR) 50-12.5 MG tablet Take by mouth.     No current facility-administered medications for this visit.     OBJECTIVE: Vitals:   12/11/16 1133  BP: (!) 177/84  Pulse: 67  Resp: 20  Temp: 98.7 F (37.1 C)     Body mass index is 32.81 kg/m.    ECOG FS:0 - Asymptomatic  General: Well-developed, well-nourished, no acute distress. Eyes: Pink conjunctiva, anicteric sclera. Breasts: Patient request exam be deferred today. Lungs: Clear to auscultation bilaterally. Heart: Regular rate and rhythm. No rubs, murmurs, or gallops. Abdomen: Soft, nontender, nondistended. No organomegaly noted, normoactive bowel sounds. Musculoskeletal: No edema, cyanosis, or clubbing. Neuro: Alert, answering all questions appropriately. Cranial nerves grossly intact. Skin: No rashes or petechiae noted. Psych: Normal affect.   LAB RESULTS:  Lab Results  Component Value Date   NA 137 03/11/2016   K 3.4 (L) 03/11/2016    CL 104 03/11/2016   CO2 25 03/11/2016   GLUCOSE 124 (H) 03/11/2016   BUN 12 03/11/2016   CREATININE 0.62 03/11/2016   CALCIUM 8.8 (L) 03/11/2016   PROT 7.0 03/11/2016   ALBUMIN 3.8 03/11/2016   AST 21 03/11/2016   ALT 13 (L) 03/11/2016   ALKPHOS 41 03/11/2016   BILITOT 0.8 03/11/2016   GFRNONAA >60 03/11/2016   GFRAA >60 03/11/2016    Lab Results  Component Value Date   WBC 4.3 12/11/2016   NEUTROABS 2.8 12/11/2016   HGB 13.1 12/11/2016   HCT 37.4 12/11/2016   MCV 80.3 12/11/2016   PLT 238 12/11/2016   Lab Results  Component Value Date   IRON 53 12/11/2016   TIBC 323 12/11/2016   IRONPCTSAT 16 12/11/2016    Lab Results  Component Value Date   FERRITIN 15 12/11/2016     STUDIES: No results found.  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 Low risk Oncotype DX.  PLAN:    1. Pathologic stage Ia ER/PR  positive, HER-2/neu not overexpressing adenocarcinoma of the right breast, central portion: No evidence of disease. Patient's most recent mammogram on July 18, 2016 was reported as BI-RADS 2. Repeat in February 2019. Continue tamoxifen daily for 5 years completing in December 2021. Return to clinic in 6 months for routine evaluation. 3. Iron deficiency anemia: Patient's hemoglobin and iron stores are within normal limits. She last received iron in October 2016. Follow up in 6 months.  4. Milky discharge from right breast: Resolved.  Patient expressed understanding and was in agreement with this plan. She also understands that She can call clinic at any time with any questions, concerns, or complaints.   Faythe Casa, NP  Patient was seen and evaluated independently and I agree with the assessment and plan as dictated above.  Lloyd Huger, MD 12/14/16 10:28 PM

## 2016-12-11 ENCOUNTER — Inpatient Hospital Stay (HOSPITAL_BASED_OUTPATIENT_CLINIC_OR_DEPARTMENT_OTHER): Payer: Medicare Other | Admitting: Oncology

## 2016-12-11 ENCOUNTER — Inpatient Hospital Stay: Payer: Medicare Other | Attending: Oncology

## 2016-12-11 VITALS — BP 177/84 | HR 67 | Temp 98.7°F | Resp 20 | Wt 179.4 lb

## 2016-12-11 DIAGNOSIS — Z923 Personal history of irradiation: Secondary | ICD-10-CM | POA: Insufficient documentation

## 2016-12-11 DIAGNOSIS — Z79899 Other long term (current) drug therapy: Secondary | ICD-10-CM | POA: Insufficient documentation

## 2016-12-11 DIAGNOSIS — C50111 Malignant neoplasm of central portion of right female breast: Secondary | ICD-10-CM | POA: Diagnosis present

## 2016-12-11 DIAGNOSIS — F419 Anxiety disorder, unspecified: Secondary | ICD-10-CM

## 2016-12-11 DIAGNOSIS — F329 Major depressive disorder, single episode, unspecified: Secondary | ICD-10-CM | POA: Diagnosis not present

## 2016-12-11 DIAGNOSIS — Z8669 Personal history of other diseases of the nervous system and sense organs: Secondary | ICD-10-CM | POA: Insufficient documentation

## 2016-12-11 DIAGNOSIS — Z170 Estrogen receptor positive status [ER+]: Secondary | ICD-10-CM

## 2016-12-11 DIAGNOSIS — Z79810 Long term (current) use of selective estrogen receptor modulators (SERMs): Secondary | ICD-10-CM

## 2016-12-11 DIAGNOSIS — Z803 Family history of malignant neoplasm of breast: Secondary | ICD-10-CM

## 2016-12-11 DIAGNOSIS — R609 Edema, unspecified: Secondary | ICD-10-CM | POA: Insufficient documentation

## 2016-12-11 DIAGNOSIS — I10 Essential (primary) hypertension: Secondary | ICD-10-CM | POA: Diagnosis not present

## 2016-12-11 DIAGNOSIS — K219 Gastro-esophageal reflux disease without esophagitis: Secondary | ICD-10-CM | POA: Diagnosis not present

## 2016-12-11 DIAGNOSIS — D509 Iron deficiency anemia, unspecified: Secondary | ICD-10-CM | POA: Diagnosis not present

## 2016-12-11 LAB — IRON AND TIBC
Iron: 53 ug/dL (ref 28–170)
SATURATION RATIOS: 16 % (ref 10.4–31.8)
TIBC: 323 ug/dL (ref 250–450)
UIBC: 270 ug/dL

## 2016-12-11 LAB — CBC WITH DIFFERENTIAL/PLATELET
Basophils Absolute: 0 10*3/uL (ref 0–0.1)
Basophils Relative: 1 %
EOS PCT: 4 %
Eosinophils Absolute: 0.2 10*3/uL (ref 0–0.7)
HEMATOCRIT: 37.4 % (ref 35.0–47.0)
HEMOGLOBIN: 13.1 g/dL (ref 12.0–16.0)
LYMPHS ABS: 1 10*3/uL (ref 1.0–3.6)
LYMPHS PCT: 23 %
MCH: 28.2 pg (ref 26.0–34.0)
MCHC: 35.2 g/dL (ref 32.0–36.0)
MCV: 80.3 fL (ref 80.0–100.0)
Monocytes Absolute: 0.3 10*3/uL (ref 0.2–0.9)
Monocytes Relative: 6 %
NEUTROS ABS: 2.8 10*3/uL (ref 1.4–6.5)
Neutrophils Relative %: 66 %
Platelets: 238 10*3/uL (ref 150–440)
RBC: 4.66 MIL/uL (ref 3.80–5.20)
RDW: 13.2 % (ref 11.5–14.5)
WBC: 4.3 10*3/uL (ref 3.6–11.0)

## 2016-12-11 LAB — FERRITIN: FERRITIN: 15 ng/mL (ref 11–307)

## 2016-12-11 NOTE — Progress Notes (Signed)
Patient reports fatigue today.  

## 2017-01-28 ENCOUNTER — Encounter: Payer: Self-pay | Admitting: General Surgery

## 2017-01-28 ENCOUNTER — Ambulatory Visit (INDEPENDENT_AMBULATORY_CARE_PROVIDER_SITE_OTHER): Payer: Medicare Other | Admitting: General Surgery

## 2017-01-28 VITALS — BP 128/74 | HR 78 | Resp 14 | Ht 62.0 in | Wt 182.0 lb

## 2017-01-28 DIAGNOSIS — L989 Disorder of the skin and subcutaneous tissue, unspecified: Secondary | ICD-10-CM | POA: Diagnosis not present

## 2017-01-28 NOTE — Progress Notes (Signed)
Patient ID: Karen Beard, female   DOB: 1970/10/04, 46 y.o.   MRN: [id number]  Chief Complaint  Patient presents with  . Other    hidradenitis     HPI Karen Beard is a 46 y.o. female here today for an evaluation for a new groin lesion. It is located in her right groin area. She states it appaered two weeks ago and began to swell and become red. It started draining last week. Denies fevers, chills.   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 HPI  Past Medical History:  Diagnosis Date  . Breast cancer ([place]) 01/2015   right breast lumpectomy with rad tx  . 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 Depression   . Edema of both legs   . GERD (gastroesophageal reflux disease)   . Hypertension    RECENTLY TAKEN OFF OF MEDS PER MOM   . Learning disability   . Migraine    daily  . Personal history of radiation therapy   . Seizures ([place])    AS A CHILD-NONE SINCE    Past Surgical History:  Procedure Laterality Date  . AXILLARY HIDRADENITIS EXCISION     [facility]  . AXILLARY SENTINEL NODE BIOPSY Right 02/23/2015   Procedure: AXILLARY SN BIOPSY;  Dr Jamal Collin, MD;  [facility] ORS;  ISOLATED TUMOR CELLS IN ONE LYMPH NODE (1/1).  Marland Kitchen BREAST BIOPSY Right 01/15/2016   stereo byrnett  . BREAST DUCTAL SYSTEM EXCISION Bilateral 01/26/2015   Procedure: BILATERAL EXCISION DUCTAL SYSTEM BREAST;  Surgeon: Christene Lye, MD;  Location: [facility] ORS;  Service: General;  Laterality: Bilateral;  . BREAST LUMPECTOMY Right 02/06/2015   Procedure: BREAST LUMPECTOMY;  Surgeon: Christene Lye, MD;  Location: [facility] ORS;  Service: General;  Laterality: Right;  . CARPAL TUNNEL RELEASE    . CARPAL TUNNEL RELEASE Right 05/08/2016   Procedure: OPEN CARPAL TUNNEL RELEASE;  Surgeon: Corky Mull, MD;  Location: [facility];  Service: Orthopedics;  Laterality: Right;  . EXCISION / BIOPSY BREAST / NIPPLE / DUCT Bilateral 01/26/2015   left breast negative. right breast positive  . INGUINAL HIDRADENITIS EXCISION      Family History  Problem Relation Age of Onset  . Breast cancer Sister 67  . Testicular cancer Father     Social History Social History  Substance Use Topics  . Smoking status: Never Smoker  . Smokeless tobacco: Never Used  . Alcohol use No    Allergies  Allergen Reactions  . Sumatriptan Shortness Of Breath  . Cephalexin Nausea And Vomiting  . Chocolate Swelling  . Corn Oil Other (See Comments)    By test. Pt has eaten without issues.  . Peanut Oil Swelling    throat  . Phenobarbital Other (See Comments)    Hyperactivity, aggitation  . Vilazodone Hcl Nausea Only  . Duloxetine Hcl Anxiety    agitation  . Latex Rash    Bandaids, gloves(when worn)  . Sulfa Antibiotics Rash  . Tape Rash    Current Outpatient Prescriptions  Medication Sig Dispense Refill  . ARIPiprazole (ABILIFY) 10 MG tablet Take 10 mg by mouth daily.    Marland Kitchen aspirin-acetaminophen-caffeine (EXCEDRIN MIGRAINE) 250-250-65 MG per tablet Take 1 tablet by mouth every 6 (six) hours as needed for headache. Reported on 07/27/2015    . cetirizine (ZYRTEC) 10 MG tablet Take 10 mg by mouth daily as needed for allergies.    Marland Kitchen  polyethylene glycol (MIRALAX) packet Take 17 g by mouth daily. 14 each 0  . sertraline (ZOLOFT) 100 MG tablet Take 1 tablet (100 mg total) by mouth daily. 30 tablet 1  . tamoxifen (NOLVADEX) 20 MG tablet Take 1 tablet (20 mg total) by mouth daily. 30 tablet 11  . losartan-hydrochlorothiazide (HYZAAR) 50-12.5 MG tablet Take by mouth.     No current facility-administered medications for this visit.     Review of Systems Review of Systems  Constitutional: Negative.   Respiratory: Negative.   Cardiovascular: Negative.     Blood pressure 128/74, pulse 78, resp. rate 14, height 5\' 2"  (1.575 m), weight 182 lb (82.6 kg).  Physical Exam Physical Exam  Constitutional: She is oriented to person, place, and time. She  appears well-developed and well-nourished.  Eyes: Conjunctivae are normal.  Genitourinary:     Lymphadenopathy:       Right: No inguinal adenopathy present.       Left: No inguinal adenopathy present.  Neurological: She is alert and oriented to person, place, and time.  Skin: Skin is warm and dry.    Data Reviewed Prior note   Assessment    Ulceration in groin region - does not present as hydradenitis similar to previous episodes in the same area. Appears to be an isolated ulcer of some kind, origin is unclear at this time. Recommend that pt keep area clean, protected, and covered with antibiotic ointment for the next two weeks before returning to office. Biopsy and swab will be taken at that time if lesion has not resolved to rule out other infectious or malignant etiologies.     Plan    Apply antibiotic ointment, keep area clean and padded to avoid further irritation.  Return in 2 weeks.      HPI, Physical Exam, Assessment and Plan have been scribed under the direction and in the presence of Mckinley Jewel, MD.  Verlene Mayer, CMA  I have completed the exam and reviewed the above documentation for accuracy and completeness.  I agree with the above.  Haematologist has been used and any errors in dictation or transcription are unintentional.  Olon Russ G. Jamal Collin, M.D., F.A.C.S.  Junie Panning G 01/28/2017, 3:00 PM

## 2017-01-28 NOTE — Patient Instructions (Signed)
Apply antibiotic ointment, keep area clean and padded to avoid further irritation.  Return in 2 weeks.

## 2017-02-11 ENCOUNTER — Ambulatory Visit: Payer: Medicare Other | Admitting: General Surgery

## 2017-03-24 ENCOUNTER — Encounter: Payer: Self-pay | Admitting: *Deleted

## 2017-05-29 ENCOUNTER — Other Ambulatory Visit: Payer: Self-pay

## 2017-05-29 DIAGNOSIS — C50111 Malignant neoplasm of central portion of right female breast: Secondary | ICD-10-CM

## 2017-05-29 DIAGNOSIS — Z170 Estrogen receptor positive status [ER+]: Principal | ICD-10-CM

## 2017-06-06 ENCOUNTER — Ambulatory Visit: Payer: Self-pay | Admitting: Radiation Oncology

## 2017-06-13 ENCOUNTER — Ambulatory Visit: Payer: Medicare Other | Admitting: Radiation Oncology

## 2017-06-20 ENCOUNTER — Other Ambulatory Visit: Payer: Self-pay

## 2017-06-20 DIAGNOSIS — D509 Iron deficiency anemia, unspecified: Secondary | ICD-10-CM

## 2017-06-22 NOTE — Progress Notes (Deleted)
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  INTERVAL HISTORY: Patient returns to clinic today for repeat laboratory work and further evaluation. She is tolerating tamoxifen well without significant side effects. She recently saw Dr. Jamal Collin for milky white discharge of the right nipple that began in late October intermittently. Dr. Jamal Collin ordered an ultrasound (results unavailable at this time) but symptoms have resolved. She recently had carpal tunnel surgery of her right hand one month ago and is doing well. She continues to be anxious. She has no neurologic complaints. She denies any recent fevers. She has a good appetite and denies weight loss. She denies any chest pain or shortness of breath. She denies any nausea, vomiting, constipation, or diarrhea. Patient offers no further specific complaints.  REVIEW OF SYSTEMS:   Review of Systems  Constitutional: Negative for fever and malaise/fatigue.  Respiratory: Negative.  Negative for cough and shortness of breath.   Cardiovascular: Negative.  Negative for chest pain and leg swelling.  Gastrointestinal: Negative.  Negative for blood in stool and melena.  Genitourinary: Negative.   Musculoskeletal: Negative.   Neurological: Negative for sensory change and weakness.  Psychiatric/Behavioral: The patient is nervous/anxious.     As per HPI. Otherwise, a complete review of systems is negative.  PAST MEDICAL HISTORY: Past Medical History:  Diagnosis Date  . Breast cancer ([place]) 01/2015   right breast lumpectomy with rad tx  . 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 Depression   . Edema of both legs   . GERD (gastroesophageal reflux disease)   . Hypertension    RECENTLY TAKEN OFF OF MEDS PER MOM   . Learning disability   . Migraine    daily  . Personal history of radiation therapy   . Seizures ([place])    AS A CHILD-NONE SINCE    PAST SURGICAL HISTORY: Past Surgical History:  Procedure Laterality Date  . AXILLARY HIDRADENITIS EXCISION     [facility]  . AXILLARY SENTINEL NODE BIOPSY Right 02/23/2015   Procedure: AXILLARY SN BIOPSY;  Dr Jamal Collin, MD;  [facility] ORS;  ISOLATED TUMOR CELLS IN ONE LYMPH NODE (1/1).  Marland Kitchen BREAST BIOPSY Right 01/15/2016   stereo [facility]  . BREAST DUCTAL SYSTEM EXCISION Bilateral 01/26/2015   Procedure: BILATERAL EXCISION DUCTAL SYSTEM BREAST;  Surgeon: Christene Lye, MD;  Location: [facility] ORS;  Service: General;  Laterality: Bilateral;  . BREAST LUMPECTOMY Right 02/06/2015   Procedure: BREAST LUMPECTOMY;  Surgeon: Christene Lye, MD;  Location: [facility] ORS;  Service: General;  Laterality: Right;  . CARPAL TUNNEL RELEASE    . CARPAL TUNNEL RELEASE Right 05/08/2016   Procedure: OPEN CARPAL TUNNEL RELEASE;  Surgeon: Corky Mull, MD;  Location: [place];  Service: Orthopedics;  Laterality: Right;  . EXCISION / BIOPSY BREAST / NIPPLE / DUCT Bilateral 01/26/2015   left breast negative. right breast positive  . INGUINAL HIDRADENITIS EXCISION      FAMILY HISTORY Family History  Problem Relation Age of Onset  . Breast cancer Sister 71  . Testicular cancer Father        ADVANCED DIRECTIVES:  HEALTH MAINTENANCE: Social History   Tobacco Use  . Smoking status: Never Smoker  . Smokeless tobacco: Never Used  Substance Use Topics  . Alcohol use: No    Alcohol/week: 0.0 oz  . Drug use: No     Colonoscopy:  PAP:  Bone density:  Lipid panel:  Allergies  Allergen Reactions  . Sumatriptan Shortness Of Breath  . Cephalexin Nausea And  Vomiting  . Chocolate Swelling  . Corn Oil Other (See Comments)    By test. Pt has eaten without issues.  . Peanut Oil Swelling    throat  . Phenobarbital Other (See Comments)    Hyperactivity, aggitation  . Vilazodone Hcl Nausea Only  . Duloxetine Hcl Anxiety    agitation  . Latex Rash    Bandaids, gloves(when worn)  . Sulfa Antibiotics Rash  . Tape Rash    Current Outpatient Medications  Medication Sig Dispense Refill  . ARIPiprazole (ABILIFY) 10 MG tablet Take 10 mg by mouth daily.    Marland Kitchen aspirin-acetaminophen-caffeine (EXCEDRIN MIGRAINE) 250-250-65 MG per tablet Take 1 tablet by mouth every 6 (six) hours as needed for headache. Reported on 07/27/2015    . cetirizine (ZYRTEC) 10 MG tablet Take 10 mg by mouth daily as needed for allergies.    Marland Kitchen losartan-hydrochlorothiazide (HYZAAR) 50-12.5 MG tablet Take by mouth.    . polyethylene glycol (MIRALAX) packet Take 17 g by mouth daily. 14 each 0  . sertraline (ZOLOFT) 100 MG tablet Take 1 tablet (100 mg total) by mouth daily. 30 tablet 1  . tamoxifen (NOLVADEX) 20 MG tablet Take 1 tablet (20 mg total) by mouth daily. 30 tablet 11   No current facility-administered medications for this visit.     OBJECTIVE: There were no vitals filed for this visit.   There is no height or weight on file to calculate BMI.    ECOG FS:0 - Asymptomatic  General: Well-developed, well-nourished, no acute distress. Eyes: Pink conjunctiva, anicteric sclera. Breasts: Patient request exam be deferred today. Lungs: Clear to auscultation bilaterally. Heart: Regular rate and rhythm. No rubs, murmurs, or gallops. Abdomen: Soft, nontender, nondistended. No organomegaly noted, normoactive bowel sounds. Musculoskeletal: No edema, cyanosis, or clubbing. Neuro: Alert, answering all questions appropriately. Cranial nerves grossly intact. Skin: No rashes or petechiae noted. Psych: Normal affect.   LAB RESULTS:  Lab Results  Component Value Date   NA 137  03/11/2016   K 3.4 (L) 03/11/2016   CL 104 03/11/2016   CO2 25 03/11/2016   GLUCOSE 124 (H) 03/11/2016   BUN 12 03/11/2016   CREATININE 0.62 03/11/2016   CALCIUM 8.8 (L) 03/11/2016   PROT 7.0 03/11/2016   ALBUMIN 3.8 03/11/2016   AST 21 03/11/2016   ALT 13 (L) 03/11/2016   ALKPHOS 41 03/11/2016   BILITOT 0.8 03/11/2016   GFRNONAA >60 03/11/2016   GFRAA >60 03/11/2016    Lab Results  Component Value Date   WBC 4.3 12/11/2016   NEUTROABS 2.8 12/11/2016   HGB 13.1 12/11/2016   HCT 37.4 12/11/2016   MCV 80.3 12/11/2016   PLT 238 12/11/2016   Lab Results  Component Value Date   IRON 53 12/11/2016   TIBC 323 12/11/2016   IRONPCTSAT 16 12/11/2016    Lab Results  Component Value Date   FERRITIN 15 12/11/2016     STUDIES: No results found.  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 Low risk Oncotype DX.  PLAN:  1. 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 Patient's most recent mammogram on July 19, 2015 was reported as BI-RADS 2. Repeat in February 2018. Continue tamoxifen daily for 5 years completing in December 2021. Return to clinic in 6 months for routine evaluation. 2. Edema/anasarca: Resolved. Patient states that swelling resolved spontaneously.  3. Heavy menses: Improved. Seeing OB/GYN for this.  4. Iron deficiency anemia: Patient's hemoglobin and iron stores are within normal limits. She does not require additional IV Feraheme today. She last received iron in October 2016. Follow up in 6 months.  5. Milky discharge from right breast: Followed by Dr. Jamal Collin. Right breast ultrasound performed on April 22, 2016. No results visible at this time. Patient states that nothing was identified as causing this problem. This discharged has stopped over the past few weeks. Patient has  follow-up scheduled with Dr. Jamal Collin.   Patient expressed understanding and was in agreement with this plan. She also understands that She can call clinic at any time with any questions, concerns, or complaints.   Faythe Casa, NP 05/29/2016 09:35 AM  Patient was seen and evaluated independently and I agree with the assessment and plan as dictated above.  Lloyd Huger, MD 06/22/17 9:38 PM

## 2017-06-25 ENCOUNTER — Inpatient Hospital Stay: Payer: Medicare Other | Admitting: Oncology

## 2017-06-25 ENCOUNTER — Inpatient Hospital Stay: Payer: Medicare Other

## 2017-06-27 ENCOUNTER — Other Ambulatory Visit: Payer: Self-pay | Admitting: Oncology

## 2017-06-27 DIAGNOSIS — Z170 Estrogen receptor positive status [ER+]: Principal | ICD-10-CM

## 2017-06-27 DIAGNOSIS — C50111 Malignant neoplasm of central portion of right female breast: Secondary | ICD-10-CM

## 2017-07-25 ENCOUNTER — Ambulatory Visit
Admission: RE | Admit: 2017-07-25 | Discharge: 2017-07-25 | Disposition: A | Discharge status: Home / Self Care | Payer: Medicare Other | Source: Ambulatory Visit | Attending: Radiation Oncology | Admitting: Radiation Oncology

## 2017-07-25 ENCOUNTER — Other Ambulatory Visit: Payer: Self-pay

## 2017-07-25 ENCOUNTER — Encounter: Payer: Self-pay | Admitting: Radiation Oncology

## 2017-07-25 VITALS — BP 128/91 | HR 84 | Temp 98.1°F | Resp 18 | Wt 203.6 lb

## 2017-07-25 DIAGNOSIS — C50111 Malignant neoplasm of central portion of right female breast: Secondary | ICD-10-CM

## 2017-07-25 DIAGNOSIS — Z170 Estrogen receptor positive status [ER+]: Principal | ICD-10-CM

## 2017-07-25 NOTE — Progress Notes (Signed)
Radiation Oncology Follow up Note  Name: Karen Beard   Date:   07/25/2017 MRN:  [id number] DOB: 12-15-1970    This 47 y.o. female presents to the clinic today for a 2 year follow-up status post whole breast radiation to her right breast for stage I invasive mammary carcinoma.  REFERRING PROVIDER: Maryland Pink, MD  HPI: Patient is a 47 year old female now seen out 1 year having completed whole breast radiation to her right breast status post wide local excision and sacrifice of the nipple areolar complex for stage I invasive mammary carcinoma. She is seen today in routine follow-up and is doing well. She specifically denies breast tenderness cough or bone pain. She's currently on tamoxifen tolerating that well without side effect.. Her last mammogram which I have reviewed was back in February was BI-RADS 2 benign.  COMPLICATIONS OF TREATMENT: none  FOLLOW UP COMPLIANCE: keeps appointments   PHYSICAL EXAM:  BP (!) 128/91   Pulse 84   Temp 98.1 F (36.7 C)   Resp 18   Wt 203 lb 9.5 oz (92.4 kg)   BMI 37.24 kg/m  Right breast is status post wide local excision including sacrifice of the nipple areolar complex. No dominant mass or nodularity is noted in either breast in 2 positions examined. No axillary or supraclavicular adenopathy is appreciated. Well-developed well-nourished patient in NAD. HEENT reveals PERLA, EOMI, discs not visualized.  Oral cavity is clear. No oral mucosal lesions are identified. Neck is clear without evidence of cervical or supraclavicular adenopathy. Lungs are clear to A&P. Cardiac examination is essentially unremarkable with regular rate and rhythm without murmur rub or thrill. Abdomen is benign with no organomegaly or masses noted. Motor sensory and DTR levels are equal and symmetric in the upper and lower extremities. Cranial nerves II through XII are grossly intact. Proprioception is intact. No peripheral adenopathy or edema is identified. No motor or sensory  levels are noted. Crude visual fields are within normal range.  RADIOLOGY RESULTS: Mammograms are reviewed and compatible with the above-stated findings   PLAN: Present time patient is doing well. She scheduled for follow-up mammograms later this month. She's currently on tamoxifen tolerating that well without side effect. She has no evidence of disease. I've asked to see her back in 1 year for follow-up. She knows to call sooner with any concerns.  I would like to take this opportunity to thank you for allowing me to participate in the care of your patient.Noreene Filbert, MD

## 2017-07-30 ENCOUNTER — Ambulatory Visit
Admission: RE | Admit: 2017-07-30 | Discharge: 2017-07-30 | Disposition: A | Discharge status: Home / Self Care | Payer: Medicare Other | Source: Ambulatory Visit | Attending: General Surgery | Admitting: General Surgery

## 2017-07-30 DIAGNOSIS — C50111 Malignant neoplasm of central portion of right female breast: Secondary | ICD-10-CM

## 2017-07-30 DIAGNOSIS — Z170 Estrogen receptor positive status [ER+]: Principal | ICD-10-CM

## 2017-08-05 ENCOUNTER — Encounter: Payer: Self-pay | Admitting: General Surgery

## 2017-08-05 ENCOUNTER — Ambulatory Visit (INDEPENDENT_AMBULATORY_CARE_PROVIDER_SITE_OTHER): Payer: Medicare Other | Admitting: General Surgery

## 2017-08-05 VITALS — BP 142/70 | HR 84 | Temp 98.1°F | Resp 16 | Ht 62.0 in | Wt 203.0 lb

## 2017-08-05 DIAGNOSIS — C50111 Malignant neoplasm of central portion of right female breast: Secondary | ICD-10-CM | POA: Diagnosis not present

## 2017-08-05 DIAGNOSIS — Z170 Estrogen receptor positive status [ER+]: Secondary | ICD-10-CM

## 2017-08-05 NOTE — Progress Notes (Signed)
Patient ID: Karen Beard, female   DOB: 1971/03/12, 47 y.o.   MRN: [id number]  Chief Complaint  Patient presents with  . Follow-up  [id number]  HPI Karen Beard is a 47 y.o. female.  who presents for her follow up breast cancer and a breast evaluation, former patient of Dr Jamal Collin. The most recent mammogram was done on 07-30-17.  Patient does perform regular self breast checks and gets regular mammograms done.  No new breast issues, tolerating tamoxifen. Right breast stereotatic biopsy was benign in 2017. She does admit to having "cold "symptoms. She is here with her mother, Karen Beard.  HPI  Past Medical History:  Diagnosis Date  . Breast cancer ([place]) 01/26/2015   Oncoltype score: 3. 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 Depression   . Edema of both legs   . GERD (gastroesophageal reflux disease)   . Hypertension    RECENTLY TAKEN OFF OF MEDS PER MOM   . Learning disability   . Migraine    daily  . Personal history of radiation therapy   . Seizures ([place])    AS A CHILD-NONE SINCE    Past Surgical History:  Procedure Laterality Date  . AXILLARY HIDRADENITIS EXCISION     [facility]  . AXILLARY SENTINEL NODE BIOPSY Right 02/23/2015   Sentinel lymph node biopsy x4, one with isolated tumor cells.;  Dr Jamal Collin, MD;  [facility] ORS;  ISOLATED TUMOR CELLS IN ONE LYMPH NODE (1/1).  Marland Kitchen BREAST BIOPSY Right 01/15/2016   stereo Dr Bary Castilla, fat necrosis  . BREAST DUCTAL SYSTEM EXCISION Bilateral 01/26/2015   Bilateral retroareolar ductal excision for bloody nipple drainage, DCIS identified on the right  . BREAST LUMPECTOMY Right 02/06/2015   Invasive mammary carcinoma.  5 mm, DCIS at margin.  Surgeon: Christene Lye, MD;  Location: [facility] ORS;  Service: General;  Laterality: Right;  . CARPAL TUNNEL RELEASE    . CARPAL TUNNEL RELEASE Right 05/08/2016   Procedure: OPEN CARPAL TUNNEL RELEASE;   Surgeon: Corky Mull, MD;  Location: [place];  Service: Orthopedics;  Laterality: Right;  . EXCISION / BIOPSY BREAST / NIPPLE / DUCT Bilateral 01/26/2015   left breast negative. right breast positive  . INGUINAL HIDRADENITIS EXCISION      Family History  Problem Relation Age of Onset  . Breast cancer Sister 57  . Testicular cancer Father     Social History Social History   Tobacco Use  . Smoking status: Never Smoker  . Smokeless tobacco: Never Used  Substance Use Topics  . Alcohol use: No    Alcohol/week: 0.0 oz  . Drug use: No    Allergies  Allergen Reactions  . Sumatriptan Shortness Of Breath  . Cephalexin Nausea And Vomiting  . Chocolate Swelling  . Corn Oil Other (See Comments)    By test. Pt has eaten without issues.  . Peanut Oil Swelling    throat  . Phenobarbital Other (See Comments)    Hyperactivity, aggitation  . Vilazodone Hcl Nausea Only  . Duloxetine Hcl Anxiety    agitation  . Latex Rash    Bandaids, gloves(when worn)  . Sulfa Antibiotics Rash  . Tape Rash    Current Outpatient Medications  Medication Sig Dispense Refill  . ARIPiprazole (ABILIFY) 10 MG tablet Take 10 mg by mouth daily.    Marland Kitchen aspirin-acetaminophen-caffeine (EXCEDRIN MIGRAINE) 250-250-65 MG per tablet Take 1 tablet  by mouth every 6 (six) hours as needed for headache. Reported on 07/27/2015    . cetirizine (ZYRTEC) 10 MG tablet Take 10 mg by mouth daily as needed for allergies.    . polyethylene glycol (MIRALAX) packet Take 17 g by mouth daily. 14 each 0  . sertraline (ZOLOFT) 100 MG tablet Take 1 tablet (100 mg total) by mouth daily. 30 tablet 1  . tamoxifen (NOLVADEX) 20 MG tablet TAKE 1 TABLET BY MOUTH DAILY 30 tablet 11  . vitamin B-12 (CYANOCOBALAMIN) 1000 MCG tablet Take by mouth.    . losartan-hydrochlorothiazide (HYZAAR) 50-12.5 MG tablet Take by mouth.     No current facility-administered medications for this visit.     Review of Systems Review of Systems   Constitutional: Negative.   Respiratory: Positive for cough.   Cardiovascular: Negative.     Blood pressure (!) 142/70, pulse 84, temperature 98.1 F (36.7 C), temperature source Oral, resp. rate 16, height '5\' 2"'$  (1.575 m), weight 203 lb (92.1 kg), SpO2 100 %.  Physical Exam Physical Exam  Constitutional: She is oriented to person, place, and time. She appears well-developed and well-nourished.  HENT:  Mouth/Throat: Oropharynx is clear and moist.  Eyes: Conjunctivae are normal. No scleral icterus.  Neck: Neck supple.  Cardiovascular: Normal rate, regular rhythm and normal heart sounds.  Pulmonary/Chest: Effort normal and breath sounds normal. Right breast exhibits no mass, no nipple discharge, no skin change and no tenderness. Left breast exhibits no inverted nipple, no mass, no nipple discharge, no skin change and no tenderness.    Right breast incisions clean  Lymphadenopathy:    She has no cervical adenopathy.    She has no axillary adenopathy.  Neurological: She is alert and oriented to person, place, and time.  Skin: Skin is warm and dry.  Psychiatric: Her behavior is normal.    Data Reviewed Bilateral diagnostic mammograms of July 30, 2017 were reviewed.  No interval change.  Postsurgical changes in the right.  BI-RADS-2.  Assessment    No evidence of recurrent tumor.    Plan    The patient has been asked to return to the office in one year with a bilateral diagnostic mammogram. The patient is aware to call back for any questions or new concerns.      HPI, Physical Exam, Assessment and Plan have been scribed under the direction and in the presence of Robert Bellow, MD. Karie Fetch, RN  I have completed the exam and reviewed the above documentation for accuracy and completeness.  I agree with the above.  Haematologist has been used and any errors in dictation or transcription are unintentional.  Hervey Ard, M.D., F.A.C.S.   Forest Gleason  Ciel Yanes 08/06/2017, 8:05 PM

## 2017-08-05 NOTE — Patient Instructions (Addendum)
The patient is aware to call back for any questions or concerns. The patient has been asked to return to the office in one year with a bilateral diagnostic mammogram. 

## 2017-08-06 ENCOUNTER — Encounter: Payer: Self-pay | Admitting: General Surgery

## 2017-11-20 ENCOUNTER — Encounter
Admission: RE | Admit: 2017-11-20 | Discharge: 2017-11-20 | Disposition: A | Discharge status: Home / Self Care | Payer: Medicare Other | Source: Ambulatory Visit | Attending: Obstetrics and Gynecology | Admitting: Obstetrics and Gynecology

## 2017-11-20 ENCOUNTER — Other Ambulatory Visit: Payer: Self-pay

## 2017-11-20 DIAGNOSIS — R9431 Abnormal electrocardiogram [ECG] [EKG]: Secondary | ICD-10-CM | POA: Insufficient documentation

## 2017-11-20 DIAGNOSIS — I517 Cardiomegaly: Secondary | ICD-10-CM | POA: Diagnosis not present

## 2017-11-20 DIAGNOSIS — I10 Essential (primary) hypertension: Secondary | ICD-10-CM | POA: Insufficient documentation

## 2017-11-20 DIAGNOSIS — Z01810 Encounter for preprocedural cardiovascular examination: Secondary | ICD-10-CM | POA: Insufficient documentation

## 2017-11-20 DIAGNOSIS — Z01812 Encounter for preprocedural laboratory examination: Secondary | ICD-10-CM | POA: Diagnosis present

## 2017-11-20 HISTORY — DX: Abnormal uterine and vaginal bleeding, unspecified: N93.9

## 2017-11-20 HISTORY — DX: Pure hyperglyceridemia: E78.1

## 2017-11-20 HISTORY — DX: Benign neoplasm of connective and other soft tissue, unspecified: D21.9

## 2017-11-20 LAB — BASIC METABOLIC PANEL
Anion gap: 9 (ref 5–15)
BUN: 11 mg/dL (ref 6–20)
CHLORIDE: 105 mmol/L (ref 101–111)
CO2: 22 mmol/L (ref 22–32)
CREATININE: 0.67 mg/dL (ref 0.44–1.00)
Calcium: 8.6 mg/dL — ABNORMAL LOW (ref 8.9–10.3)
GFR calc Af Amer: >60 mL/min (ref >60)
GFR calc non Af Amer: >60 mL/min (ref >60)
Glucose, Bld: 147 mg/dL — ABNORMAL HIGH (ref 65–99)
POTASSIUM: 3 mmol/L — AB (ref 3.5–5.1)
Sodium: 136 mmol/L (ref 135–145)

## 2017-11-20 LAB — CBC
HEMATOCRIT: 37.1 % (ref 35.0–47.0)
Hemoglobin: 12.5 g/dL (ref 12.0–16.0)
MCH: 27.3 pg (ref 26.0–34.0)
MCHC: 33.5 g/dL (ref 32.0–36.0)
MCV: 81.3 fL (ref 80.0–100.0)
PLATELETS: 236 10*3/uL (ref 150–440)
RBC: 4.57 MIL/uL (ref 3.80–5.20)
RDW: 14.6 % — AB (ref 11.5–14.5)
WBC: 5 10*3/uL (ref 3.6–11.0)

## 2017-11-20 LAB — TYPE AND SCREEN
ABO/RH(D): O NEG
ANTIBODY SCREEN: NEGATIVE

## 2017-11-20 NOTE — H&P (Signed)
Karen Beard is a 47 y.o. female here for Pre Op Consulting . Pt with hx of heavy irregular bleeding, and is requesting definitive surgery.  AUB: EMBx with simple hyperplasia SIS - 4cm fundal fibroid, thickened stripe without focal growth  On tamoxifen for  Right breast cancer 2016 (stage IA ER/PR positive, HER-2/neu not overexpressing adenocarcinoma of the right breast, low risk Oncotype DX) s/p WLE and sentinel node biopsy.  Pt is present with mother today. We discussed progesterone treatment of EIN  Past Medical History:  has a past medical history of Allergic state, Breast cancer , unspecified (CMS-HCC), Depression, Dysmenorrhea, Hemorrhoids, Hidradenitis, Migraines, Ovarian cyst, and Simple endometrial hyperplasia without atypia.  Past Surgical History:  has a past surgical history that includes Breast surgery (01/2015) and open right carpal tunnel release  (Right, 05/08/2016). Family History: family history includes Allergies in her father and mother; Coronary Artery Disease (Blocked arteries around heart) in her father; High blood pressure (Hypertension) in her father and mother; Testicular cancer in her father. Social History:  reports that she has never smoked. She has never used smokeless tobacco. She reports that she does not drink alcohol or use drugs. OB/GYN History:          OB History    Gravida  1   Para  1   Term      Preterm  1   AB      Living  1     SAB      TAB      Ectopic      Molar      Multiple      Live Births             Allergies: is allergic to imitrex  [sumatriptan]; chocolate flavor; corn; corn oil; cymbalta [duloxetine]; imitrex [sumatriptan succinate]; keflex [cephalexin]; peanut; peanut oil; phenobarbital; viibryd [vilazodone]; vilazodone hcl; adhesive tape-silicones; latex; and sulfa (sulfonamide antibiotics). Medications:  Current Outpatient Medications:  .  ARIPiprazole (ABILIFY) 10 MG tablet, Take 10 mg by mouth  once daily., Disp: , Rfl:  .  cyanocobalamin (VITAMIN B12) 1000 MCG tablet, Take 1,000 mcg by mouth once daily., Disp: , Rfl:  .  fluticasone (FLONASE) 50 mcg/actuation nasal spray, USE 2 SPRAYS INTO BOTH NOSTRILS ONCE A DAY, Disp: 16 g, Rfl: 2 .  FUROsemide (LASIX) 20 MG tablet, TAKE 1 TABLET BY MOUTH ONCE A DAY, Disp: 30 tablet, Rfl: 11 .  icosapent ethyl (VASCEPA) 1 gram Cap, Take 2 g by mouth 2 (two) times daily, Disp: 120 capsule, Rfl: 5 .  losartan-hydrochlorothiazide (HYZAAR) 50-12.5 mg tablet, Take 1 tablet by mouth once daily, Disp: 90 tablet, Rfl: 3 .  sertraline (ZOLOFT) 100 MG tablet, TAKE 1 AND 1/2 TABLETS (150 MGS TOTAL) BY MOUTH ONCE DAILY., Disp: 45 tablet, Rfl: 11 .  tamoxifen (NOLVADEX) 20 MG tablet, Take 20 mg by mouth once daily., Disp: , Rfl:   Current Facility-Administered Medications:  .  cyanocobalamin (VITAMIN B12) injection 1,000 mcg, 1,000 mcg, Intramuscular, Weekly, Lovie Macadamia, MD, 1,000 mcg at 11/17/15 3329  Review of Systems: No SOB, no palpitations or chest pain, no new lower extremity edema, no nausea or vomiting or bowel or bladder complaints. See HPI for gyn specific ROS.    Exam:   Vitals:   11/19/17 1127  BP: 122/86   Body mass index is 36.43 kg/m.  General: Well-developed, well-nourished female in no acute distress Body mass index is 36.43 kg/m. Skin: No rashes, ulcers or skin lesions noted.  No excessive hirsutism or acne noted.  Neurological: Appears alert and oriented and is a good historian. No gross abnormalities are noted. Psychological: Normal affect and mood. No signs of anxiety or depression noted.  Pelvic exam: Deferred    Impression:   The primary encounter diagnosis was Menometrorrhagia. 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    Plan:   - Patient returns for a discussion regarding treatment options for  simple hyperplasia. We discussed underlying pathophyis with patient and her mother, and she is determined to proceed with surgical treatment of her simple hyperplasia and menometrorrhagia by total laparoscopic hysterectomy with bilateral salpingectomy  procedure. We will perform a cystoscopy to evaluate the urinary tract after the procedure.   She will likely stay overnight for postop care, but will make this decision for sure after surgery.  The patient and I discussed the technical aspects of the procedure including the potential for risks and complications. These include but are not limited to the risk of infection requiring post-operative antibiotics or further procedures. We talked about the risk of injury to adjacent organs including bladder, bowel, ureter, blood vessels or nerves. We talked about the need to convert to an open incision. We talked about the possible need for blood transfusion. We talked aboutpostop complications such asthromboembolic or cardiopulmonary complications. All of her questions were answered.  Her preoperative exam was completed and the appropriate consents were signed. She is scheduled to undergo this procedure in the near future.  Specific Peri-operative Considerations:  - Consent: obtained today - Health Maintenance: up to date - Labs: CBC, CMP preoperatively - Studies: EKG, CXR preoperatively - Bowel Preparation: None required - Abx:  Cefoxitin 2g (nausea/vomiting "allergy") - VTE ppx: SCDs perioperatively - Glucose Protocol: n/a - Beta-blockade: n/a - hx of HTN, will restart meds POD#1   Return for Postop check.

## 2017-11-20 NOTE — Patient Instructions (Signed)
Your procedure is scheduled on: 11/24/17 Mon Report to Same Day Surgery 2nd floor medical mall [facility] Entrance-take elevator on left to 2nd floor.  Check in with surgery information desk.) To find out your arrival time please call [phone] between 1PM - 3PM on 11/21/17 Fri  Remember: Instructions that are not followed completely may result in serious medical risk, up to and including death, or upon the discretion of your surgeon and anesthesiologist your surgery may need to be rescheduled.    _x___ 1. Do not eat food after midnight the night before your procedure. You may drink clear liquids up to 2 hours before you are scheduled to arrive at the hospital for your procedure.  Do not drink clear liquids within 2 hours of your scheduled arrival to the hospital.  Clear liquids include  --Water or Apple juice without pulp  --Clear carbohydrate beverage such as ClearFast or Gatorade  --Black Coffee or Clear Tea (No milk, no creamers, do not add anything to                  the coffee or Tea Type 1 and type 2 diabetics should only drink water.  No gum chewing or hard candies.     __x__ 2. No Alcohol for 24 hours before or after surgery.   __x__3. No Smoking or e-cigarettes for 24 prior to surgery.  Do not use any chewable tobacco products for at least 6 hour prior to surgery   ____  4. Bring all medications with you on the day of surgery if instructed.    __x__ 5. Notify your doctor if there is any change in your medical condition     (cold, fever, infections).    x___6. On the morning of surgery brush your teeth with toothpaste and water.  You may rinse your mouth with mouth wash if you wish.  Do not swallow any toothpaste or mouthwash.   Do not wear jewelry, make-up, hairpins, clips or nail polish.  Do not wear lotions, powders, or perfumes. You may wear deodorant.  Do not shave 48 hours prior to surgery. Men may shave face and neck.  Do not bring valuables to the hospital.     [facility] is not responsible for any belongings or valuables.               Contacts, dentures or bridgework may not be worn into surgery.  Leave your suitcase in the car. After surgery it may be brought to your room.  For patients admitted to the hospital, discharge time is determined by your                       treatment team.  _  Patients discharged the day of surgery will not be allowed to drive home.  You will need someone to drive you home and stay with you the night of your procedure.    Please read over the following fact sheets that you were given:   [facility] Preparing for Surgery and or MRSA Information   _x___ Take anti-hypertensive listed below, cardiac, seizure, asthma,     anti-reflux and psychiatric medicines. These include:  1. cetirizine (ZYRTEC) 10 MG tablet  2.sertraline (ZOLOFT) 100 MG tablet  3.tamoxifen (NOLVADEX) 20 MG tablet  4.  5.  6.  ____Fleets enema or Magnesium Citrate as directed.   _x___ Use CHG Soap or sage wipes as directed on instruction sheet   ____ Use inhalers on  the day of surgery and bring to hospital day of surgery  ____ Stop Metformin and Janumet 2 days prior to surgery.    ____ Take 1/2 of usual insulin dose the night before surgery and none on the morning     surgery.   _x___ Follow recommendations from Cardiologist, Pulmonologist or PCP regarding          stopping Aspirin, Coumadin, Plavix ,Eliquis, Effient, or Pradaxa, and Pletal.  X____Stop Anti-inflammatories such as Advil, Aleve, Ibuprofen, Motrin, Naproxen, Naprosyn, Goodies powders or aspirin products. OK to take Tylenol and                          Celebrex.   _x___ Stop supplements until after surgery.  But may continue Vitamin D, Vitamin B,       and multivitamin.   ____ Bring C-Pap to the hospital.

## 2017-11-23 MED ORDER — CEFAZOLIN SODIUM-DEXTROSE 2-4 GM/100ML-% IV SOLN
2.0000 g | INTRAVENOUS | Status: AC
  Administered 2017-11-24: 2 g via INTRAVENOUS

## 2017-11-24 ENCOUNTER — Inpatient Hospital Stay: Payer: Medicare Other | Admitting: Certified Registered Nurse Anesthetist

## 2017-11-24 ENCOUNTER — Encounter
Admission: RE | Disposition: A | Discharge status: Home / Self Care | Payer: Self-pay | Source: Ambulatory Visit | Attending: Obstetrics and Gynecology

## 2017-11-24 ENCOUNTER — Other Ambulatory Visit: Payer: Self-pay

## 2017-11-24 ENCOUNTER — Ambulatory Visit
Admission: RE | Admit: 2017-11-24 | Discharge: 2017-11-24 | Disposition: A | Discharge status: Home / Self Care | Payer: Medicare Other | Source: Ambulatory Visit | Attending: Obstetrics and Gynecology | Admitting: Obstetrics and Gynecology

## 2017-11-24 ENCOUNTER — Encounter: Payer: Self-pay | Admitting: Certified Registered Nurse Anesthetist

## 2017-11-24 DIAGNOSIS — R102 Pelvic and perineal pain: Secondary | ICD-10-CM | POA: Diagnosis not present

## 2017-11-24 DIAGNOSIS — N8301 Follicular cyst of right ovary: Secondary | ICD-10-CM | POA: Insufficient documentation

## 2017-11-24 DIAGNOSIS — Z170 Estrogen receptor positive status [ER+]: Secondary | ICD-10-CM | POA: Diagnosis not present

## 2017-11-24 DIAGNOSIS — N939 Abnormal uterine and vaginal bleeding, unspecified: Secondary | ICD-10-CM | POA: Insufficient documentation

## 2017-11-24 DIAGNOSIS — Z79810 Long term (current) use of selective estrogen receptor modulators (SERMs): Secondary | ICD-10-CM | POA: Insufficient documentation

## 2017-11-24 DIAGNOSIS — Z79899 Other long term (current) drug therapy: Secondary | ICD-10-CM | POA: Insufficient documentation

## 2017-11-24 DIAGNOSIS — N8302 Follicular cyst of left ovary: Secondary | ICD-10-CM | POA: Diagnosis not present

## 2017-11-24 DIAGNOSIS — Z853 Personal history of malignant neoplasm of breast: Secondary | ICD-10-CM | POA: Insufficient documentation

## 2017-11-24 DIAGNOSIS — N888 Other specified noninflammatory disorders of cervix uteri: Secondary | ICD-10-CM | POA: Insufficient documentation

## 2017-11-24 DIAGNOSIS — Z7951 Long term (current) use of inhaled steroids: Secondary | ICD-10-CM | POA: Diagnosis not present

## 2017-11-24 DIAGNOSIS — F329 Major depressive disorder, single episode, unspecified: Secondary | ICD-10-CM | POA: Diagnosis not present

## 2017-11-24 HISTORY — PX: CYSTOSCOPY: SHX5120

## 2017-11-24 HISTORY — PX: LAPAROSCOPIC HYSTERECTOMY: SHX1926

## 2017-11-24 HISTORY — PX: LAPAROSCOPIC BILATERAL SALPINGO OOPHERECTOMY: SHX5890

## 2017-11-24 LAB — ABO/RH: ABO/RH(D): O NEG

## 2017-11-24 LAB — POCT I-STAT 4, (NA,K, GLUC, HGB,HCT)
Glucose, Bld: 116 mg/dL — ABNORMAL HIGH (ref 65–99)
HEMATOCRIT: 39 % (ref 36.0–46.0)
HEMOGLOBIN: 13.3 g/dL (ref 12.0–15.0)
POTASSIUM: 3.4 mmol/L — AB (ref 3.5–5.1)
SODIUM: 138 mmol/L (ref 135–145)

## 2017-11-24 SURGERY — HYSTERECTOMY, TOTAL, LAPAROSCOPIC
Anesthesia: General

## 2017-11-24 MED ORDER — SUGAMMADEX SODIUM 200 MG/2ML IV SOLN
INTRAVENOUS | Status: DC | PRN
  Administered 2017-11-24: 200 mg via INTRAVENOUS

## 2017-11-24 MED ORDER — CELECOXIB 200 MG PO CAPS
ORAL_CAPSULE | ORAL | Status: AC
  Administered 2017-11-24: 400 mg via ORAL
  Filled 2017-11-24: qty 2

## 2017-11-24 MED ORDER — PROPOFOL 10 MG/ML IV BOLUS
INTRAVENOUS | Status: AC
Start: 2017-11-24 — End: ?
  Filled 2017-11-24: qty 20

## 2017-11-24 MED ORDER — FAMOTIDINE 20 MG PO TABS
ORAL_TABLET | ORAL | Status: AC
  Filled 2017-11-24: qty 1

## 2017-11-24 MED ORDER — GABAPENTIN 300 MG PO CAPS
900.0000 mg | ORAL_CAPSULE | ORAL | Status: AC
  Administered 2017-11-24: 900 mg via ORAL

## 2017-11-24 MED ORDER — FAMOTIDINE 20 MG PO TABS
20.0000 mg | ORAL_TABLET | Freq: Once | ORAL | Status: AC
  Administered 2017-11-24: 20 mg via ORAL

## 2017-11-24 MED ORDER — ROCURONIUM BROMIDE 50 MG/5ML IV SOLN
INTRAVENOUS | Status: AC
  Filled 2017-11-24: qty 1

## 2017-11-24 MED ORDER — KETOROLAC TROMETHAMINE 30 MG/ML IJ SOLN
INTRAMUSCULAR | Status: AC
  Filled 2017-11-24: qty 1

## 2017-11-24 MED ORDER — FENTANYL CITRATE (PF) 100 MCG/2ML IJ SOLN
INTRAMUSCULAR | Status: AC
  Filled 2017-11-24: qty 2

## 2017-11-24 MED ORDER — FENTANYL CITRATE (PF) 250 MCG/5ML IJ SOLN
INTRAMUSCULAR | Status: AC
  Filled 2017-11-24: qty 5

## 2017-11-24 MED ORDER — ONDANSETRON HCL 4 MG/2ML IJ SOLN
INTRAMUSCULAR | Status: AC
  Filled 2017-11-24: qty 2

## 2017-11-24 MED ORDER — ONDANSETRON HCL 4 MG/2ML IJ SOLN: 4.0000 mg | Freq: Once | INTRAMUSCULAR | Status: DC | PRN

## 2017-11-24 MED ORDER — SCOPOLAMINE 1 MG/3DAYS TD PT72
MEDICATED_PATCH | TRANSDERMAL | Status: AC
  Administered 2017-11-24: 1.5 mg via TRANSDERMAL
  Filled 2017-11-24: qty 1

## 2017-11-24 MED ORDER — PROPOFOL 10 MG/ML IV BOLUS
INTRAVENOUS | Status: DC | PRN
  Administered 2017-11-24: 180 mg via INTRAVENOUS

## 2017-11-24 MED ORDER — PHENYLEPHRINE HCL 10 MG/ML IJ SOLN
INTRAMUSCULAR | Status: DC | PRN
  Administered 2017-11-24 (×4): 100 ug via INTRAVENOUS

## 2017-11-24 MED ORDER — SUGAMMADEX SODIUM 200 MG/2ML IV SOLN
INTRAVENOUS | Status: AC
  Filled 2017-11-24: qty 2

## 2017-11-24 MED ORDER — GABAPENTIN 800 MG PO TABS: 800.0000 mg | ORAL_TABLET | Freq: Every day | ORAL | 0 refills | Status: DC

## 2017-11-24 MED ORDER — IBUPROFEN 800 MG PO TABS: 800.0000 mg | ORAL_TABLET | Freq: Three times a day (TID) | ORAL | 1 refills | Status: DC | PRN

## 2017-11-24 MED ORDER — DOCUSATE SODIUM 100 MG PO CAPS: 100.0000 mg | ORAL_CAPSULE | Freq: Two times a day (BID) | ORAL | 0 refills | Status: DC

## 2017-11-24 MED ORDER — CELECOXIB 200 MG PO CAPS
400.0000 mg | ORAL_CAPSULE | ORAL | Status: AC
Start: 2017-11-24 — End: 2017-11-24
  Administered 2017-11-24: 400 mg via ORAL

## 2017-11-24 MED ORDER — MIDAZOLAM HCL 2 MG/2ML IJ SOLN
INTRAMUSCULAR | Status: DC | PRN
  Administered 2017-11-24: 2 mg via INTRAVENOUS

## 2017-11-24 MED ORDER — CEFAZOLIN SODIUM-DEXTROSE 2-4 GM/100ML-% IV SOLN
INTRAVENOUS | Status: AC
  Filled 2017-11-24: qty 100

## 2017-11-24 MED ORDER — BUPIVACAINE HCL (PF) 0.5 % IJ SOLN
INTRAMUSCULAR | Status: AC
  Filled 2017-11-24: qty 30

## 2017-11-24 MED ORDER — BUPIVACAINE HCL 0.5 % IJ SOLN
INTRAMUSCULAR | Status: DC | PRN
  Administered 2017-11-24: 15 mL

## 2017-11-24 MED ORDER — ONDANSETRON HCL 4 MG/2ML IJ SOLN
INTRAMUSCULAR | Status: DC | PRN
  Administered 2017-11-24: 4 mg via INTRAVENOUS

## 2017-11-24 MED ORDER — LIDOCAINE HCL (CARDIAC) PF 100 MG/5ML IV SOSY
PREFILLED_SYRINGE | INTRAVENOUS | Status: DC | PRN
  Administered 2017-11-24: 100 mg via INTRAVENOUS

## 2017-11-24 MED ORDER — ACETAMINOPHEN 500 MG PO TABS: 1000.0000 mg | ORAL_TABLET | Freq: Four times a day (QID) | ORAL | 0 refills | Status: AC

## 2017-11-24 MED ORDER — ACETAMINOPHEN 500 MG PO TABS
ORAL_TABLET | ORAL | Status: AC
  Administered 2017-11-24: 1000 mg via ORAL
  Filled 2017-11-24: qty 2

## 2017-11-24 MED ORDER — ROCURONIUM BROMIDE 100 MG/10ML IV SOLN
INTRAVENOUS | Status: DC | PRN
  Administered 2017-11-24: 10 mg via INTRAVENOUS
  Administered 2017-11-24: 20 mg via INTRAVENOUS
  Administered 2017-11-24: 50 mg via INTRAVENOUS
  Administered 2017-11-24 (×2): 10 mg via INTRAVENOUS

## 2017-11-24 MED ORDER — LIDOCAINE HCL (PF) 2 % IJ SOLN
INTRAMUSCULAR | Status: AC
  Filled 2017-11-24: qty 10

## 2017-11-24 MED ORDER — SCOPOLAMINE 1 MG/3DAYS TD PT72
1.0000 | MEDICATED_PATCH | TRANSDERMAL | Status: DC
  Administered 2017-11-24: 1.5 mg via TRANSDERMAL

## 2017-11-24 MED ORDER — FENTANYL CITRATE (PF) 100 MCG/2ML IJ SOLN
INTRAMUSCULAR | Status: DC | PRN
  Administered 2017-11-24: 50 ug via INTRAVENOUS
  Administered 2017-11-24: 100 ug via INTRAVENOUS
  Administered 2017-11-24 (×4): 50 ug via INTRAVENOUS

## 2017-11-24 MED ORDER — FENTANYL CITRATE (PF) 100 MCG/2ML IJ SOLN: 25.0000 ug | INTRAMUSCULAR | Status: DC | PRN

## 2017-11-24 MED ORDER — LACTATED RINGERS IV SOLN
INTRAVENOUS | Status: DC
  Administered 2017-11-24: 10:00:00 via INTRAVENOUS

## 2017-11-24 MED ORDER — DEXAMETHASONE SODIUM PHOSPHATE 10 MG/ML IJ SOLN
INTRAMUSCULAR | Status: AC
  Filled 2017-11-24: qty 1

## 2017-11-24 MED ORDER — DEXAMETHASONE SODIUM PHOSPHATE 10 MG/ML IJ SOLN
INTRAMUSCULAR | Status: DC | PRN
  Administered 2017-11-24: 10 mg via INTRAVENOUS

## 2017-11-24 MED ORDER — LACTATED RINGERS IV SOLN: INTRAVENOUS | Status: DC

## 2017-11-24 MED ORDER — ACETAMINOPHEN 500 MG PO TABS
1000.0000 mg | ORAL_TABLET | ORAL | Status: AC
  Administered 2017-11-24: 1000 mg via ORAL

## 2017-11-24 MED ORDER — GABAPENTIN 300 MG PO CAPS
ORAL_CAPSULE | ORAL | Status: AC
  Administered 2017-11-24: 900 mg via ORAL
  Filled 2017-11-24: qty 3

## 2017-11-24 MED ORDER — MIDAZOLAM HCL 2 MG/2ML IJ SOLN
INTRAMUSCULAR | Status: AC
  Filled 2017-11-24: qty 2

## 2017-11-24 MED ORDER — OXYCODONE HCL 5 MG PO CAPS: 5.0000 mg | ORAL_CAPSULE | Freq: Four times a day (QID) | ORAL | 0 refills | Status: DC | PRN

## 2017-11-24 MED ORDER — KETOROLAC TROMETHAMINE 30 MG/ML IJ SOLN
INTRAMUSCULAR | Status: DC | PRN
  Administered 2017-11-24: 30 mg via INTRAVENOUS

## 2017-11-24 SURGICAL SUPPLY — 66 items
ADH SKN CLS APL DERMABOND .7 (GAUZE/BANDAGES/DRESSINGS) ×2
APL SRG 38 LTWT LNG FL B (MISCELLANEOUS) ×2
APPLICATOR ARISTA FLEXITIP XL (MISCELLANEOUS) ×1 IMPLANT
BAG SPEC RTRVL LRG 6X4 10 (ENDOMECHANICALS)
BAG URINE DRAINAGE (UROLOGICAL SUPPLIES) ×6 IMPLANT
BLADE SURG SZ11 CARB STEEL (BLADE) ×3 IMPLANT
CATH FOLEY 2WAY  5CC 16FR (CATHETERS) ×1
CATH FOLEY 2WAY 5CC 16FR (CATHETERS) ×2
CATH ROBINSON RED A/P 16FR (CATHETERS) ×3 IMPLANT
CATH URTH 16FR FL 2W BLN LF (CATHETERS) ×2 IMPLANT
CHLORAPREP W/TINT 26ML (MISCELLANEOUS) ×3 IMPLANT
CORD MONOPOLAR M/FML 12FT (MISCELLANEOUS) ×3 IMPLANT
COUNTER NEEDLE 20/40 LG (NEEDLE) ×2 IMPLANT
COVER LIGHT HANDLE STERIS (MISCELLANEOUS) ×6 IMPLANT
DERMABOND ADVANCED (GAUZE/BANDAGES/DRESSINGS) ×1
DERMABOND ADVANCED .7 DNX12 (GAUZE/BANDAGES/DRESSINGS) ×2 IMPLANT
DEVICE SUTURE ENDOST 10MM (ENDOMECHANICALS) ×2 IMPLANT
DRAPE STERI POUCH LG 24X46 STR (DRAPES) ×3 IMPLANT
DRSG TEGADERM 2-3/8X2-3/4 SM (GAUZE/BANDAGES/DRESSINGS) ×9 IMPLANT
GLOVE BIO SURGEON STRL SZ7 (GLOVE) ×14 IMPLANT
GLOVE INDICATOR 7.5 STRL GRN (GLOVE) ×10 IMPLANT
GOWN STRL REUS W/ TWL LRG LVL3 (GOWN DISPOSABLE) ×4 IMPLANT
GOWN STRL REUS W/ TWL XL LVL3 (GOWN DISPOSABLE) ×2 IMPLANT
GOWN STRL REUS W/TWL LRG LVL3 (GOWN DISPOSABLE) ×6
GOWN STRL REUS W/TWL XL LVL3 (GOWN DISPOSABLE) ×3
HEMOSTAT ARISTA ABSORB 3G PWDR (MISCELLANEOUS) ×1 IMPLANT
IRRIGATION STRYKERFLOW (MISCELLANEOUS) ×2 IMPLANT
IRRIGATOR STRYKERFLOW (MISCELLANEOUS) ×3
IV LACTATED RINGERS 1000ML (IV SOLUTION) ×3 IMPLANT
IV NS 1000ML (IV SOLUTION) ×3
IV NS 1000ML BAXH (IV SOLUTION) ×2 IMPLANT
KIT PINK PAD W/HEAD ARE REST (MISCELLANEOUS) ×3
KIT PINK PAD W/HEAD ARM REST (MISCELLANEOUS) ×2 IMPLANT
KIT TURNOVER CYSTO (KITS) ×3 IMPLANT
LABEL OR SOLS (LABEL) ×3 IMPLANT
LIGASURE VESSEL 5MM BLUNT TIP (ELECTROSURGICAL) ×1 IMPLANT
MANIPULATOR VCARE LG CRV RETR (MISCELLANEOUS) IMPLANT
MANIPULATOR VCARE SML CRV RETR (MISCELLANEOUS) IMPLANT
MANIPULATOR VCARE STD CRV RETR (MISCELLANEOUS) ×1 IMPLANT
NS IRRIG 500ML POUR BTL (IV SOLUTION) ×3 IMPLANT
OCCLUDER COLPOPNEUMO (BALLOONS) ×3 IMPLANT
PACK GYN LAPAROSCOPIC (MISCELLANEOUS) ×3 IMPLANT
PAD OB MATERNITY 4.3X12.25 (PERSONAL CARE ITEMS) ×3 IMPLANT
PAD PREP 24X41 OB/GYN DISP (PERSONAL CARE ITEMS) ×3 IMPLANT
POUCH SPECIMEN RETRIEVAL 10MM (ENDOMECHANICALS) IMPLANT
SCISSORS METZENBAUM CVD 33 (INSTRUMENTS) IMPLANT
SET CYSTO W/LG BORE CLAMP LF (SET/KITS/TRAYS/PACK) ×3 IMPLANT
SLEEVE ENDOPATH XCEL 5M (ENDOMECHANICALS) ×3 IMPLANT
SPONGE GAUZE 2X2 8PLY STRL LF (GAUZE/BANDAGES/DRESSINGS) ×6 IMPLANT
STRIP CLOSURE SKIN 1/4X4 (GAUZE/BANDAGES/DRESSINGS) ×3 IMPLANT
SURGILUBE 2OZ TUBE FLIPTOP (MISCELLANEOUS) ×3 IMPLANT
SUT ENDO VLOC 180-0-8IN (SUTURE) ×2 IMPLANT
SUT MNCRL 4-0 (SUTURE) ×3
SUT MNCRL 4-0 27XMFL (SUTURE) ×2
SUT MNCRL AB 4-0 PS2 18 (SUTURE) ×3 IMPLANT
SUT VIC AB 0 CT1 36 (SUTURE) ×5 IMPLANT
SUT VIC AB 2-0 UR6 27 (SUTURE) ×2 IMPLANT
SUT VIC AB 4-0 SH 27 (SUTURE) ×3
SUT VIC AB 4-0 SH 27XANBCTRL (SUTURE) ×2 IMPLANT
SUTURE MNCRL 4-0 27XMF (SUTURE) ×2 IMPLANT
SYR 10ML LL (SYRINGE) ×3 IMPLANT
SYR 50ML LL SCALE MARK (SYRINGE) ×3 IMPLANT
TROCAR ENDO BLADELESS 11MM (ENDOMECHANICALS) IMPLANT
TROCAR XCEL NON-BLD 5MMX100MML (ENDOMECHANICALS) ×3 IMPLANT
TUBING INSUF HEATED (TUBING) ×3 IMPLANT
TUBING INSUFFLATION (TUBING) ×3 IMPLANT

## 2017-11-24 NOTE — Interval H&P Note (Signed)
History and Physical Interval Note:  11/24/2017 10:40 AM  Karen Beard  has presented today for surgery, with the diagnosis of abnormal uterine bleeding, fibroids, simple hyperplasia without atypia  The various methods of treatment have been discussed with the patient and family. After consideration of risks, benefits and other options for treatment, the patient has consented to  Procedure(s): HYSTERECTOMY TOTAL LAPAROSCOPIC (N/A) LAPAROSCOPIC BILATERAL SALPINGO OOPHORECTOMY (Bilateral) CYSTOSCOPY (N/A) as a surgical intervention .  The patient's history has been reviewed, patient examined, no change in status, stable for surgery.  I have reviewed the patient's chart and labs.  Questions were answered to the patient's satisfaction.     Her physical exam today is unchanged Cardiovascular: RRR, no murmurs Pulmonary: CTAB, no wheezing or decreased breath sounds   Benjaman Kindler

## 2017-11-24 NOTE — Op Note (Signed)
Karen Beard PROCEDURE DATE: 11/24/2017  PREOPERATIVE DIAGNOSIS:  - Persistent vaginal bleeding - Hx of ER+ breast cancer - Pelvic pain  POSTOPERATIVE DIAGNOSIS: The same, plus suspected adenomyosis from the shape and consistency of the uterus, plus likely endometriosis PROCEDURE: Total laparoscopic hysterectomy, bilateral salpingo- oophorectomy, cystoscopy  SURGEON:  Dr. Benjaman Kindler ASSISTANT: Dr. Vikki Ports Ward Anesthesiologist:  Anesthesiologist: Molli Barrows, MD CRNA: Aline Brochure, CRNA; Eben Burow, CRNA  INDICATIONS: 47 y.o. G0P0000  here for definitive surgical management secondary to the indications listed under preoperative diagnoses; please see preoperative note for further details.  Risks of surgery were discussed with the patient including but not limited to: bleeding which may require transfusion or reoperation; infection which may require antibiotics; injury to bowel, bladder, ureters or other surrounding organs; need for additional procedures; thromboembolic phenomenon, incisional problems and other postoperative/anesthesia complications. Written informed consent was obtained.    FINDINGS:  Globular wide uterus, normal ovaries (slightly enlarged) with normal bilateral fallopian tubes. Her left utero-sacral ligament appeared to have a site of powder-burn endometriosis. This was removed with the specimen.  ANESTHESIA:    General INTRAVENOUS FLUIDS:750  ml ESTIMATED BLOOD LOSS:100 ml URINE OUTPUT: 100 ml  SPECIMENS: Uterus, cervix, bilateral fallopian tubes and bilateral ovaries COMPLICATIONS: None immediate  PROCEDURE IN DETAIL:  The patient received prophalactic intravenous antibiotics and had sequential compression devices applied to her lower extremities while in the preoperative area.  She was then taken to the operating room where general anesthesia was administered and was found to be adequate.  She was placed in the dorsal lithotomy position, and was  prepped and draped in a sterile manner.  A formal time out was performed with all team members present and in agreement.  A V-care uterine manipulator was placed at this time.  A Foley catheter was inserted into her bladder and attached to constant drainage. Attention was turned to the abdomen where an umbilical incision was made with the scalpel.  The Optiview 5-mm trocar and sleeve were then advanced without difficulty with the laparoscope under direct visualization into the abdomen.  The abdomen was then insufflated with carbon dioxide gas and adequate pneumoperitoneum was obtained.  A survey of the patient's pelvis and abdomen revealed the findings above.  Bilateral lower quadrant ports (5 mm on the right and 10 mm on the left) were then placed under direct visualization.  The pelvis was then carefully examined.  Attention was turned to the IP ligaments. These were fulgurated and ligated, freeing the ovaries from the pelvic sidewall. The broad ligament was transected and the anterior and posterior leaflets of the broad opened. The uterine artery was then skeletonized and a bladder flap was created.  The ureters were noted to be safely away from the area of dissection.  The bladder was then bluntly dissected off the lower uterine segment.    At this point, attention was turned to the uterine vessels, which were clamped and ligated using the Ligasure.  Good hemostasis was noted overall.  The uterosacral and cardinal ligaments were clamped, cut and ligated bilaterally .  Attention was then turned to the cervicovaginal junction, and the bipolar scissors were used to transect the cervix from the surrounding vagina using the ring of the V-care as a guide. This was done circumferentially allowing total hysterectomy.  The uterus was then removed from the vagina with manual coring out of the inside and the vaginal cuff incision was then closed with running 0 vicryl.  Overall excellent hemostasis was  noted.     Attention was returned to the abdomen.The ureters were reexamined bilaterally and were pulsating normally. The abdominal pressure was reduced and hemostasis was confirmed after application of Arista to the vaginal cuff.   Cystoscopy showed bilateral ureteral jets.  No stitches were visualized in the bladder during cystoscopy.  All trocars were removed under direct visualization, and the abdomen was desufflated.  The fascial incision of the 2mm port was closed with a 0 Vicryl figure of eight stitch.  All skin incisions were closed with  Dermabond. The patient tolerated the procedures well.  All instruments, needles, and sponge counts were correct x 2. The patient was taken to the recovery room awake, extubated and in stable condition.

## 2017-11-24 NOTE — Anesthesia Preprocedure Evaluation (Signed)
Anesthesia Evaluation  Patient identified by MRN, date of birth, ID band Patient awake    Reviewed: Allergy & Precautions, H&P , NPO status , Patient's Chart, lab work & pertinent test results, reviewed documented beta blocker date and time   Airway Mallampati: II  TM Distance: >3 FB Neck ROM: full    Dental  (+) Teeth Intact   Pulmonary neg pulmonary ROS,    Pulmonary exam normal        Cardiovascular Exercise Tolerance: Good hypertension, On Medications negative cardio ROS Normal cardiovascular exam Rhythm:regular Rate:Normal     Neuro/Psych  Headaches, Seizures -,  negative neurological ROS  negative psych ROS   GI/Hepatic negative GI ROS, Neg liver ROS, GERD  Medicated,  Endo/Other  negative endocrine ROS  Renal/GU negative Renal ROS  negative genitourinary   Musculoskeletal   Abdominal   Peds  Hematology negative hematology ROS (+) anemia ,   Anesthesia Other Findings Past Medical History: No date: Abnormal vaginal bleeding 01/26/2015: Breast cancer ([place])     Comment:  Oncoltype score: 3. 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 01/15/2016: BREAST BIOPSY; Right     Comment:  stereo Dr Bary Castilla, fat necrosis 01/26/2015: BREAST DUCTAL SYSTEM EXCISION; Bilateral     Comment:  Bilateral retroareolar ductal excision for bloody nipple              drainage, DCIS identified on the right 02/06/2015: BREAST LUMPECTOMY; Right     Comment:  Invasive mammary carcinoma.  5 mm, DCIS at margin.                Surgeon: Christene Lye, MD;  Location: [facility] ORS;               Service: General;  Laterality: Right; No date: CARPAL TUNNEL RELEASE 05/08/2016: CARPAL TUNNEL RELEASE; Right     Comment:  Procedure: OPEN CARPAL TUNNEL RELEASE;  Surgeon: Corky Mull, MD;  Location: [place];  Service:               Orthopedics;  Laterality: Right; 01/26/2015: EXCISION / BIOPSY BREAST / NIPPLE / DUCT; Bilateral     Comment:  left breast negative. right breast positive No date: INGUINAL HIDRADENITIS EXCISION   Reproductive/Obstetrics negative OB  ROS                             Anesthesia Physical Anesthesia Plan  ASA: II  Anesthesia Plan: General ETT   Post-op Pain Management:    Induction:   PONV Risk Score and Plan: 4 or greater  Airway Management Planned:   Additional Equipment:   Intra-op Plan:   Post-operative Plan:   Informed Consent: I have reviewed the patients History and Physical, chart, labs and discussed the procedure including the risks, benefits and alternatives for the proposed anesthesia with the patient or authorized representative who has indicated his/her understanding and acceptance.   Dental Advisory Given  Plan Discussed with: CRNA  Anesthesia Plan Comments:         Anesthesia Quick Evaluation

## 2017-11-24 NOTE — Transfer of Care (Signed)
Immediate Anesthesia Transfer of Care Note  Patient: Karen Beard  Procedure(s) Performed: HYSTERECTOMY TOTAL LAPAROSCOPIC (N/A ) LAPAROSCOPIC BILATERAL SALPINGO OOPHORECTOMY (Bilateral ) CYSTOSCOPY (N/A )  Patient Location: PACU  Anesthesia Type:General  Level of Consciousness: drowsy  Airway & Oxygen Therapy: Patient Spontanous Breathing and Patient connected to face mask oxygen  Post-op Assessment: Report given to RN and Post -op Vital signs reviewed and stable  Post vital signs: Reviewed and stable  Last Vitals:  Vitals Value Taken Time  BP 125/71 11/24/2017  2:25 PM  Temp    Pulse 84 11/24/2017  2:28 PM  Resp 10 11/24/2017  2:28 PM  SpO2 98 % 11/24/2017  2:28 PM  Vitals shown include unvalidated device data.  Last Pain:  Vitals:   11/24/17 0957  TempSrc: Temporal  PainSc: 0-No pain      Patients Stated Pain Goal: 0 (09/81/19 1478)  Complications: No apparent anesthesia complications

## 2017-11-24 NOTE — Discharge Instructions (Signed)
Discharge instructions after   total laparoscopic hysterectomy   For the next three days, take ibuprofen and acetaminophen on a schedule, every 8 hours. You can take them together or you can intersperse them, and take one every four hours. I also gave you gabapentin for nighttime, to help you sleep and also to control pain. Take gabapentin medicines at night for at least the next 3 nights. You also have a narcotic, oxycodone, to take as needed if the above medicines don't help.  Postop constipation is a major cause of pain. Stay well hydrated, walk as you tolerate, and take over the counter senna as well as stool softeners if you need them.    Signs and Symptoms to Report Call our office at [phone] if you have any of the following.  . Fever over 100.4 degrees or higher . Severe stomach pain not relieved with pain medications . Bright red bleeding that's heavier than a period that does not slow with rest . To go the bathroom a lot (frequency), you can't hold your urine (urgency), or it hurts when you empty your bladder (urinate) . Chest pain . Shortness of breath . Pain in the calves of your legs . Severe nausea and vomiting not relieved with anti-nausea medications . Signs of infection around your wounds, such as redness, hot to touch, swelling, green/yellow drainage (like pus), bad smelling discharge . Any concerns  What You Can Expect after Surgery . You may see some pink tinged, bloody fluid and bruising around the wound. This is normal. . You may notice shoulder and neck pain. This is caused by the gas used during surgery to expand your abdomen so your surgeon could get to the uterus easier. . You may have a sore throat because of the tube in your mouth during general anesthesia. This will go away in 2 to 3 days. . You may have some stomach cramps. . You may notice spotting on your panties. . You may have pain around the incision sites.   Activities after Your  Discharge Follow these guidelines to help speed your recovery at home: . Do the coughing and deep breathing as you did in the hospital for 2 weeks. Use the small blue breathing device, called the incentive spirometer for 2 weeks. . Don't drive if you are in pain or taking narcotic pain medicine. You may drive when you can safely slam on the brakes, turn the wheel forcefully, and rotate your torso comfortably. This is typically 1-2 weeks. Practice in a parking lot or side street prior to attempting to drive regularly.  . Ask others to help with household chores for 4 weeks. . Do not lift anything heavier that 10 pounds for 4-6 weeks. This includes pets, children, and groceries. . Don't do strenuous activities, exercises, or sports like vacuuming, tennis, squash, etc. until your doctor says it is safe to do so. ---Maintain pelvic rest for 8 weeks. This means nothing in the vagina or rectum at all (no douching, tampons, intercourse) for 8 weeks.  . Walk as you feel able. Rest often since it may take two or three weeks for your energy level to return to normal.  . You may climb stairs . Avoid constipation:   -Eat fruits, vegetables, and whole grains. Eat small meals as your appetite will take time to return to normal.   -Drink 6 to 8 glasses of water each day unless your doctor has told you to limit your fluids.   -Use a laxative   or stool softener as needed if constipation becomes a problem. You may take Miralax, metamucil, Citrucil, Colace, Senekot, FiberCon, etc. If this does not relieve the constipation, try two tablespoons of Milk Of Magnesia every 8 hours until your bowels move.  °• You may shower. Gently wash the wounds with a mild soap and water. Pat dry. °• Do not get in a hot tub, swimming pool, etc. for 6 weeks. °• Do not use lotions, oils, powders on the wounds. °• Do not douche, use tampons, or have sex until your doctor says it is okay. °• Take your pain medicine when you need it. The medicine  may not work as well if the pain is bad. ° °Take the medicines you were taking before surgery. Other medications you will need are pain medications and possibly constipation and nausea medications (Zofran).  ° °

## 2017-11-24 NOTE — Anesthesia Post-op Follow-up Note (Signed)
Anesthesia QCDR form completed.        

## 2017-11-24 NOTE — Anesthesia Procedure Notes (Signed)
Procedure Name: Intubation Date/Time: 11/24/2017 11:21 AM Performed by: Eben Burow, CRNA Pre-anesthesia Checklist: Patient identified, Emergency Drugs available, Suction available, Patient being monitored and Timeout performed Patient Re-evaluated:Patient Re-evaluated prior to induction Oxygen Delivery Method: Circle system utilized Preoxygenation: Pre-oxygenation with 100% oxygen Induction Type: IV induction Ventilation: Mask ventilation without difficulty Laryngoscope Size: Miller and 2 Grade View: Grade I Tube type: Oral Tube size: 7.0 mm Number of attempts: 1 Airway Equipment and Method: Stylet and LTA kit utilized Placement Confirmation: ETT inserted through vocal cords under direct vision,  positive ETCO2 and breath sounds checked- equal and bilateral Secured at: 21 cm Tube secured with: Tape Dental Injury: Teeth and Oropharynx as per pre-operative assessment

## 2017-11-25 ENCOUNTER — Encounter: Payer: Self-pay | Admitting: Obstetrics and Gynecology

## 2017-11-25 NOTE — Anesthesia Postprocedure Evaluation (Signed)
Anesthesia Post Note  Patient: RAEVYN SOKOL  Procedure(s) Performed: HYSTERECTOMY TOTAL LAPAROSCOPIC (N/A ) LAPAROSCOPIC BILATERAL SALPINGO OOPHORECTOMY (Bilateral ) CYSTOSCOPY (N/A )  Patient location during evaluation: PACU Anesthesia Type: General Level of consciousness: awake and alert Pain management: pain level controlled Vital Signs Assessment: post-procedure vital signs reviewed and stable Respiratory status: spontaneous breathing, nonlabored ventilation, respiratory function stable and patient connected to nasal cannula oxygen Cardiovascular status: blood pressure returned to baseline and stable Postop Assessment: no apparent nausea or vomiting Anesthetic complications: no     Last Vitals:  Vitals:   11/24/17 1520 11/24/17 1555  BP: (!) 131/91 132/82  Pulse: (!) 105 (!) 104  Resp: 16 16  Temp: (!) 35.7 C   SpO2: 95% 97%    Last Pain:  Vitals:   11/24/17 1555  TempSrc:   PainSc: 0-No pain                 Molli Barrows

## 2017-11-25 NOTE — Discharge Summary (Signed)
Pt discharged home from PACU, no overnight stay.

## 2017-11-27 LAB — SURGICAL PATHOLOGY

## 2018-04-01 ENCOUNTER — Other Ambulatory Visit: Payer: Self-pay

## 2018-04-01 ENCOUNTER — Emergency Department: Payer: Medicare Other

## 2018-04-01 ENCOUNTER — Encounter: Payer: Self-pay | Admitting: Emergency Medicine

## 2018-04-01 ENCOUNTER — Emergency Department
Admission: EM | Admit: 2018-04-01 | Discharge: 2018-04-01 | Disposition: A | Discharge status: Home / Self Care | Payer: Medicare Other | Attending: Emergency Medicine | Admitting: Emergency Medicine

## 2018-04-01 DIAGNOSIS — Z79899 Other long term (current) drug therapy: Secondary | ICD-10-CM | POA: Diagnosis not present

## 2018-04-01 DIAGNOSIS — Z91040 Latex allergy status: Secondary | ICD-10-CM | POA: Insufficient documentation

## 2018-04-01 DIAGNOSIS — C50919 Malignant neoplasm of unspecified site of unspecified female breast: Secondary | ICD-10-CM | POA: Insufficient documentation

## 2018-04-01 DIAGNOSIS — Z91010 Allergy to peanuts: Secondary | ICD-10-CM | POA: Insufficient documentation

## 2018-04-01 DIAGNOSIS — I10 Essential (primary) hypertension: Secondary | ICD-10-CM | POA: Diagnosis not present

## 2018-04-01 DIAGNOSIS — R0602 Shortness of breath: Secondary | ICD-10-CM | POA: Diagnosis present

## 2018-04-01 DIAGNOSIS — R609 Edema, unspecified: Secondary | ICD-10-CM | POA: Diagnosis not present

## 2018-04-01 LAB — TROPONIN I

## 2018-04-01 LAB — BASIC METABOLIC PANEL
Anion gap: 10 (ref 5–15)
BUN: 11 mg/dL (ref 6–20)
CO2: 26 mmol/L (ref 22–32)
CREATININE: 0.65 mg/dL (ref 0.44–1.00)
Calcium: 8.8 mg/dL — ABNORMAL LOW (ref 8.9–10.3)
Chloride: 104 mmol/L (ref 98–111)
GFR calc Af Amer: >60 mL/min (ref >60)
Glucose, Bld: 118 mg/dL — ABNORMAL HIGH (ref 70–99)
Potassium: 3 mmol/L — ABNORMAL LOW (ref 3.5–5.1)
SODIUM: 140 mmol/L (ref 135–145)

## 2018-04-01 LAB — CBC
HCT: 37.1 % (ref 36.0–46.0)
Hemoglobin: 12 g/dL (ref 12.0–15.0)
MCH: 25.4 pg — ABNORMAL LOW (ref 26.0–34.0)
MCHC: 32.3 g/dL (ref 30.0–36.0)
MCV: 78.6 fL — ABNORMAL LOW (ref 80.0–100.0)
Platelets: 267 10*3/uL (ref 150–400)
RBC: 4.72 MIL/uL (ref 3.87–5.11)
RDW: 13.6 % (ref 11.5–15.5)
WBC: 5 10*3/uL (ref 4.0–10.5)
nRBC: 0 % (ref 0.0–0.2)

## 2018-04-01 LAB — FIBRIN DERIVATIVES D-DIMER (ARMC ONLY): Fibrin derivatives D-dimer (ARMC): 376.68 ng/mL (FEU) (ref 0.00–499.00)

## 2018-04-01 LAB — BRAIN NATRIURETIC PEPTIDE: B NATRIURETIC PEPTIDE 5: 11 pg/mL (ref 0.0–100.0)

## 2018-04-01 NOTE — ED Notes (Signed)
NAD noted at time of D/C. Pt and mother state understanding of D/C instructions, denies comments/concerns. Pt refused wheelchair to the lobby. Pt ambulatory to the lobby at this time.

## 2018-04-01 NOTE — ED Notes (Signed)
Pt has returned from [place]. NAD noted. Family remains in room. bed locked and in lowest position.

## 2018-04-01 NOTE — Discharge Instructions (Addendum)
Your work-up today shows no evidence of any blood clot (you had a negative blood test called a d-dimer and ultrasound of the legs showed no clots there).  Follow-up with Dr. Gorden Harms office as instructed and continue the medication you are on and the compression stockings.  Return to the ER for new, worsening, or persistent shortness of breath, chest pain, swelling, weakness, or any other new or worsening symptoms that concern you.

## 2018-04-01 NOTE — ED Provider Notes (Signed)
[facility] Emergency Department Provider Note ____________________________________________   First MD Initiated Contact with Patient 04/01/18 1740     (approximate)  I have reviewed the triage vital signs and the nursing notes.   HISTORY  Chief Complaint Shortness of Breath and Leg Swelling    HPI Karen Beard is a 47 y.o. female with PMH as noted below who presents with bilateral lower extremity swelling over the last several weeks, gradual onset, not improved despite being put on a fluid pill and wearing compression stockings.  She also has some increased shortness of breath over the last few weeks and intermittent chest discomfort.  No prior history of this symptom.  The patient states she was sent to the ED by her primary care doctor.   Past Medical History:  Diagnosis Date  . Abnormal vaginal bleeding   . Breast cancer ([place]) 01/26/2015   Oncoltype score: 3. 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 Depression   . Edema of both legs   . Fibroids   . GERD (gastroesophageal reflux disease)   . High triglycerides   . Hypertension    RECENTLY TAKEN OFF OF MEDS PER MOM   . Learning disability   . Migraine    daily  . Personal history of radiation therapy   . Seizures ([place])    AS A CHILD-NONE SINCE    Patient Active Problem List   Diagnosis Date Noted  . 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 Migraine headache 03/12/2016  . Cancer of central portion of right female breast ([place]) 03/15/2015  . Iron deficiency anemia 03/07/2015  . Family history of breast cancer 01/17/2015    Past Surgical History:  Procedure Laterality Date  . ABDOMINAL HYSTERECTOMY    . AXILLARY HIDRADENITIS EXCISION     unc  . AXILLARY SENTINEL NODE BIOPSY Right 02/23/2015   Sentinel lymph node biopsy x4, one with isolated tumor cells.;  Dr Jamal Collin, MD;   [facility] ORS;  ISOLATED TUMOR CELLS IN ONE LYMPH NODE (1/1).  Marland Kitchen BREAST BIOPSY Right 01/15/2016   stereo Dr Bary Castilla, fat necrosis  . BREAST DUCTAL SYSTEM EXCISION Bilateral 01/26/2015   Bilateral retroareolar ductal excision for bloody nipple drainage, DCIS identified on the right  . BREAST LUMPECTOMY Right 02/06/2015   Invasive mammary carcinoma.  5 mm, DCIS at margin.  Surgeon: Christene Lye, MD;  Location: [facility] ORS;  Service: General;  Laterality: Right;  . CARPAL TUNNEL RELEASE    . CARPAL TUNNEL RELEASE Right 05/08/2016   Procedure: OPEN CARPAL TUNNEL RELEASE;  Surgeon: Corky Mull, MD;  Location: [place];  Service: Orthopedics;  Laterality: Right;  . CYSTOSCOPY N/A 11/24/2017   Procedure: CYSTOSCOPY;  Surgeon: Benjaman Kindler, MD;  Location: [facility] ORS;  Service: Gynecology;  Laterality: N/A;  . EXCISION / BIOPSY BREAST / NIPPLE / DUCT Bilateral 01/26/2015   left breast negative. right breast positive  . INGUINAL HIDRADENITIS EXCISION    . LAPAROSCOPIC BILATERAL SALPINGO OOPHERECTOMY Bilateral 11/24/2017   Procedure: LAPAROSCOPIC BILATERAL SALPINGO OOPHORECTOMY;  Surgeon: Benjaman Kindler, MD;  Location: [facility] ORS;  Service: Gynecology;  Laterality: Bilateral;  . LAPAROSCOPIC HYSTERECTOMY N/A 11/24/2017   Procedure: HYSTERECTOMY TOTAL LAPAROSCOPIC;  Surgeon: Benjaman Kindler, MD;  Location: [facility] ORS;  Service: Gynecology;  Laterality: N/A;    Prior to Admission medications   Medication Sig Start Date End Date Taking? Authorizing Provider  aspirin-acetaminophen-caffeine (EXCEDRIN MIGRAINE)  250-250-65 MG per tablet Take 1 tablet by mouth every 6 (six) hours as needed for headache.     [provider]  cetirizine (ZYRTEC) 10 MG tablet Take 10 mg by mouth daily as needed for allergies.     [provider]  docusate sodium (COLACE) 100 MG capsule Take 1 capsule (100 mg total) by mouth 2 (two) times daily. To keep stools soft 11/24/17   Benjaman Kindler, MD    gabapentin (NEURONTIN) 800 MG tablet Take 1 tablet (800 mg total) by mouth at bedtime for 14 days. Take nightly for 3 days, then up to 14 days as needed 11/24/17 12/08/17  Benjaman Kindler, MD  ibuprofen (ADVIL,MOTRIN) 800 MG tablet Take 1 tablet (800 mg total) by mouth every 8 (eight) hours as needed for moderate pain. 11/24/17   Benjaman Kindler, MD  Icosapent Ethyl (VASCEPA) 1 g CAPS Take 2 g by mouth daily with lunch.    [provider]  losartan-hydrochlorothiazide (HYZAAR) 50-12.5 MG tablet Take 1 tablet by mouth daily.  10/12/15 11/20/17  [provider]  oxycodone (OXY-IR) 5 MG capsule Take 1 capsule (5 mg total) by mouth every 6 (six) hours as needed for pain. 11/24/17   Benjaman Kindler, MD  polyethylene glycol [facility]) packet Take 17 g by mouth daily. Patient taking differently: Take 17 g by mouth daily as needed for moderate constipation.  09/08/15   Lavonia Drafts, MD  sertraline (ZOLOFT) 100 MG tablet Take 1 tablet (100 mg total) by mouth daily. 03/15/16   Pucilowska, Herma Ard B, MD  tamoxifen (NOLVADEX) 20 MG tablet TAKE 1 TABLET BY MOUTH DAILY 06/28/17   Lloyd Huger, MD    Allergies Sumatriptan; Cephalexin; Chocolate; Corn oil; Peanut oil; Phenobarbital; Vilazodone hcl; Duloxetine hcl; Latex; Sulfa antibiotics; and Tape  Family History  Problem Relation Age of Onset  . Breast cancer Sister 34  . Testicular cancer Father     Social History Social History   Tobacco Use  . Smoking status: Never Smoker  . Smokeless tobacco: Never Used  Substance Use Topics  . Alcohol use: No    Alcohol/week: 0.0 standard drinks  . Drug use: No    Review of Systems  Constitutional: No fever. Eyes: No redness. ENT: No sore throat. Cardiovascular: Positive for intermittent chest pain. Respiratory: Positive for shortness of breath. Gastrointestinal: No vomiting.  Genitourinary: Negative for dysuria or frequency.  Musculoskeletal: Negative for back pain. Skin:  Negative for rash. Neurological: Negative for headaches, focal weakness or numbness.   ____________________________________________   PHYSICAL EXAM:  VITAL SIGNS: ED Triage Vitals  Enc Vitals Group     BP 04/01/18 1553 140/84     Pulse Rate 04/01/18 1553 85     Resp 04/01/18 1553 20     Temp 04/01/18 1553 97.8 F (36.6 C)     Temp Source 04/01/18 1553 Oral     SpO2 04/01/18 1553 96 %     Weight 04/01/18 1553 192 lb (87.1 kg)     Height 04/01/18 1553 5' 2" (1.575 m)     Head Circumference --      Peak Flow --      Pain Score 04/01/18 1602 8     Pain Loc --      Pain Edu? --      Excl. in [place]? --     Constitutional: Alert and oriented.  Relatively well appearing and in no acute distress. Eyes: Conjunctivae are normal.  Head: Atraumatic. Nose: No congestion/rhinnorhea. Mouth/Throat: Mucous  membranes are moist.   Neck: Normal range of motion.  Cardiovascular: Normal rate, regular rhythm. Grossly normal heart sounds.  Good peripheral circulation. Respiratory: Normal respiratory effort.  No retractions. Lungs CTAB. Gastrointestinal: No distention.  Musculoskeletal: Trace bilateral lower extremity edema.  Mild tenderness to calves and distal thighs.  Extremities warm and well perfused.  Neurologic:  Normal speech and language. No gross focal neurologic deficits are appreciated.  Skin:  Skin is warm and dry. No rash noted. Psychiatric: Mood and affect are normal. Speech and behavior are normal.  ____________________________________________   LABS (all labs ordered are listed, but only abnormal results are displayed)  Labs Reviewed  BASIC METABOLIC PANEL - Abnormal; Notable for the following components:      Result Value   Potassium 3.0 (*)    Glucose, Bld 118 (*)    Calcium 8.8 (*)    All other components within normal limits  CBC - Abnormal; Notable for the following components:   MCV 78.6 (*)    MCH 25.4 (*)    All other components within normal limits  TROPONIN I    BRAIN NATRIURETIC PEPTIDE  FIBRIN DERIVATIVES D-DIMER ([facility] ONLY)   ____________________________________________  EKG  ED ECG REPORT I, Arta Silence, the attending physician, personally viewed and interpreted this ECG.  Date: 04/01/2018 EKG Time: 1600 Rate: 80 Rhythm: normal sinus rhythm QRS Axis: normal Intervals: normal ST/T Wave abnormalities: normal Narrative Interpretation: no evidence of acute ischemia  ____________________________________________  RADIOLOGY  CXR: No focal infiltrate or edema  ____________________________________________   PROCEDURES  Procedure(s) performed: No  Procedures  Critical Care performed: No ____________________________________________   INITIAL IMPRESSION / ASSESSMENT AND PLAN / ED COURSE  Pertinent labs & imaging results that were available during my care of the patient were reviewed by me and considered in my medical decision making (see chart for details).  47 year old female with PMH as noted above but no known prior cardiac history presents with gradual onset of bilateral lower extremity swelling as well as some shortness of breath and intermittent chest discomfort over the last few weeks.  I reviewed the past medical records in epic and reviewed the note from the [place] covering for the patient's PMD Dr. Chrystine Oiler who saw her today.  The patient was referred to the emergency department for rule out of PE/DVT.  On exam the patient is relatively well-appearing although slightly anxious.  Her vital signs are normal.  The remainder of the exam is as described above.  Overall I suspect most likely dependent edema, venous insufficiency or other relatively benign cause.  However given the patient's cancer history she is at somewhat increased risk for DVT/PE.  We will obtain d-dimer and DVT studies, chest x-ray, basic and cardiac labs, and reassess.  ----------------------------------------- 7:25 PM on  04/01/2018 -----------------------------------------  The chest x-ray is unremarkable.  Lab work-up is significant only for borderline low potassium.  Ultrasound shows no evidence of DVT, the patient's d-dimer is negative.  In this patient with low pretest probability for PE, normal vital signs, no other specific evidence for PE, this is sufficient to rule PE out.  Additionally I would like to rule out unnecessary radiation this young patient.  I counseled the patient on the results of the work-up and instructed her to follow-up with her PMD.  She feels comfortable to go home.  Return precautions given, and the patient expresses understanding. ____________________________________________   FINAL CLINICAL IMPRESSION(S) / ED DIAGNOSES  Final diagnoses:  Peripheral edema  NEW MEDICATIONS STARTED DURING THIS VISIT:  New Prescriptions   No medications on file     Note:  This document was prepared using Dragon voice recognition software and may include unintentional dictation errors.    Arta Silence, MD 04/01/18 [phone]

## 2018-04-01 NOTE — ED Triage Notes (Signed)
Pt arrived via POV with mother with reports of SOB x 2 weeks and increased swelling to lower extremities and hands.  Pt seen by PCP and was told to wear compression stockings and started on a fluid pill for the past couple of weeks.   Pt also reports intermittent chest pain and non-productive cough worse when lying down.

## 2018-06-11 ENCOUNTER — Emergency Department
Admission: EM | Admit: 2018-06-11 | Discharge: 2018-06-12 | Disposition: A | Discharge status: Other Acute Inpatient Hospital | Payer: Medicare Other | Attending: Emergency Medicine | Admitting: Emergency Medicine

## 2018-06-11 DIAGNOSIS — I10 Essential (primary) hypertension: Secondary | ICD-10-CM | POA: Insufficient documentation

## 2018-06-11 DIAGNOSIS — Z79899 Other long term (current) drug therapy: Secondary | ICD-10-CM | POA: Insufficient documentation

## 2018-06-11 DIAGNOSIS — F29 Unspecified psychosis not due to a substance or known physiological condition: Secondary | ICD-10-CM

## 2018-06-11 DIAGNOSIS — Z7982 Long term (current) use of aspirin: Secondary | ICD-10-CM | POA: Insufficient documentation

## 2018-06-11 DIAGNOSIS — F329 Major depressive disorder, single episode, unspecified: Secondary | ICD-10-CM | POA: Insufficient documentation

## 2018-06-11 LAB — COMPREHENSIVE METABOLIC PANEL
ALT: 23 U/L (ref 0–44)
ANION GAP: 7 (ref 5–15)
AST: 30 U/L (ref 15–41)
Albumin: 4 g/dL (ref 3.5–5.0)
Alkaline Phosphatase: 57 U/L (ref 38–126)
BUN: 14 mg/dL (ref 6–20)
CHLORIDE: 102 mmol/L (ref 98–111)
CO2: 29 mmol/L (ref 22–32)
Calcium: 8.7 mg/dL — ABNORMAL LOW (ref 8.9–10.3)
Creatinine, Ser: 0.9 mg/dL (ref 0.44–1.00)
Glucose, Bld: 108 mg/dL — ABNORMAL HIGH (ref 70–99)
Potassium: 2.8 mmol/L — ABNORMAL LOW (ref 3.5–5.1)
SODIUM: 138 mmol/L (ref 135–145)
Total Bilirubin: 0.8 mg/dL (ref 0.3–1.2)
Total Protein: 7 g/dL (ref 6.5–8.1)

## 2018-06-11 LAB — URINALYSIS, COMPLETE (UACMP) WITH MICROSCOPIC
Bilirubin Urine: NEGATIVE
Glucose, UA: NEGATIVE mg/dL
Hgb urine dipstick: NEGATIVE
Ketones, ur: NEGATIVE mg/dL
Nitrite: NEGATIVE
PH: 6 (ref 5.0–8.0)
Protein, ur: 30 mg/dL — AB
Specific Gravity, Urine: 1.03 (ref 1.005–1.030)

## 2018-06-11 LAB — URINE DRUG SCREEN, QUALITATIVE (ARMC ONLY)
AMPHETAMINES, UR SCREEN: NOT DETECTED
BARBITURATES, UR SCREEN: NOT DETECTED
Benzodiazepine, Ur Scrn: NOT DETECTED
COCAINE METABOLITE, UR ~~LOC~~: NOT DETECTED
Cannabinoid 50 Ng, Ur ~~LOC~~: NOT DETECTED
MDMA (ECSTASY) UR SCREEN: NOT DETECTED
METHADONE SCREEN, URINE: NOT DETECTED
OPIATE, UR SCREEN: NOT DETECTED
Phencyclidine (PCP) Ur S: NOT DETECTED
TRICYCLIC, UR SCREEN: NOT DETECTED

## 2018-06-11 LAB — CBC WITH DIFFERENTIAL/PLATELET
ABS IMMATURE GRANULOCYTES: 0.01 10*3/uL (ref 0.00–0.07)
BASOS PCT: 1 %
Basophils Absolute: 0 10*3/uL (ref 0.0–0.1)
EOS ABS: 0.5 10*3/uL (ref 0.0–0.5)
Eosinophils Relative: 8 %
HCT: 40.9 % (ref 36.0–46.0)
Hemoglobin: 13.2 g/dL (ref 12.0–15.0)
Immature Granulocytes: 0 %
Lymphocytes Relative: 21 %
Lymphs Abs: 1.3 10*3/uL (ref 0.7–4.0)
MCH: 25.7 pg — AB (ref 26.0–34.0)
MCHC: 32.3 g/dL (ref 30.0–36.0)
MCV: 79.6 fL — AB (ref 80.0–100.0)
MONO ABS: 0.4 10*3/uL (ref 0.1–1.0)
MONOS PCT: 7 %
NEUTROS ABS: 3.8 10*3/uL (ref 1.7–7.7)
NEUTROS PCT: 63 %
PLATELETS: 279 10*3/uL (ref 150–400)
RBC: 5.14 MIL/uL — AB (ref 3.87–5.11)
RDW: 13.4 % (ref 11.5–15.5)
WBC: 6.1 10*3/uL (ref 4.0–10.5)
nRBC: 0 % (ref 0.0–0.2)

## 2018-06-11 LAB — ETHANOL

## 2018-06-11 LAB — SALICYLATE LEVEL: Salicylate Lvl: <7 mg/dL (ref 2.8–30.0)

## 2018-06-11 NOTE — ED Notes (Signed)
Hourly rounding reveals patient in room. No complaints, stable, in no acute distress. Q15 minute rounds and monitoring via Rover and Officer to continue.   

## 2018-06-11 NOTE — ED Notes (Signed)
Pt changed into burgundy scrubs in room 20. Pt belongings placed in hospital belongings bag, labeled and numbered 1 of 1. Belongings consisted of blue jeans, underwear, shirt, bra, shoes, socks. Pt cooperative and labs were drawn.

## 2018-06-11 NOTE — ED Notes (Signed)
Patient talking to the doctor

## 2018-06-11 NOTE — ED Notes (Signed)
Pt's mother Mallisa [place] [phone] called to check on her daughter. She said to call if the patient is going to be discharge since she is her ride.
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
## 2018-06-11 NOTE — ED Notes (Signed)
Called [facility] for consult  [phone]

## 2018-06-11 NOTE — ED Notes (Signed)
Patient assigned to appropriate care area   Introduced self to pt  Patient oriented to unit/care area: Informed that, for their safety, care areas are designed for safety and visiting and phone hours explained to patient. Patient verbalizes understanding, and verbal contract for safety obtained  Environment secured  

## 2018-06-11 NOTE — ED Notes (Signed)
Report to include Situation, Background, Assessment, and Recommendations received from Jadeka RN. Patient alert and oriented, warm and dry, in no acute distress. Patient denies SI, HI, AVH and pain. Patient made aware of Q15 minute rounds and Rover and Officer presence for their safety. Patient instructed to come to me with needs or concerns.  
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
## 2018-06-11 NOTE — ED Triage Notes (Signed)
Wants to admit self for depression.  amublatory and has person with her.

## 2018-06-11 NOTE — ED Notes (Signed)
Pt given supper tray.
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
## 2018-06-12 ENCOUNTER — Other Ambulatory Visit: Payer: Self-pay

## 2018-06-12 ENCOUNTER — Inpatient Hospital Stay
Admission: AD | Admit: 2018-06-12 | Discharge: 2018-06-15 | DRG: 885 | Disposition: A | Discharge status: Home / Self Care | Payer: Medicare Other | Source: Intra-hospital | Attending: Psychiatry | Admitting: Psychiatry

## 2018-06-12 DIAGNOSIS — F329 Major depressive disorder, single episode, unspecified: Secondary | ICD-10-CM | POA: Diagnosis present

## 2018-06-12 DIAGNOSIS — Z853 Personal history of malignant neoplasm of breast: Secondary | ICD-10-CM

## 2018-06-12 DIAGNOSIS — E781 Pure hyperglyceridemia: Secondary | ICD-10-CM | POA: Diagnosis present

## 2018-06-12 DIAGNOSIS — Z882 Allergy status to sulfonamides status: Secondary | ICD-10-CM | POA: Diagnosis not present

## 2018-06-12 DIAGNOSIS — Z888 Allergy status to other drugs, medicaments and biological substances status: Secondary | ICD-10-CM

## 2018-06-12 DIAGNOSIS — F203 Undifferentiated schizophrenia: Secondary | ICD-10-CM | POA: Diagnosis present

## 2018-06-12 DIAGNOSIS — F70 Mild intellectual disabilities: Secondary | ICD-10-CM | POA: Diagnosis present

## 2018-06-12 DIAGNOSIS — G43909 Migraine, unspecified, not intractable, without status migrainosus: Secondary | ICD-10-CM | POA: Diagnosis present

## 2018-06-12 DIAGNOSIS — K219 Gastro-esophageal reflux disease without esophagitis: Secondary | ICD-10-CM | POA: Diagnosis present

## 2018-06-12 DIAGNOSIS — Z881 Allergy status to other antibiotic agents status: Secondary | ICD-10-CM

## 2018-06-12 DIAGNOSIS — N390 Urinary tract infection, site not specified: Secondary | ICD-10-CM | POA: Diagnosis present

## 2018-06-12 DIAGNOSIS — Z9114 Patient's other noncompliance with medication regimen: Secondary | ICD-10-CM

## 2018-06-12 DIAGNOSIS — Z923 Personal history of irradiation: Secondary | ICD-10-CM | POA: Diagnosis not present

## 2018-06-12 DIAGNOSIS — Z79899 Other long term (current) drug therapy: Secondary | ICD-10-CM | POA: Diagnosis not present

## 2018-06-12 DIAGNOSIS — Z91040 Latex allergy status: Secondary | ICD-10-CM

## 2018-06-12 DIAGNOSIS — I10 Essential (primary) hypertension: Secondary | ICD-10-CM

## 2018-06-12 DIAGNOSIS — Z91010 Allergy to peanuts: Secondary | ICD-10-CM

## 2018-06-12 LAB — LIPID PANEL
Cholesterol: 175 mg/dL (ref 0–200)
HDL: 31 mg/dL — ABNORMAL LOW (ref >40)
LDL Cholesterol: 90 mg/dL (ref 0–99)
Total CHOL/HDL Ratio: 5.6 RATIO
Triglycerides: 270 mg/dL — ABNORMAL HIGH (ref <150)
VLDL: 54 mg/dL — ABNORMAL HIGH (ref 0–40)

## 2018-06-12 LAB — HEMOGLOBIN A1C
Hgb A1c MFr Bld: 5.1 % (ref 4.8–5.6)
Mean Plasma Glucose: 99.67 mg/dL

## 2018-06-12 LAB — TSH: TSH: 1.721 u[IU]/mL (ref 0.350–4.500)

## 2018-06-12 MED ORDER — FOSFOMYCIN TROMETHAMINE 3 G PO PACK
3.0000 g | PACK | Freq: Once | ORAL | Status: AC
  Administered 2018-06-12: 3 g via ORAL
  Filled 2018-06-12: qty 3

## 2018-06-12 MED ORDER — ARIPIPRAZOLE 10 MG PO TABS
10.0000 mg | ORAL_TABLET | Freq: Every day | ORAL | Status: DC
  Administered 2018-06-12: 10 mg via ORAL
  Filled 2018-06-12: qty 1

## 2018-06-12 MED ORDER — ARIPIPRAZOLE 10 MG PO TABS
20.0000 mg | ORAL_TABLET | Freq: Every day | ORAL | Status: DC
  Administered 2018-06-13 – 2018-06-15 (×3): 20 mg via ORAL
  Filled 2018-06-12 (×3): qty 2

## 2018-06-12 MED ORDER — ASPIRIN-ACETAMINOPHEN-CAFFEINE 250-250-65 MG PO TABS: 1.0000 | ORAL_TABLET | Freq: Four times a day (QID) | ORAL | Status: DC | PRN

## 2018-06-12 MED ORDER — TEMAZEPAM 15 MG PO CAPS
30.0000 mg | ORAL_CAPSULE | Freq: Every day | ORAL | Status: DC
  Administered 2018-06-12: 30 mg via ORAL
  Filled 2018-06-12: qty 2

## 2018-06-12 MED ORDER — DOCUSATE SODIUM 100 MG PO CAPS
100.0000 mg | ORAL_CAPSULE | Freq: Two times a day (BID) | ORAL | Status: DC
  Administered 2018-06-12 – 2018-06-15 (×5): 100 mg via ORAL
  Filled 2018-06-12 (×6): qty 1

## 2018-06-12 MED ORDER — ACETAMINOPHEN 325 MG PO TABS
650.0000 mg | ORAL_TABLET | Freq: Four times a day (QID) | ORAL | Status: DC | PRN
  Administered 2018-06-13: 650 mg via ORAL
  Filled 2018-06-12: qty 2

## 2018-06-12 MED ORDER — HYDROCHLOROTHIAZIDE 12.5 MG PO CAPS
12.5000 mg | ORAL_CAPSULE | Freq: Every day | ORAL | Status: DC
  Administered 2018-06-12 – 2018-06-15 (×4): 12.5 mg via ORAL
  Filled 2018-06-12 (×4): qty 1

## 2018-06-12 MED ORDER — TRAZODONE HCL 100 MG PO TABS
100.0000 mg | ORAL_TABLET | Freq: Every evening | ORAL | Status: DC | PRN
  Administered 2018-06-12: 100 mg via ORAL
  Filled 2018-06-12: qty 1

## 2018-06-12 MED ORDER — ALUM & MAG HYDROXIDE-SIMETH 200-200-20 MG/5ML PO SUSP: 30.0000 mL | ORAL | Status: DC | PRN

## 2018-06-12 MED ORDER — SERTRALINE HCL 100 MG PO TABS
100.0000 mg | ORAL_TABLET | Freq: Every day | ORAL | Status: DC
  Administered 2018-06-12 – 2018-06-15 (×4): 100 mg via ORAL
  Filled 2018-06-12 (×4): qty 1

## 2018-06-12 MED ORDER — MAGNESIUM HYDROXIDE 400 MG/5ML PO SUSP: 30.0000 mL | Freq: Every day | ORAL | Status: DC | PRN

## 2018-06-12 MED ORDER — LOSARTAN POTASSIUM 50 MG PO TABS
50.0000 mg | ORAL_TABLET | Freq: Every day | ORAL | Status: DC
  Administered 2018-06-12 – 2018-06-15 (×4): 50 mg via ORAL
  Filled 2018-06-12 (×5): qty 1

## 2018-06-12 MED ORDER — LORATADINE 10 MG PO TABS
10.0000 mg | ORAL_TABLET | Freq: Every day | ORAL | Status: DC
  Administered 2018-06-12 – 2018-06-15 (×4): 10 mg via ORAL
  Filled 2018-06-12 (×4): qty 1

## 2018-06-12 MED ORDER — HYDROXYZINE HCL 50 MG PO TABS: 50.0000 mg | ORAL_TABLET | Freq: Three times a day (TID) | ORAL | Status: DC | PRN

## 2018-06-12 MED ORDER — LOSARTAN POTASSIUM-HCTZ 50-12.5 MG PO TABS: 1.0000 | ORAL_TABLET | Freq: Every day | ORAL | Status: DC

## 2018-06-12 NOTE — Progress Notes (Addendum)
D - Patient received on the unit from [facility] Emergency Department at 0100. Skin check completed with [place], RN, patient has history of breast cancer and has had breast lumpectomy. No contraband found during assessment of patient and patient belongings.   Patient presented in a pleasant but anxious manner. Patient appears to be responding to internal stimuli. Patient looking around the room appearing to see something that isn't there. Patient stated, "I am really and I want to just go to bed." Patient was oriented to the unit and her room and patient went to sleep.   A - Patient given education. Patient given support and encouraged to attend group and be active in her treatment.   R - Patient being monitored Q 15 minutes for safety per unit protocol. Patient remains safe on the unit at this time.

## 2018-06-12 NOTE — ED Notes (Signed)
Hourly rounding reveals patient in room. No complaints, stable, in no acute distress. Q15 minute rounds and monitoring via Rover and Officer to continue.   

## 2018-06-12 NOTE — Progress Notes (Signed)
[facility] MD Progress Note  06/13/2018 8:52 AM JELANI VREELAND  MRN:  [id number]  Subjective:    Ms. Gilcrease feel better already. She slept well with Restoril. Paranoid thoughts are not as intrussive and she is no longer frightened. Her mother and sister visited last night and it went well. Thy will be back tonight.  The patient was maintained on oral Abilify at [facility] for a while and did well. She is taking 20 mg of Abilify now. She agreed to [employer] monthly injections. We will start today.  Lifelong headaches usually treated with Excedrin. Start topamax 100 mg daily.  Principal Problem: Undifferentiated schizophrenia ([place]) Diagnosis: Principal Problem:   Undifferentiated schizophrenia ([place]) Active Problems:   Migraine headache   HTN (hypertension)   Mild intellectual disability  Total Time spent with patient: 20 minutes  Past Psychiatric History: schizophrenia  Past Medical History:  Past Medical History:  Diagnosis Date  . Abnormal vaginal bleeding   . Breast cancer ([place]) 01/26/2015   Oncoltype score: 3. 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 Depression   . Edema of both legs   . Fibroids   . GERD (gastroesophageal reflux disease)   . High triglycerides   . Hypertension    RECENTLY TAKEN OFF OF MEDS PER MOM   . Learning disability   . Migraine    daily  . Personal history of radiation therapy   . Seizures ([place])    AS A CHILD-NONE SINCE    Past Surgical History:  Procedure Laterality Date  . ABDOMINAL HYSTERECTOMY    . AXILLARY HIDRADENITIS EXCISION     unc  . AXILLARY SENTINEL NODE BIOPSY Right 02/23/2015   Sentinel lymph node biopsy x4, one with isolated tumor cells.;  Dr Jamal Collin, MD;  [facility] ORS;  ISOLATED TUMOR CELLS IN ONE LYMPH NODE (1/1).  Marland Kitchen BREAST BIOPSY Right 01/15/2016   stereo Dr Bary Castilla, fat necrosis  . BREAST DUCTAL SYSTEM EXCISION Bilateral  01/26/2015   Bilateral retroareolar ductal excision for bloody nipple drainage, DCIS identified on the right  . BREAST LUMPECTOMY Right 02/06/2015   Invasive mammary carcinoma.  5 mm, DCIS at margin.  Surgeon: Christene Lye, MD;  Location: [facility] ORS;  Service: General;  Laterality: Right;  . CARPAL TUNNEL RELEASE    . CARPAL TUNNEL RELEASE Right 05/08/2016   Procedure: OPEN CARPAL TUNNEL RELEASE;  Surgeon: Corky Mull, MD;  Location: [place];  Service: Orthopedics;  Laterality: Right;  . CYSTOSCOPY N/A 11/24/2017   Procedure: CYSTOSCOPY;  Surgeon: Benjaman Kindler, MD;  Location: [facility] ORS;  Service: Gynecology;  Laterality: N/A;  . EXCISION / BIOPSY BREAST / NIPPLE / DUCT Bilateral 01/26/2015   left breast negative. right breast positive  . INGUINAL HIDRADENITIS EXCISION    . LAPAROSCOPIC BILATERAL SALPINGO OOPHERECTOMY Bilateral 11/24/2017   Procedure: LAPAROSCOPIC BILATERAL SALPINGO OOPHORECTOMY;  Surgeon: Benjaman Kindler, MD;  Location: [facility] ORS;  Service: Gynecology;  Laterality: Bilateral;  . LAPAROSCOPIC HYSTERECTOMY N/A 11/24/2017   Procedure: HYSTERECTOMY TOTAL LAPAROSCOPIC;  Surgeon: Benjaman Kindler, MD;  Location: [facility] ORS;  Service: Gynecology;  Laterality: N/A;   Family History:  Family History  Problem Relation Age of Onset  . Breast cancer Sister 38  . Testicular cancer Father    Family Psychiatric  History: none Social History:  Social History   Substance and Sexual Activity  Alcohol Use No  . Alcohol/week: 0.0 standard drinks  Social History   Substance and Sexual Activity  Drug Use No    Social History   Socioeconomic History  . Marital status: Single    Spouse name: Not on file  . Number of children: Not on file  . Years of education: Not on file  . Highest education level: Not on file  Occupational History  . Not on file  Social Needs  . Financial resource strain: Not on file  . Food insecurity:    Worry: Not on file     Inability: Not on file  . Transportation needs:    Medical: Not on file    Non-medical: Not on file  Tobacco Use  . Smoking status: Never Smoker  . Smokeless tobacco: Never Used  Substance and Sexual Activity  . Alcohol use: No    Alcohol/week: 0.0 standard drinks  . Drug use: No  . Sexual activity: Not on file  Lifestyle  . Physical activity:    Days per week: Not on file    Minutes per session: Not on file  . Stress: Not on file  Relationships  . Social connections:    Talks on phone: Not on file    Gets together: Not on file    Attends religious service: Not on file    Active member of club or organization: Not on file    Attends meetings of clubs or organizations: Not on file    Relationship status: Not on file  Other Topics Concern  . Not on file  Social History Narrative  . Not on file   Additional Social History:                         Sleep: Fair  Appetite:  Fair  Current Medications: Current Facility-Administered Medications  Medication Dose Route Frequency Provider Last Rate Last Dose  . acetaminophen (TYLENOL) tablet 650 mg  650 mg Oral Q6H PRN Clapacs, Madie Reno, MD   650 mg at 06/13/18 0827  . alum & mag hydroxide-simeth (MAALOX/MYLANTA) 200-200-20 MG/5ML suspension 30 mL  30 mL Oral Q4H PRN Clapacs, John T, MD      . ARIPiprazole (ABILIFY) tablet 20 mg  20 mg Oral Daily Kamron Portee B, MD   20 mg at 06/13/18 0824  . ARIPiprazole ER (ABILIFY MAINTENA) injection 400 mg  400 mg Intramuscular Q28 days Hayzen Lorenson B, MD      . aspirin-acetaminophen-caffeine (EXCEDRIN MIGRAINE) per tablet 1 tablet  1 tablet Oral Q6H PRN Clapacs, John T, MD      . docusate sodium (COLACE) capsule 100 mg  100 mg Oral BID Clapacs, Madie Reno, MD   100 mg at 06/13/18 0824  . losartan (COZAAR) tablet 50 mg  50 mg Oral Daily Clapacs, John T, MD   50 mg at 06/13/18 0825   And  . hydrochlorothiazide (MICROZIDE) capsule 12.5 mg  12.5 mg Oral Daily Clapacs, John T, MD    12.5 mg at 06/13/18 5449  . hydrOXYzine (ATARAX/VISTARIL) tablet 50 mg  50 mg Oral TID PRN Clapacs, John T, MD      . loratadine (CLARITIN) tablet 10 mg  10 mg Oral Daily Clapacs, Madie Reno, MD   10 mg at 06/13/18 2010  . magnesium hydroxide (MILK OF MAGNESIA) suspension 30 mL  30 mL Oral Daily PRN Clapacs, John T, MD      . sertraline (ZOLOFT) tablet 100 mg  100 mg Oral Daily Clapacs, Madie Reno, MD  100 mg at 06/13/18 0824  . temazepam (RESTORIL) capsule 7.5 mg  7.5 mg Oral QHS Zannie Runkle B, MD      . topiramate (TOPAMAX) tablet 100 mg  100 mg Oral Daily Wise Fees B, MD      . traZODone (DESYREL) tablet 100 mg  100 mg Oral QHS PRN Clapacs, Madie Reno, MD   100 mg at 06/12/18 2200    Lab Results:  Results for orders placed or performed during the hospital encounter of 06/12/18 (from the past 48 hour(s))  Hemoglobin A1c     Status: None   Collection Time: 06/12/18  7:02 AM  Result Value Ref Range   Hgb A1c MFr Bld 5.1 4.8 - 5.6 %    Comment: (NOTE) Pre diabetes:          5.7%-6.4% Diabetes:              >6.4% Glycemic control for   <7.0% adults with diabetes    Mean Plasma Glucose 99.67 mg/dL    Comment: Performed at [facility], [place] [address], [place], [place] [address]  Lipid panel     Status: Abnormal   Collection Time: 06/12/18  7:02 AM  Result Value Ref Range   Cholesterol 175 0 - 200 mg/dL   Triglycerides 270 (H) <150 mg/dL   HDL 31 (L) >40 mg/dL   Total CHOL/HDL Ratio 5.6 RATIO   VLDL 54 (H) 0 - 40 mg/dL   LDL Cholesterol 90 0 - 99 mg/dL    Comment:        Total Cholesterol/HDL:CHD Risk Coronary Heart Disease Risk Table                     Men   Women  1/2 Average Risk   3.4   3.3  Average Risk       5.0   4.4  2 X Average Risk   9.6   7.1  3 X Average Risk  23.4   11.0        Use the calculated Patient Ratio above and the CHD Risk Table to determine the patient's CHD Risk.        ATP III CLASSIFICATION (LDL):  <100     mg/dL   Optimal  100-129   mg/dL   Near or Above                    Optimal  130-159  mg/dL   Borderline  160-189  mg/dL   High  >190     mg/dL   Very High Performed at [facility], [address] [place], [place] [address]   TSH     Status: None   Collection Time: 06/12/18  7:02 AM  Result Value Ref Range   TSH 1.721 0.350 - 4.500 uIU/mL    Comment: Performed by a 3rd Generation assay with a functional sensitivity of <=0.01 uIU/mL. Performed at [facility], [address] [place], [place] [address]     Blood Alcohol level:  Lab Results  Component Value Date   [facility] <10 06/11/2018   ETH <5 54/62/7035    Metabolic Disorder Labs: Lab Results  Component Value Date   HGBA1C 5.1 06/12/2018   MPG 99.67 06/12/2018   MPG 100 03/13/2016   No results found for: PROLACTIN Lab Results  Component Value Date   CHOL 175 06/12/2018   TRIG 270 (H) 06/12/2018   HDL 31 (L) 06/12/2018  CHOLHDL 5.6 06/12/2018   VLDL 54 (H) 06/12/2018   LDLCALC 90 06/12/2018   LDLCALC 109 (H) 03/13/2016    Physical Findings: AIMS:  , ,  ,  ,    CIWA:    COWS:     Musculoskeletal: Strength & Muscle Tone: within normal limits Gait & Station: normal Patient leans: N/A  Psychiatric Specialty Exam: Physical Exam  Nursing note and vitals reviewed. Psychiatric: Her speech is normal and behavior is normal. Her affect is blunt. Thought content is paranoid and delusional. Cognition and memory are impaired. She expresses impulsivity.    Review of Systems  Neurological: Negative.   Psychiatric/Behavioral: Positive for hallucinations.  All other systems reviewed and are negative.   Blood pressure 118/73, pulse 83, temperature 98.3 F (36.8 C), temperature source Oral, resp. rate 16, height '5\' 2"'$  (1.575 m), weight 80.7 kg, last menstrual period 11/10/2017, SpO2 100 %.Body mass index is 32.56 kg/m.  General Appearance: Casual and Guarded  Eye Contact:  Good  Speech:  Clear and Coherent  Volume:  Normal   Mood:  Depressed  Affect:  Blunt  Thought Process:  Irrelevant  Orientation:  Full (Time, Place, and Person)  Thought Content:  Delusions and Paranoid Ideation  Suicidal Thoughts:  No  Homicidal Thoughts:  No  Memory:  Immediate;   Poor Recent;   Poor Remote;   Poor  Judgement:  Poor  Insight:  Lacking  Psychomotor Activity:  Decreased  Concentration:  Concentration: Poor and Attention Span: Poor  Recall:  Poor  Fund of Knowledge:  Poor  Language:  Fair  Akathisia:  Yes  Handed:  Right  AIMS (if indicated):     Assets:  Communication Skills Desire for Improvement Financial Resources/Insurance Housing Physical Health Resilience Social Support  ADL's:  Intact  Cognition:  WNL  Sleep:  Number of Hours: 7.15     Treatment Plan Summary: Daily contact with patient to assess and evaluate symptoms and progress in treatment and Medication management   Ms. Hazelett is a 47 year old female with a history of schizpohrenia admitted floridly psychotic in the context of medication noncompliance for two years. She is already improving on Abilify.  #Psychosis, improving -Abilify 20 mg daily -start Abilify maintena 400 mg monthly injections 12/28 -Zoloft 100 mg daily  #Insomnia -lower Restoril 7.5 mg nightly  #HTN -continue Cozaar 50 mg and HCTZ 12.5 mg   #Mograine headaches -Excedrin PRN available -start Topamax 100 mg daily  #UTI -received Fosfomycin 3 g  #Labs -lipid panel shows elevated TG, TSH, A1C are normal -EKG -pregnancy test  #Disposition -discharge to home with the mother -follow up with [facility] or better ACT team   Orson Slick, MD 06/13/2018, 8:52 AM

## 2018-06-12 NOTE — Tx Team (Addendum)
Interdisciplinary Treatment and Diagnostic Plan Update  06/12/2018 Time of Session: [place] MRN: [id number]  Principal Diagnosis: Undifferentiated schizophrenia [facility])  Secondary Diagnoses: Principal Problem:   Undifferentiated schizophrenia ([place]) Active Problems:   Migraine headache   HTN (hypertension)   Mild intellectual disability   Current Medications:  Current Facility-Administered Medications  Medication Dose Route Frequency Provider Last Rate Last Dose  . acetaminophen (TYLENOL) tablet 650 mg  650 mg Oral Q6H PRN Clapacs, John T, MD      . alum & mag hydroxide-simeth (MAALOX/MYLANTA) 200-200-20 MG/5ML suspension 30 mL  30 mL Oral Q4H PRN Clapacs, Madie Reno, MD      . Derrill Memo ON 06/13/2018] ARIPiprazole (ABILIFY) tablet 20 mg  20 mg Oral Daily Pucilowska, Jolanta B, MD      . aspirin-acetaminophen-caffeine (EXCEDRIN MIGRAINE) per tablet 1 tablet  1 tablet Oral Q6H PRN Clapacs, John T, MD      . docusate sodium (COLACE) capsule 100 mg  100 mg Oral BID Clapacs, Madie Reno, MD   100 mg at 06/12/18 0819  . fosfomycin (MONUROL) packet 3 g  3 g Oral Once Pucilowska, Jolanta B, MD      . losartan (COZAAR) tablet 50 mg  50 mg Oral Daily Clapacs, John T, MD   50 mg at 06/12/18 6606   And  . hydrochlorothiazide (MICROZIDE) capsule 12.5 mg  12.5 mg Oral Daily Clapacs, John T, MD   12.5 mg at 06/12/18 3016  . hydrOXYzine (ATARAX/VISTARIL) tablet 50 mg  50 mg Oral TID PRN Clapacs, John T, MD      . loratadine (CLARITIN) tablet 10 mg  10 mg Oral Daily Clapacs, Madie Reno, MD   10 mg at 06/12/18 0818  . magnesium hydroxide (MILK OF MAGNESIA) suspension 30 mL  30 mL Oral Daily PRN Clapacs, John T, MD      . sertraline (ZOLOFT) tablet 100 mg  100 mg Oral Daily Clapacs, Madie Reno, MD   100 mg at 06/12/18 0819  . temazepam (RESTORIL) capsule 30 mg  30 mg Oral QHS Pucilowska, Jolanta B, MD      . traZODone (DESYREL) tablet 100 mg  100 mg Oral QHS PRN Clapacs, Madie Reno, MD       PTA  Medications: Medications Prior to Admission  Medication Sig Dispense Refill Last Dose  . aspirin-acetaminophen-caffeine (EXCEDRIN MIGRAINE) 250-250-65 MG per tablet Take 1 tablet by mouth every 6 (six) hours as needed for headache.    Past Week at Unknown time  . cetirizine (ZYRTEC) 10 MG tablet Take 10 mg by mouth daily as needed for allergies.    Past Week at Unknown time  . docusate sodium (COLACE) 100 MG capsule Take 1 capsule (100 mg total) by mouth 2 (two) times daily. To keep stools soft 30 capsule 0   . gabapentin (NEURONTIN) 800 MG tablet Take 1 tablet (800 mg total) by mouth at bedtime for 14 days. Take nightly for 3 days, then up to 14 days as needed 14 tablet 0   . ibuprofen (ADVIL,MOTRIN) 800 MG tablet Take 1 tablet (800 mg total) by mouth every 8 (eight) hours as needed for moderate pain. 30 tablet 1   . Icosapent Ethyl (VASCEPA) 1 g CAPS Take 2 g by mouth daily with lunch.   11/23/2017 at Unknown time  . losartan-hydrochlorothiazide (HYZAAR) 50-12.5 MG tablet Take 1 tablet by mouth daily.    11/23/2017 at Unknown time  . oxycodone (OXY-IR) 5 MG capsule Take 1 capsule (5  mg total) by mouth every 6 (six) hours as needed for pain. 15 capsule 0   . polyethylene glycol (MIRALAX) packet Take 17 g by mouth daily. (Patient taking differently: Take 17 g by mouth daily as needed for moderate constipation. ) 14 each 0 11/21/2017  . sertraline (ZOLOFT) 100 MG tablet Take 1 tablet (100 mg total) by mouth daily. 30 tablet 1 11/23/2017 at Unknown time  . tamoxifen (NOLVADEX) 20 MG tablet TAKE 1 TABLET BY MOUTH DAILY 30 tablet 11 11/23/2017 at Unknown time    Patient Stressors: Financial difficulties Health problems  Patient Strengths: Motivation for treatment/growth Supportive family/friends  Treatment Modalities: Medication Management, Group therapy, Case management,  1 to 1 session with clinician, Psychoeducation, Recreational therapy.   Physician Treatment Plan for Primary Diagnosis:  Undifferentiated schizophrenia ([place]) Long Term Goal(s): Improvement in symptoms so as ready for discharge Improvement in symptoms so as ready for discharge   Short Term Goals: Ability to identify changes in lifestyle to reduce recurrence of condition will improve Ability to verbalize feelings will improve Ability to disclose and discuss suicidal ideas Ability to demonstrate self-control will improve Ability to identify and develop effective coping behaviors will improve Ability to maintain clinical measurements within normal limits will improve Compliance with prescribed medications will improve Ability to identify triggers associated with substance abuse/mental health issues will improve NA  Medication Management: Evaluate patient's response, side effects, and tolerance of medication regimen.  Therapeutic Interventions: 1 to 1 sessions, Unit Group sessions and Medication administration.  Evaluation of Outcomes: Not Met  Physician Treatment Plan for Secondary Diagnosis: Principal Problem:   Undifferentiated schizophrenia ([place]) Active Problems:   Migraine headache   HTN (hypertension)   Mild intellectual disability  Long Term Goal(s): Improvement in symptoms so as ready for discharge Improvement in symptoms so as ready for discharge   Short Term Goals: Ability to identify changes in lifestyle to reduce recurrence of condition will improve Ability to verbalize feelings will improve Ability to disclose and discuss suicidal ideas Ability to demonstrate self-control will improve Ability to identify and develop effective coping behaviors will improve Ability to maintain clinical measurements within normal limits will improve Compliance with prescribed medications will improve Ability to identify triggers associated with substance abuse/mental health issues will improve NA     Medication Management: Evaluate patient's response, side effects, and tolerance of medication  regimen.  Therapeutic Interventions: 1 to 1 sessions, Unit Group sessions and Medication administration.  Evaluation of Outcomes: Not Met   RN Treatment Plan for Primary Diagnosis: Undifferentiated schizophrenia ([place]) Long Term Goal(s): Knowledge of disease and therapeutic regimen to maintain health will improve  Short Term Goals: Ability to identify and develop effective coping behaviors will improve and Compliance with prescribed medications will improve  Medication Management: RN will administer medications as ordered by provider, will assess and evaluate patient's response and provide education to patient for prescribed medication. RN will report any adverse and/or side effects to prescribing provider.  Therapeutic Interventions: 1 on 1 counseling sessions, Psychoeducation, Medication administration, Evaluate responses to treatment, Monitor vital signs and CBGs as ordered, Perform/monitor CIWA, COWS, AIMS and Fall Risk screenings as ordered, Perform wound care treatments as ordered.  Evaluation of Outcomes: Not Met   LCSW Treatment Plan for Primary Diagnosis: Undifferentiated schizophrenia ([place]) Long Term Goal(s): Safe transition to appropriate next level of care at discharge, Engage patient in therapeutic group addressing interpersonal concerns.  Short Term Goals: Engage patient in aftercare planning with referrals and resources, Increase social support  and Increase skills for wellness and recovery  Therapeutic Interventions: Assess for all discharge needs, 1 to 1 time with Social worker, Explore available resources and support systems, Assess for adequacy in community support network, Educate family and significant other(s) on suicide prevention, Complete Psychosocial Assessment, Interpersonal group therapy.  Evaluation of Outcomes: Not Met   Progress in Treatment: Attending groups: No. Participating in groups: No. Taking medication as prescribed: Yes. Toleration medication:  Yes. Family/Significant other contact made: No, will contact:  when given permission Patient understands diagnosis: No. Discussing patient identified problems/goals with staff: Yes. Medical problems stabilized or resolved: Yes. Denies suicidal/homicidal ideation: Yes. Issues/concerns per patient self-inventory: No. Other: none  New problem(s) identified: No, Describe:  none  New Short Term/Long Term Goal(s):  Patient Goals:  Sleep less, be more active.   Discharge Plan or Barriers:   Reason for Continuation of Hospitalization: Delusions  Medication stabilization  Estimated Length of Stay: 3-5 days.  Attendees: Patient:Karen Beard November 06/12/2018   Physician: Dr. Bary Leriche, MD 06/12/2018   Nursing:  06/12/2018   RN Care Manager: 06/12/2018   Social Worker: Lurline Idol, LCSW 06/12/2018   Recreational Therapist:  06/12/2018   Other:  06/12/2018   Other:  06/12/2018   Other: 06/12/2018        Scribe for Treatment Team: Joanne Chars, LCSW 06/12/2018 3:16 PM

## 2018-06-12 NOTE — H&P (Signed)
Psychiatric Admission Assessment Adult  Patient Identification: Karen Beard MRN:  [id number] Date of Evaluation:  06/12/2018 Chief Complaint:  Major Depressive Disorder Principal Diagnosis: Undifferentiated schizophrenia ([place]) Diagnosis:  Principal Problem:   Undifferentiated schizophrenia ([place]) Active Problems:   Migraine headache   HTN (hypertension)   Mild intellectual disability  History of Present Illness:   Identifying data. Karen Beard is a 47 year old female with a history of schizophrenia.  Chief complaint. "The man across the street made me sad."  History of present illness. Information was obtained from the patient and the chart. The patient was brought to the hospital by her mother for worsening of psychotic and depressive symptoms. The patient firmly believes that for the past two years she has been in witness protection having a personal guard, because her husband wants to kill her, has a listening device implanted in her mouth and has been under unbearable duress from police surveillance. The man who has been watching her from across the street has been particularly oppressive. She reports sadness, crying spells, poor sleep and decreased appetite with weight loss. She feels anxious all the time. She adamantly denies any psychotic symptoms. There are no substance involved.  Past psychiatric history. She was hospitalized here in a similar scenario in 2017 and stabilized on Risperdal. She briefly followed up with [facility] until her insurance has changed. Basically, off medications for 2 years. No suicide attempts.  Family psychiatric history. None reported.  Social history. History of intellectual disability. Lives with her mother. She has never been married, no children.  Total Time spent with patient: 1 hour  Is the patient at risk to self? No.  Has the patient been a risk to self in the past 6 months? No.  Has the patient been a risk to self within the distant past?  No.  Is the patient a risk to others? No.  Has the patient been a risk to others in the past 6 months? No.  Has the patient been a risk to others within the distant past? No.   Prior Inpatient Therapy:   Prior Outpatient Therapy:    Alcohol Screening: 1. How often do you have a drink containing alcohol?: Never 2. How many drinks containing alcohol do you have on a typical day when you are drinking?: 1 or 2 3. How often do you have six or more drinks on one occasion?: Never AUDIT-C Score: 0 4. How often during the last year have you found that you were not able to stop drinking once you had started?: Never 5. How often during the last year have you failed to do what was normally expected from you becasue of drinking?: Never 6. How often during the last year have you needed a first drink in the morning to get yourself going after a heavy drinking session?: Never 7. How often during the last year have you had a feeling of guilt of remorse after drinking?: Never 8. How often during the last year have you been unable to remember what happened the night before because you had been drinking?: Never 9. Have you or someone else been injured as a result of your drinking?: No 10. Has a relative or friend or a doctor or another health worker been concerned about your drinking or suggested you cut down?: No Alcohol Use Disorder Identification Test Final Score (AUDIT): 0 Intervention/Follow-up: AUDIT Score <7 follow-up not indicated Substance Abuse History in the last 12 months:  No. Consequences of Substance Abuse: NA Previous Psychotropic  Medications: Yes  Psychological Evaluations: No  Past Medical History:  Past Medical History:  Diagnosis Date  . Abnormal vaginal bleeding   . Breast cancer ([place]) 01/26/2015   Oncoltype score: 3. 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 Depression   . Edema  of both legs   . Fibroids   . GERD (gastroesophageal reflux disease)   . High triglycerides   . Hypertension    RECENTLY TAKEN OFF OF MEDS PER MOM   . Learning disability   . Migraine    daily  . Personal history of radiation therapy   . Seizures ([place])    AS A CHILD-NONE SINCE    Past Surgical History:  Procedure Laterality Date  . ABDOMINAL HYSTERECTOMY    . AXILLARY HIDRADENITIS EXCISION     unc  . AXILLARY SENTINEL NODE BIOPSY Right 02/23/2015   Sentinel lymph node biopsy x4, one with isolated tumor cells.;  Dr Jamal Collin, MD;  [facility] ORS;  ISOLATED TUMOR CELLS IN ONE LYMPH NODE (1/1).  [place] Kitchen BREAST BIOPSY Right 01/15/2016   stereo Dr Bary Castilla, fat necrosis  . BREAST DUCTAL SYSTEM EXCISION Bilateral 01/26/2015   Bilateral retroareolar ductal excision for bloody nipple drainage, DCIS identified on the right  . BREAST LUMPECTOMY Right 02/06/2015   Invasive mammary carcinoma.  5 mm, DCIS at margin.  Surgeon: Christene Lye, MD;  Location: [facility] ORS;  Service: General;  Laterality: Right;  . CARPAL TUNNEL RELEASE    . CARPAL TUNNEL RELEASE Right 05/08/2016   Procedure: OPEN CARPAL TUNNEL RELEASE;  Surgeon: Corky Mull, MD;  Location: [facility];  Service: Orthopedics;  Laterality: Right;  . CYSTOSCOPY N/A 11/24/2017   Procedure: CYSTOSCOPY;  Surgeon: Benjaman Kindler, MD;  Location: [facility] ORS;  Service: Gynecology;  Laterality: N/A;  . EXCISION / BIOPSY BREAST / NIPPLE / DUCT Bilateral 01/26/2015   left breast negative. right breast positive  . INGUINAL HIDRADENITIS EXCISION    . LAPAROSCOPIC BILATERAL SALPINGO OOPHERECTOMY Bilateral 11/24/2017   Procedure: LAPAROSCOPIC BILATERAL SALPINGO OOPHORECTOMY;  Surgeon: Benjaman Kindler, MD;  Location: [facility] ORS;  Service: Gynecology;  Laterality: Bilateral;  . LAPAROSCOPIC HYSTERECTOMY N/A 11/24/2017   Procedure: HYSTERECTOMY TOTAL LAPAROSCOPIC;  Surgeon: Benjaman Kindler, MD;  Location: [facility] ORS;  Service: Gynecology;  Laterality: N/A;    Family History:  Family History  Problem Relation Age of Onset  . Breast cancer Sister 41  . Testicular cancer Father     Tobacco Screening: Have you used any form of tobacco in the last 30 days? (Cigarettes, Smokeless Tobacco, Cigars, and/or Pipes): No Social History:  Social History   Substance and Sexual Activity  Alcohol Use No  . Alcohol/week: 0.0 standard drinks     Social History   Substance and Sexual Activity  Drug Use No    Additional Social History:                           Allergies:   Allergies  Allergen Reactions  . Sumatriptan Shortness Of Breath  . Cephalexin Nausea And Vomiting  . Chocolate Swelling  . Corn Oil Other (See Comments)    By test. Pt has eaten without issues.  . Peanut Oil Swelling and Other (See Comments)    throat  . Peanut-Containing Drug Products Swelling  . Phenobarbital Other (See Comments)    Hyperactivity, aggitation  . Vilazodone Hcl Nausea Only  .  Duloxetine Hcl Anxiety and Other (See Comments)    agitation  . Latex Rash and Other (See Comments)    Bandaids, gloves(when worn)  . Sulfa Antibiotics Rash  . Tape Rash   Lab Results:  Results for orders placed or performed during the hospital encounter of 06/12/18 (from the past 48 hour(s))  Hemoglobin A1c     Status: None   Collection Time: 06/12/18  7:02 AM  Result Value Ref Range   Hgb A1c MFr Bld 5.1 4.8 - 5.6 %    Comment: (NOTE) Pre diabetes:          5.7%-6.4% Diabetes:              >6.4% Glycemic control for   <7.0% adults with diabetes    Mean Plasma Glucose 99.67 mg/dL    Comment: Performed at [facility] [address] [place], [place] [address]  Lipid panel     Status: Abnormal   Collection Time: 06/12/18  7:02 AM  Result Value Ref Range   Cholesterol 175 0 - 200 mg/dL   Triglycerides 270 (H) <150 mg/dL   HDL 31 (L) >40 mg/dL   Total CHOL/HDL Ratio 5.6 RATIO   VLDL 54 (H) 0 - 40 mg/dL   LDL Cholesterol 90 0 - 99 mg/dL    Comment:         Total Cholesterol/HDL:CHD Risk Coronary Heart Disease Risk Table                     Men   Women  1/2 Average Risk   3.4   3.3  Average Risk       5.0   4.4  2 X Average Risk   9.6   7.1  3 X Average Risk  23.4   11.0        Use the calculated Patient Ratio above and the CHD Risk Table to determine the patient's CHD Risk.        ATP III CLASSIFICATION (LDL):  <100     mg/dL   Optimal  100-129  mg/dL   Near or Above                    Optimal  130-159  mg/dL   Borderline  160-189  mg/dL   High  >190     mg/dL   Very High Performed at [facility], [address] [place], [place] [address]   TSH     Status: None   Collection Time: 06/12/18  7:02 AM  Result Value Ref Range   TSH 1.721 0.350 - 4.500 uIU/mL    Comment: Performed by a 3rd Generation assay with a functional sensitivity of <=0.01 uIU/mL. Performed at [facility], [address] [place], [place] [address]     Blood Alcohol level:  Lab Results  Component Value Date   [facility] <10 06/11/2018   ETH <5 49/82/6415    Metabolic Disorder Labs:  Lab Results  Component Value Date   HGBA1C 5.1 06/12/2018   MPG 99.67 06/12/2018   MPG 100 03/13/2016   No results found for: PROLACTIN Lab Results  Component Value Date   CHOL 175 06/12/2018   TRIG 270 (H) 06/12/2018   HDL 31 (L) 06/12/2018   CHOLHDL 5.6 06/12/2018   VLDL 54 (H) 06/12/2018   LDLCALC 90 06/12/2018   LDLCALC 109 (H) 03/13/2016    Current Medications: Current Facility-Administered Medications  Medication Dose Route Frequency Provider  Last Rate Last Dose  . acetaminophen (TYLENOL) tablet 650 mg  650 mg Oral Q6H PRN Clapacs, John T, MD      . alum & mag hydroxide-simeth (MAALOX/MYLANTA) 200-200-20 MG/5ML suspension 30 mL  30 mL Oral Q4H PRN Clapacs, Madie Reno, MD      . Derrill Memo ON 06/13/2018] ARIPiprazole (ABILIFY) tablet 20 mg  20 mg Oral Daily ,  B, MD      . aspirin-acetaminophen-caffeine (EXCEDRIN MIGRAINE)  per tablet 1 tablet  1 tablet Oral Q6H PRN Clapacs, John T, MD      . docusate sodium (COLACE) capsule 100 mg  100 mg Oral BID Clapacs, Madie Reno, MD   100 mg at 06/12/18 0819  . fosfomycin (MONUROL) packet 3 g  3 g Oral Once ,  B, MD      . losartan (COZAAR) tablet 50 mg  50 mg Oral Daily Clapacs, John T, MD   50 mg at 06/12/18 1975   And  . hydrochlorothiazide (MICROZIDE) capsule 12.5 mg  12.5 mg Oral Daily Clapacs, John T, MD   12.5 mg at 06/12/18 8832  . hydrOXYzine (ATARAX/VISTARIL) tablet 50 mg  50 mg Oral TID PRN Clapacs, John T, MD      . loratadine (CLARITIN) tablet 10 mg  10 mg Oral Daily Clapacs, Madie Reno, MD   10 mg at 06/12/18 0818  . magnesium hydroxide (MILK OF MAGNESIA) suspension 30 mL  30 mL Oral Daily PRN Clapacs, John T, MD      . sertraline (ZOLOFT) tablet 100 mg  100 mg Oral Daily Clapacs, Madie Reno, MD   100 mg at 06/12/18 0819  . temazepam (RESTORIL) capsule 30 mg  30 mg Oral QHS ,  B, MD      . traZODone (DESYREL) tablet 100 mg  100 mg Oral QHS PRN Clapacs, Madie Reno, MD       PTA Medications: Medications Prior to Admission  Medication Sig Dispense Refill Last Dose  . aspirin-acetaminophen-caffeine (EXCEDRIN MIGRAINE) 250-250-65 MG per tablet Take 1 tablet by mouth every 6 (six) hours as needed for headache.    Past Week at Unknown time  . cetirizine (ZYRTEC) 10 MG tablet Take 10 mg by mouth daily as needed for allergies.    Past Week at Unknown time  . docusate sodium (COLACE) 100 MG capsule Take 1 capsule (100 mg total) by mouth 2 (two) times daily. To keep stools soft 30 capsule 0   . gabapentin (NEURONTIN) 800 MG tablet Take 1 tablet (800 mg total) by mouth at bedtime for 14 days. Take nightly for 3 days, then up to 14 days as needed 14 tablet 0   . ibuprofen (ADVIL,MOTRIN) 800 MG tablet Take 1 tablet (800 mg total) by mouth every 8 (eight) hours as needed for moderate pain. 30 tablet 1   . Icosapent Ethyl (VASCEPA) 1 g CAPS Take 2 g by mouth  daily with lunch.   11/23/2017 at Unknown time  . losartan-hydrochlorothiazide (HYZAAR) 50-12.5 MG tablet Take 1 tablet by mouth daily.    11/23/2017 at Unknown time  . oxycodone (OXY-IR) 5 MG capsule Take 1 capsule (5 mg total) by mouth every 6 (six) hours as needed for pain. 15 capsule 0   . polyethylene glycol (MIRALAX) packet Take 17 g by mouth daily. (Patient taking differently: Take 17 g by mouth daily as needed for moderate constipation. ) 14 each 0 11/21/2017  . sertraline (ZOLOFT) 100 MG tablet Take 1 tablet (100 mg total) by  mouth daily. 30 tablet 1 11/23/2017 at Unknown time  . tamoxifen (NOLVADEX) 20 MG tablet TAKE 1 TABLET BY MOUTH DAILY 30 tablet 11 11/23/2017 at Unknown time    Musculoskeletal: Strength & Muscle Tone: within normal limits Gait & Station: normal Patient leans: N/A  Psychiatric Specialty Exam: I reviewed physical exam performed in the ER and agree with the findings. Physical Exam  Nursing note and vitals reviewed. Psychiatric: Her mood appears anxious. Her affect is blunt. Her speech is delayed. She is withdrawn. Thought content is paranoid and delusional. Cognition and memory are impaired. She expresses impulsivity. She exhibits a depressed mood.    Review of Systems  Neurological: Positive for headaches.  Psychiatric/Behavioral: Positive for depression. The patient is nervous/anxious and has insomnia.   All other systems reviewed and are negative.   Blood pressure (!) 138/91, pulse 93, temperature 97.9 F (36.6 C), temperature source Oral, resp. rate 18, height 5' 2" (1.575 m), weight 80.7 kg, last menstrual period 11/10/2017, SpO2 97 %.Body mass index is 32.56 kg/m.  See SRA                                                  Sleep:  Number of Hours: 4.25    Treatment Plan Summary: Daily contact with patient to assess and evaluate symptoms and progress in treatment and Medication management   Karen Beard is a 47 year old female with a  history of schizpohrenia admitted floridly psychotic in the context of medication noncompliance for two years.  #Psychosis -Abilify 20 mg daily -Zoloft 100 mg daily -Restoril 30 mg nightly  #HTN -continue antihypertensives  #Mograine headaches -Excedrin PRN available  #UTI -will give fosfomycin  #Labs -lipid panel, TSH, A1C -EKG -pregnancy test  #Disposition -discharge to home with the mother -follow up with ACT team   Observation Level/Precautions:  15 minute checks  Laboratory:  CBC Chemistry Profile UDS UA  Psychotherapy:    Medications:    Consultations:    Discharge Concerns:    Estimated LOS:  Other:     Physician Treatment Plan for Primary Diagnosis: Undifferentiated schizophrenia ([place]) Long Term Goal(s): Improvement in symptoms so as ready for discharge  Short Term Goals: Ability to identify changes in lifestyle to reduce recurrence of condition will improve, Ability to verbalize feelings will improve, Ability to disclose and discuss suicidal ideas, Ability to demonstrate self-control will improve, Ability to identify and develop effective coping behaviors will improve, Ability to maintain clinical measurements within normal limits will improve, Compliance with prescribed medications will improve and Ability to identify triggers associated with substance abuse/mental health issues will improve  Physician Treatment Plan for Secondary Diagnosis: Principal Problem:   Undifferentiated schizophrenia ([place]) Active Problems:   Migraine headache   HTN (hypertension)   Mild intellectual disability  Long Term Goal(s): Improvement in symptoms so as ready for discharge  Short Term Goals: NA  I certify that inpatient services furnished can reasonably be expected to improve the patient's condition.    Orson Slick, MD 12/27/20192:41 PM

## 2018-06-12 NOTE — Plan of Care (Signed)
Patient newly admitted, hasn't had time to progress  Problem: Activity: Goal: Will verbalize the importance of balancing activity with adequate rest periods Outcome: Not Progressing   Problem: Education: Goal: Will be free of psychotic symptoms Outcome: Not Progressing Goal: Knowledge of the prescribed therapeutic regimen will improve Outcome: Not Progressing   Problem: Safety: Goal: Ability to redirect hostility and anger into socially appropriate behaviors will improve Outcome: Not Progressing Goal: Ability to remain free from injury will improve Outcome: Not Progressing   Problem: Education: Goal: Knowledge of [facility] Education information/materials will improve Outcome: Not Progressing Goal: Emotional status will improve Outcome: Not Progressing Goal: Mental status will improve Outcome: Not Progressing  Goal: Verbalization of understanding the information provided will improve Outcome: Not Progressing   Problem: Safety: Goal: Periods of time without injury will increase Outcome: Not Progressing   Problem: Education: Goal: Utilization of techniques to improve thought processes will improve Outcome: Not Progressing Goal: Knowledge of the prescribed therapeutic regimen will improve Outcome: Not Progressing   Problem: Safety: Goal: Ability to disclose and discuss suicidal ideas will improve Outcome: Not Progressing Goal: Ability to identify and utilize support systems that promote safety will improve Outcome: Not Progressing

## 2018-06-12 NOTE — Plan of Care (Signed)
Pt loc x 4  and pleasant. Pt compliance with her medication this morning. Participated in group this morning and denies, pain and SI/SIB with flat affect. Pt stated she couldn't sleep well this during the night and hope to sleep better tonight. Pt is encourage to ask for her trazadone for sleep if needed. Pt took one time dose of Fosfomycin which was mixed in water but ppt immediately vomited the whole medication. Currently resting in bed. Will continue to monitor.

## 2018-06-12 NOTE — Tx Team (Signed)
Initial Treatment Plan 06/12/2018 1:39 AM Karen Beard MCR:[id number]    PATIENT STRESSORS: Financial difficulties Health problems   PATIENT STRENGTHS: Motivation for treatment/growth Supportive family/friends   PATIENT IDENTIFIED PROBLEMS: Psychosis   Depression  Anxiety                 DISCHARGE CRITERIA:  Improved stabilization in mood, thinking, and/or behavior Motivation to continue treatment in a less acute level of care  PRELIMINARY DISCHARGE PLAN: Outpatient therapy Return to previous living arrangement  PATIENT/FAMILY INVOLVEMENT: This treatment plan has been presented to and reviewed with the patient, Karen Beard.  The patient has been given the opportunity to ask questions and make suggestions.  Mallie Darting, RN 06/12/2018, 1:39 AM
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
## 2018-06-12 NOTE — Progress Notes (Signed)
Patient ID: Karen Beard, female   DOB: 14-Dec-1970, 47 y.o.   MRN: [id number] PER STATE REGULATIONS 482.30  THIS CHART WAS REVIEWED FOR MEDICAL NECESSITY WITH RESPECT TO THE PATIENT'S ADMISSION/DURATION OF STAY.  NEXT REVIEW DATE:06/16/18  Roma Schanz, RN, BSN CASE MANAGER

## 2018-06-12 NOTE — BHH Suicide Risk Assessment (Signed)
[facility] Admission Suicide Risk Assessment   Nursing information obtained from:  Patient Demographic factors:  NA Current Mental Status:  NA Loss Factors:  NA Historical Factors:  NA Risk Reduction Factors:  Living with another person, especially a relative  Total Time spent with patient: 1 hour Principal Problem: Undifferentiated schizophrenia ([place]) Diagnosis:  Principal Problem:   Undifferentiated schizophrenia ([place]) Active Problems:   Migraine headache   HTN (hypertension)  Subjective Data: psychotic break  Continued Clinical Symptoms:  Alcohol Use Disorder Identification Test Final Score (AUDIT): 0 The "Alcohol Use Disorders Identification Test", Guidelines for Use in Primary Care, Second Edition.  World Pharmacologist [facility]). Score between 0-7:  no or low risk or alcohol related problems. Score between 8-15:  moderate risk of alcohol related problems. Score between 16-19:  high risk of alcohol related problems. Score 20 or above:  warrants further diagnostic evaluation for alcohol dependence and treatment.   CLINICAL FACTORS:   Schizophrenia:   Depressive state Paranoid or undifferentiated type   Musculoskeletal: Strength & Muscle Tone: within normal limits Gait & Station: normal Patient leans: N/A  Psychiatric Specialty Exam: Physical Exam  Nursing note and vitals reviewed. Psychiatric: Her speech is normal. Her mood appears anxious. Her affect is blunt. She is withdrawn. Thought content is paranoid and delusional. Cognition and memory are impaired. She expresses impulsivity. She exhibits a depressed mood.    Review of Systems  Neurological: Negative.   Psychiatric/Behavioral: Negative.   All other systems reviewed and are negative.   Blood pressure (!) 138/91, pulse 93, temperature 97.9 F (36.6 C), temperature source Oral, resp. rate 18, height 5\' 2"  (1.575 m), weight 80.7 kg, last menstrual period 11/10/2017, SpO2 97 %.Body mass index is 32.56 kg/m.   General Appearance: Fairly Groomed  Eye Contact:  Fair  Speech:  Slow  Volume:  Decreased  Mood:  Anxious and Depressed  Affect:  Blunt  Thought Process:  Irrelevant  Orientation:  Full (Time, Place, and Person)  Thought Content:  Illogical, Delusions and Paranoid Ideation  Suicidal Thoughts:  No  Homicidal Thoughts:  No  Memory:  Immediate;   Poor Recent;   Poor Remote;   Poor  Judgement:  Poor  Insight:  Lacking  Psychomotor Activity:  Decreased  Concentration:  Concentration: Fair and Attention Span: Fair  Recall:  [employer] of Knowledge:  Fair  Language:  Fair  Akathisia:  No  Handed:  Right  AIMS (if indicated):     Assets:  Communication Skills Desire for Improvement Financial Resources/Insurance Housing Physical Health Resilience Social Support  ADL's:  Intact  Cognition:  Impaired,  Mild  Sleep:  Number of Hours: 4.25      COGNITIVE FEATURES THAT CONTRIBUTE TO RISK:  None    SUICIDE RISK:   Moderate:  Frequent suicidal ideation with limited intensity, and duration, some specificity in terms of plans, no associated intent, good self-control, limited dysphoria/symptomatology, some risk factors present, and identifiable protective factors, including available and accessible social support.  PLAN OF CARE: hospital admission, medication management, discharge planning.  Ms. Mundo is a 47 year old female with a history of schizpohrenia admitted floridly psychotic in the context of medication noncompliance for two years.  #Psychosis -Abilify 20 mg daily -Zoloft 100 mg daily -Restoril 30 mg nightly  #HTN -continue antihypertensives  #Mograine headaches -Excedrin PRN available  #UTI -will give fosfomycin  #Labs -lipid panel, TSH, A1C -EKG -pregnancy test  #Disposition -discharge to home with the mother -follow up with ACT team  I certify that inpatient services furnished can reasonably be expected to improve the patient's condition.    Orson Slick, MD 06/12/2018, 1:36 PM

## 2018-06-12 NOTE — ED Notes (Signed)
Patient is to be admitted to [facility] by Dr. Weber Cooks.  Attending Physician will be Dr. Bary Leriche.   Patient has been assigned to room 306, by [place] Nurse Finzel.   Intake Paper Work has been signed and placed on patient chart.  ER staff is aware of the admission:  ER Secretary     ER MD   [place] Patient's Nurse   Sunday Spillers Patient Access.

## 2018-06-13 MED ORDER — ARIPIPRAZOLE ER 400 MG IM SRER
400.0000 mg | INTRAMUSCULAR | Status: DC
  Administered 2018-06-13: 400 mg via INTRAMUSCULAR
  Filled 2018-06-13: qty 2

## 2018-06-13 MED ORDER — TOPIRAMATE 100 MG PO TABS
100.0000 mg | ORAL_TABLET | Freq: Every day | ORAL | Status: DC
  Administered 2018-06-13 – 2018-06-15 (×3): 100 mg via ORAL
  Filled 2018-06-13 (×3): qty 1

## 2018-06-13 MED ORDER — TEMAZEPAM 7.5 MG PO CAPS
7.5000 mg | ORAL_CAPSULE | Freq: Every day | ORAL | Status: DC
  Administered 2018-06-13: 7.5 mg via ORAL
  Filled 2018-06-13: qty 1

## 2018-06-13 NOTE — Plan of Care (Signed)
Patient is alert and oriented X 4, patient complained of nausea this morning but states she feels better now. Denies SI, and HI. Patient states today not hearing voices or seeing anything today. Patient is compliant with medications. Patient is interacting with peers on the milieu. Patient does not seem as fearful as yesterday. Patient is eating, and sleeping well. Rates pain 0/10. No self injurious behaviors observed. Safety checks Q 15 minutes to continue. Problem: Activity: Goal: Will verbalize the importance of balancing activity with adequate rest periods Outcome: Progressing   Problem: Education: Goal: Will be free of psychotic symptoms Outcome: Progressing Goal: Knowledge of the prescribed therapeutic regimen will improve Outcome: Progressing   Problem: Safety: Goal: Ability to redirect hostility and anger into socially appropriate behaviors will improve Outcome: Progressing Goal: Ability to remain free from injury will improve Outcome: Progressing   Problem: Education: Goal: Knowledge of [facility] Education information/materials will improve Outcome: Progressing Goal: Emotional status will improve Outcome: Progressing Goal: Mental status will improve Outcome: Progressing Goal: Verbalization of understanding the information provided will improve Outcome: Progressing

## 2018-06-13 NOTE — BHH Group Notes (Addendum)
LCSW Group Therapy Note  06/13/2018 1:15pm  Type of Therapy and Topic:  Group Therapy:  Cognitive Distortions  Participation Level:  Active   Description of Group:    Patients in this group will be introduced to the topic of cognitive distortions.  Patients will identify and describe cognitive distortions, describe the feelings these distortions create for them.  Patients will identify one or more situations in their personal life where they have cognitively distorted thinking and will verbalize challenging this cognitive distortion through positive thinking skills.  Patients will practice the skill of using positive affirmations to challenge cognitive distortions using affirmation cards.    Therapeutic Goals:  1. Patient will identify two or more cognitive distortions they have used 2. Patient will identify one or more emotions that stem from use of a cognitive distortion 3. Patient will demonstrate use of a positive affirmation to counter a cognitive distortion through discussion and/or role play. 4. Patient will describe one way cognitive distortions can be detrimental to wellness   Summary of Patient Progress: The patient reported that she "feels great." Patients were introduced to the topic of cognitive distortions. The patient was able to identify and describe cognitive distortions, described the feelings these distortions create for her. Patient identified a situation in his/her personal life where she has cognitively distorted thinking and was able to verbalize and challenged this cognitive distortion through positive thinking skills. Patient was able to provide support and validation to other group members.    Therapeutic Modalities:   Cognitive Behavioral Therapy Motivational Interviewing   Georges Victorio  CUEBAS-COLON, LCSW 06/13/2018 11:41 AM

## 2018-06-13 NOTE — Progress Notes (Signed)
Patient received urine cup for pregnancy testing. Patient gave RN a sample but was not enough. Patient will try again after fluids.

## 2018-06-13 NOTE — Progress Notes (Signed)
Patient alert and oriented x 4, no distress noted affect is flat but,brightens upon approach, patient thoughts are organized and coherent, she appears less anxious, denies nausea or discomfort, 15 minutes safety checks maintained will continue to monitor.

## 2018-06-14 LAB — PREGNANCY, URINE: Preg Test, Ur: NEGATIVE

## 2018-06-14 MED ORDER — TRAZODONE HCL 100 MG PO TABS
100.0000 mg | ORAL_TABLET | Freq: Every day | ORAL | Status: DC
  Administered 2018-06-14: 100 mg via ORAL
  Filled 2018-06-14: qty 1

## 2018-06-14 MED ORDER — TRAZODONE HCL 100 MG PO TABS: 100.0000 mg | ORAL_TABLET | Freq: Every day | ORAL | 1 refills | Status: DC

## 2018-06-14 MED ORDER — SERTRALINE HCL 100 MG PO TABS: 100.0000 mg | ORAL_TABLET | Freq: Every day | ORAL | 1 refills | Status: DC

## 2018-06-14 MED ORDER — ARIPIPRAZOLE 20 MG PO TABS: 20.0000 mg | ORAL_TABLET | Freq: Every day | ORAL | 0 refills | Status: DC

## 2018-06-14 MED ORDER — ARIPIPRAZOLE ER 400 MG IM SRER: 400.0000 mg | INTRAMUSCULAR | 1 refills | Status: DC

## 2018-06-14 MED ORDER — TOPIRAMATE 100 MG PO TABS: 100.0000 mg | ORAL_TABLET | Freq: Every day | ORAL | 1 refills | Status: DC

## 2018-06-14 NOTE — BHH Suicide Risk Assessment (Signed)
[facility] Discharge Suicide Risk Assessment   Principal Problem: Undifferentiated schizophrenia [facility]) Discharge Diagnoses: Principal Problem:   Undifferentiated schizophrenia ([place]) Active Problems:   Migraine headache   HTN (hypertension)   Mild intellectual disability   Total Time spent with patient: 20 minutes  Musculoskeletal: Strength & Muscle Tone: within normal limits Gait & Station: normal Patient leans: N/A  Psychiatric Specialty Exam: Review of Systems  Neurological: Positive for headaches.  Psychiatric/Behavioral: Negative.   All other systems reviewed and are negative.   Blood pressure (!) 125/95, pulse (!) 102, temperature 98.7 F (37.1 C), temperature source Oral, resp. rate 18, height 5\' 2"  (1.575 m), weight 80.7 kg, last menstrual period 11/10/2017, SpO2 100 %.Body mass index is 32.56 kg/m.  General Appearance: Casual  Eye Contact::  Absent  Speech:  Clear and Coherent409  Volume:  Normal  Mood:  Euthymic  Affect:  Appropriate  Thought Process:  Goal Directed and Descriptions of Associations: Intact  Orientation:  Full (Time, Place, and Person)  Thought Content:  WDL  Suicidal Thoughts:  No  Homicidal Thoughts:  No  Memory:  Immediate;   Fair Recent;   Fair Remote;   Fair  Judgement:  Impaired  Insight:  Shallow  Psychomotor Activity:  Normal  Concentration:  Fair  Recall:  [place]  Language: Fair  Akathisia:  No  Handed:  Right  AIMS (if indicated):     Assets:  Communication Skills Desire for Improvement Financial Resources/Insurance Housing Physical Health Resilience Social Support  Sleep:  Number of Hours: 8.25  Cognition: WNL  ADL's:  Intact   Mental Status Per Nursing Assessment::   On Admission:  NA  Demographic Factors:  Adolescent or young adult and Caucasian  Loss Factors: NA  Historical Factors: Impulsivity  Risk Reduction Factors:   Sense of responsibility to family, Living with another person,  especially a relative and Positive social support  Continued Clinical Symptoms:  Schizophrenia:   Depressive state Less than 77 years old Paranoid or undifferentiated type  Cognitive Features That Contribute To Risk:  None    Suicide Risk:  Minimal: No identifiable suicidal ideation.  Patients presenting with no risk factors but with morbid ruminations; may be classified as minimal risk based on the severity of the depressive symptoms    Plan Of Care/Follow-up recommendations:  Activity:  as tolerated Diet:  low sodium hearth healthy Other:  keep follow up appointments  Orson Slick, MD 06/15/2018, 9:16 AM

## 2018-06-14 NOTE — Progress Notes (Signed)
D: Pt denies SI/HI/AVH, contracts for safety. Pt is pleasant and cooperative, engages appropriately. Pt. Affect is cheerful and somewhat animated. No observations of responding to internal stimuli. Pt. Goes to bed early this evening after snack and group. Pt. Reports mood is normal.   A: Q x 15 minute observation checks were completed for safety. Patient was provided with education, but needs reinforcement.  Patient was given/offered medications per orders. Patient  was encourage to attend groups, participate in unit activities and continue with plan of care. Pt. Chart and plans of care reviewed. Pt. Given support and encouragement.   R: Patient is complaint with medication and unit procedures. Pt. Attends snack time and groups appropriately. No behavioral concerns to report. Pt. Given additional UA sampling cup for pregnancy testing, but not able to be compliant with giving sufficient sampling. Pt. Educated on need for ecg. Will attempt to complete ecg testing in morning due to patient going to sleep early this evening.             Precautionary checks every 15 minutes for safety maintained, room free of safety hazards, patient sustains no injury or falls during this shift. Will endorse care to next shift.

## 2018-06-14 NOTE — Plan of Care (Signed)
Data: Patient is appropriate and cooperative to assessment. Patient denies SI/HI/AVH. Patient has completed daily self inventory worksheet. Patient has a pain rating of 0/10. Patient reports good sleep quality, appetite is fair for the last 23 hours. Patient rates depression "0/10" , feelings of hopelessness "0/10" and anxiety "0/10" Patients goal for today is "my depression."  Action:  Q x 15 minute observation checks were completed for safety. Patient was provided with education on medications. Patient was offered support and encouragement. Patient was given scheduled medications. Patient  was encourage to attend groups, participate in unit activities and continue with plan of care.    Response: Patient is adherent with scheduled medications. Patient has no complaints at this time. Patient is receptive to treatment and safety maintained on unit.    Problem: Activity: Goal: Will verbalize the importance of balancing activity with adequate rest periods Outcome: Progressing   Problem: Education: Goal: Knowledge of the prescribed therapeutic regimen will improve Outcome: Progressing   Problem: Safety: Goal: Ability to remain free from injury will improve Outcome: Progressing

## 2018-06-14 NOTE — BHH Group Notes (Signed)
LCSW Group Therapy Note 06/14/2018 1:15pm  Type of Therapy and Topic: Group Therapy: Feelings Around Returning Home & Establishing a Supportive Framework and Supporting Oneself When Supports Not Available  Participation Level: Active  Description of Group:  Patients first processed thoughts and feelings about upcoming discharge. These included fears of upcoming changes, lack of change, new living environments, judgements and expectations from others and overall stigma of mental health issues. The group then discussed the definition of a supportive framework, what that looks and feels like, and how do to discern it from an unhealthy non-supportive network. The group identified different types of supports as well as what to do when your family/friends are less than helpful or unavailable  Therapeutic Goals  1. Patient will identify one healthy supportive network that they can use at discharge. 2. Patient will identify one factor of a supportive framework and how to tell it from an unhealthy network. 3. Patient able to identify one coping skill to use when they do not have positive supports from others. 4. Patient will demonstrate ability to communicate their needs through discussion and/or role plays.  Summary of Patient Progress:  The patient reported she feels "great." Pt engaged during group session. As patients processed their anxiety about discharge and described healthy supports patient shared she is ready to be discharged. She states that she is better.  Patients identified at least one self-care tool they were willing to use after discharge.   Therapeutic Modalities Cognitive Behavioral Therapy Motivational Interviewing   Karen Beard  CUEBAS-COLON, LCSW 06/14/2018 8:53 AM

## 2018-06-14 NOTE — Plan of Care (Signed)
Pt. Denies si/hi/avh, contracts for safety. Pt. Reports she is able to remain safe while on the unit. No observations of responding to internal stimuli. Pt. Reports improvement in her emotional and mental status. Pt. Reports normal mood.    Problem: Education: Goal: Will be free of psychotic symptoms Outcome: Progressing   Problem: Safety: Goal: Ability to remain free from injury will improve Outcome: Progressing   Problem: Education: Goal: Emotional status will improve Outcome: Progressing Goal: Mental status will improve Outcome: Progressing   Problem: Safety: Goal: Periods of time without injury will increase Outcome: Progressing   Problem: Safety: Goal: Ability to disclose and discuss suicidal ideas will improve Outcome: Progressing

## 2018-06-14 NOTE — Plan of Care (Signed)
Pt. Denies si/hi/avh, contracts for safety. No observations of responding to internal stimuli or acting bizarre. Pt. Reports she can remain safe while on the unit. Pt. Reports improvement in emotional status.    Problem: Education: Goal: Will be free of psychotic symptoms Outcome: Progressing   Problem: Safety: Goal: Ability to remain free from injury will improve Outcome: Progressing   Problem: Education: Goal: Emotional status will improve Outcome: Progressing   Problem: Safety: Goal: Periods of time without injury will increase Outcome: Progressing   Problem: Safety: Goal: Ability to disclose and discuss suicidal ideas will improve Outcome: Progressing

## 2018-06-14 NOTE — Progress Notes (Signed)
Offered EKG to patient. Patient requested to complete it at a later time. Patient was reassured. Will attempt to obtain at a later time.

## 2018-06-14 NOTE — Discharge Summary (Addendum)
Physician Discharge Summary Note  Patient:  Karen Beard is an 47 y.o., female MRN:  [id number] DOB:  08-25-1970 Patient phone:  [phone] (home)  Patient address:   [address] [place] [id number],  Total Time spent with patient: 20 minutes plus 15 in on care coordination and documentation  Date of Admission:  06/12/2018 Date of Discharge: 06/15/2018  Reason for Admission:  Psychotic break.  History of Present Illness:   Identifying data. Karen Beard is a 47 year old female with a history of schizophrenia.  Chief complaint. "The man across the street made me sad."  History of present illness. Information was obtained from the patient and the chart. The patient was brought to the hospital by her mother for worsening of psychotic and depressive symptoms. The patient firmly believes that for the past two years she has been in witness protection having a personal guard, because her husband wants to kill her, has a listening device implanted in her mouth and has been under unbearable duress from police surveillance. The man who has been watching her from across the street has been particularly oppressive. She reports sadness, crying spells, poor sleep and decreased appetite with weight loss. She feels anxious all the time. She adamantly denies any psychotic symptoms. There are no substance involved.  Past psychiatric history. She was hospitalized here in a similar scenario in 2017 and stabilized on Risperdal. She briefly followed up with [facility] until her insurance has changed. Basically, off medications for 2 years. No suicide attempts.  Family psychiatric history. None reported.  Social history. History of intellectual disability. Lives with her mother. She has never been married, no children.  Principal Problem: Undifferentiated schizophrenia [facility]) Discharge Diagnoses: Principal Problem:   Undifferentiated schizophrenia ([place]) Active Problems:   Migraine  headache   HTN (hypertension)   Mild intellectual disability    Past Medical History:  Past Medical History:  Diagnosis Date  . Abnormal vaginal bleeding   . Breast cancer ([place]) 01/26/2015   Oncoltype score: 3. 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 Depression   . Edema of both legs   . Fibroids   . GERD (gastroesophageal reflux disease)   . High triglycerides   . Hypertension    RECENTLY TAKEN OFF OF MEDS PER MOM   . Learning disability   . Migraine    daily  . Personal history of radiation therapy   . Seizures ([place])    AS A CHILD-NONE SINCE    Past Surgical History:  Procedure Laterality Date  . ABDOMINAL HYSTERECTOMY    . AXILLARY HIDRADENITIS EXCISION     unc  . AXILLARY SENTINEL NODE BIOPSY Right 02/23/2015   Sentinel lymph node biopsy x4, one with isolated tumor cells.;  Dr Jamal Collin, MD;  [facility] ORS;  ISOLATED TUMOR CELLS IN ONE LYMPH NODE (1/1).  Marland Kitchen BREAST BIOPSY Right 01/15/2016   stereo Dr Bary Castilla, fat necrosis  . BREAST DUCTAL SYSTEM EXCISION Bilateral 01/26/2015   Bilateral retroareolar ductal excision for bloody nipple drainage, DCIS identified on the right  . BREAST LUMPECTOMY Right 02/06/2015   Invasive mammary carcinoma.  5 mm, DCIS at margin.  Surgeon: Christene Lye, MD;  Location: [facility] ORS;  Service: General;  Laterality: Right;  . CARPAL TUNNEL RELEASE    . CARPAL TUNNEL RELEASE Right 05/08/2016   Procedure: OPEN CARPAL TUNNEL RELEASE;  Surgeon: Corky Mull, MD;  Location: [facility];  Service: Orthopedics;  Laterality: Right;  . CYSTOSCOPY N/A 11/24/2017   Procedure: CYSTOSCOPY;  Surgeon: Benjaman Kindler, MD;  Location: [facility] ORS;  Service: Gynecology;  Laterality: N/A;  . EXCISION / BIOPSY BREAST / NIPPLE / DUCT Bilateral 01/26/2015   left breast negative. right breast positive  . INGUINAL HIDRADENITIS EXCISION    . LAPAROSCOPIC  BILATERAL SALPINGO OOPHERECTOMY Bilateral 11/24/2017   Procedure: LAPAROSCOPIC BILATERAL SALPINGO OOPHORECTOMY;  Surgeon: Benjaman Kindler, MD;  Location: [facility] ORS;  Service: Gynecology;  Laterality: Bilateral;  . LAPAROSCOPIC HYSTERECTOMY N/A 11/24/2017   Procedure: HYSTERECTOMY TOTAL LAPAROSCOPIC;  Surgeon: Benjaman Kindler, MD;  Location: [facility] ORS;  Service: Gynecology;  Laterality: N/A;   Family History:  Family History  Problem Relation Age of Onset  . Breast cancer Sister 52  . Testicular cancer Father    Social History:  Social History   Substance and Sexual Activity  Alcohol Use No  . Alcohol/week: 0.0 standard drinks     Social History   Substance and Sexual Activity  Drug Use No    Social History   Socioeconomic History  . Marital status: Single    Spouse name: Not on file  . Number of children: Not on file  . Years of education: Not on file  . Highest education level: Not on file  Occupational History  . Not on file  Social Needs  . Financial resource strain: Not on file  . Food insecurity:    Worry: Not on file    Inability: Not on file  . Transportation needs:    Medical: Not on file    Non-medical: Not on file  Tobacco Use  . Smoking status: Never Smoker  . Smokeless tobacco: Never Used  Substance and Sexual Activity  . Alcohol use: No    Alcohol/week: 0.0 standard drinks  . Drug use: No  . Sexual activity: Not on file  Lifestyle  . Physical activity:    Days per week: Not on file    Minutes per session: Not on file  . Stress: Not on file  Relationships  . Social connections:    Talks on phone: Not on file    Gets together: Not on file    Attends religious service: Not on file    Active member of club or organization: Not on file    Attends meetings of clubs or organizations: Not on file    Relationship status: Not on file  Other Topics Concern  . Not on file  Social History Narrative  . Not on file    Hospital Course:    Karen Beard  is a 47 year old female with a history of schizpohrenia admitted floridly psychotic in the context of medication noncompliance for two years. She was restarted on her medications, including injectable antipsychotic and tolerated the well. At the time of discharge, the patient is no longer suicidal r psychotic. She is able to contract for safety. She is forwardthinking and optimistic abou tthe future.  #Psychosis/depression -Abilify 20 mg daily for two weeks -Abilify maintena 400 mg monthly injections, ext dose on 07/10/2018 -Zoloft 100 mg daily -Restoril 30 mg nightly  #HTN -continue antihypertensives  #Mograine headaches -Excedrin PRN available -continue Topamax 100 mg daily  #UTI -received a single dose of fosfomycin  #Labs -lipid panel sows elevated TG, TSH and A1C are normal -EKG reviewed, sinus rhythm with QTc 455 -pregnancy test, hysterectomy in June 2019  #Disposition -discharge to home with the mother -follow up with psychiatrist in [place]  Physical Findings: AIMS:  , ,  ,  ,    CIWA:    COWS:     Musculoskeletal: Strength & Muscle Tone: within normal limits Gait & Station: normal Patient leans: N/A  Psychiatric Specialty Exam: Physical Exam  Nursing note and vitals reviewed. Psychiatric: She has a normal mood and affect. Her speech is normal and behavior is normal. Judgment and thought content normal. Cognition and memory are normal.    Review of Systems  Neurological: Positive for headaches.  Psychiatric/Behavioral: Negative.   All other systems reviewed and are negative.   Blood pressure (!) 125/95, pulse (!) 102, temperature 98.7 F (37.1 C), temperature source Oral, resp. rate 18, height _0  (1.575 m), weight 80.7 kg, last menstrual period 11/10/2017, SpO2 100 %.Body mass index is 32.56 kg/m.  General Appearance: Casual  Eye Contact:  Good  Speech:  Clear and Coherent  Volume:  Normal  Mood:  Euthymic  Affect:  Appropriate  Thought  Process:  Goal Directed and Descriptions of Associations: Intact  Orientation:  Full (Time, Place, and Person)  Thought Content:  WDL  Suicidal Thoughts:  No  Homicidal Thoughts:  No  Memory:  Immediate;   Fair Recent;   Fair Remote;   Fair  Judgement:  Impaired  Insight:  Shallow  Psychomotor Activity:  Normal  Concentration:  Concentration: Fair and Attention Span: Fair  Recall:  [employer] of Knowledge:  Fair  Language:  Fair  Akathisia:  No  Handed:  Right  AIMS (if indicated):     Assets:  Communication Skills Desire for Improvement Financial Resources/Insurance Housing Physical Health Resilience Social Support  ADL's:  Intact  Cognition:  WNL  Sleep:  Number of Hours: 8.25     Have you used any form of tobacco in the last 30 days? (Cigarettes, Smokeless Tobacco, Cigars, and/or Pipes): No  Has this patient used any form of tobacco in the last 30 days? (Cigarettes, Smokeless Tobacco, Cigars, and/or Pipes) Yes, No  Blood Alcohol level:  Lab Results  Component Value Date   ETH <10 06/11/2018   ETH <5 74/94/4967    Metabolic Disorder Labs:  Lab Results  Component Value Date   HGBA1C 5.1 06/12/2018   MPG 99.67 06/12/2018   MPG 100 03/13/2016   No results found for: PROLACTIN Lab Results  Component Value Date   CHOL 175 06/12/2018   TRIG 270 (H) 06/12/2018   HDL 31 (L) 06/12/2018   CHOLHDL 5.6 06/12/2018   VLDL 54 (H) 06/12/2018   LDLCALC 90 06/12/2018   LDLCALC 109 (H) 03/13/2016    See Psychiatric Specialty Exam and Suicide Risk Assessment completed by Attending Physician prior to discharge.  Discharge destination:  Home  Is patient on multiple antipsychotic therapies at discharge:  No   Has Patient had three or more failed trials of antipsychotic monotherapy by history:  No  Recommended Plan for Multiple Antipsychotic Therapies: NA  Discharge Instructions    Diet - low sodium heart healthy   Complete by:  As directed    Increase activity  slowly   Complete by:  As directed      Allergies as of 06/15/2018      Reactions   Sumatriptan Shortness Of Breath   Cephalexin Nausea And Vomiting   Chocolate Swelling   Corn Oil Other (See Comments)   By test. Pt has eaten without issues.   Peanut Oil Swelling, Other (See Comments)   throat   Peanut-containing Drug Products Swelling  Phenobarbital Other (See Comments)   Hyperactivity, aggitation   Vilazodone Hcl Nausea Only   Duloxetine Hcl Anxiety, Other (See Comments)   agitation   Latex Rash, Other (See Comments)   Bandaids, gloves(when worn)   Sulfa Antibiotics Rash   Tape Rash      Medication List    STOP taking these medications   gabapentin 800 MG tablet Commonly known as:  NEURONTIN   ibuprofen 800 MG tablet Commonly known as:  ADVIL,MOTRIN   oxycodone 5 MG capsule Commonly known as:  OXY-IR     TAKE these medications     Indication  ARIPiprazole 20 MG tablet Commonly known as:  ABILIFY Take 1 tablet (20 mg total) by mouth daily. For two weeks  Indication:  Schizophrenia   ARIPiprazole ER 400 MG Srer injection Commonly known as:  ABILIFY MAINTENA Inject 2 mLs (400 mg total) into the muscle every 28 (twenty-eight) days. Next injectio on 07/10/2018 Start taking on:  July 11, 2018  Indication:  Schizophrenia   aspirin-acetaminophen-caffeine 250-250-65 MG tablet Commonly known as:  EXCEDRIN MIGRAINE Take 1 tablet by mouth every 6 (six) hours as needed for headache.  Indication:  Headache   cetirizine 10 MG tablet Commonly known as:  ZYRTEC Take 10 mg by mouth daily as needed for allergies.  Indication:  Hayfever   docusate sodium 100 MG capsule Commonly known as:  COLACE Take 1 capsule (100 mg total) by mouth 2 (two) times daily. To keep stools soft  Indication:  Constipation   losartan-hydrochlorothiazide 50-12.5 MG tablet Commonly known as:  HYZAAR Take 1 tablet by mouth daily.  Indication:  High Blood Pressure Disorder   polyethylene  glycol packet Commonly known as:  MIRALAX Take 17 g by mouth daily. What changed:    when to take this  reasons to take this  Indication:  Constipation   sertraline 100 MG tablet Commonly known as:  ZOLOFT Take 1 tablet (100 mg total) by mouth daily.  Indication:  Major Depressive Disorder   tamoxifen 20 MG tablet Commonly known as:  NOLVADEX TAKE 1 TABLET BY MOUTH DAILY  Indication:  Stimulation of Ovarian Function   topiramate 100 MG tablet Commonly known as:  TOPAMAX Take 1 tablet (100 mg total) by mouth daily.  Indication:  Cluster Headache   traZODone 100 MG tablet Commonly known as:  DESYREL Take 1 tablet (100 mg total) by mouth at bedtime.  Indication:  Trouble Sleeping   VASCEPA 1 g Caps Generic drug:  Icosapent Ethyl Take 2 g by mouth daily with lunch.  Indication:  Cardiovascular Risk Reduction        Follow-up recommendations:  Activity:  as tolerated Diet:  low sodium heart ealthy Other:  keep folow up appointment  Comments:    Signed: Orson Slick, MD 06/15/2018, 9:17 AM

## 2018-06-14 NOTE — Progress Notes (Signed)
[facility] MD Progress Note  06/14/2018 12:36 PM Karen Beard  MRN:  [id number]  Subjective:    Karen Beard has made good progress. She is no longer hallucinating or paranoid. Sleep and appetite improved. She is relaxed, out in the community and appropriately engaging in conversation. She accepted medications, including Abilify injection and tolerates them well. Still complains of headaches.  Spoke with her mother who visited last night who opposes discharge feeling that the patient is still depressed. She knows it because "she was starring". Will meet with the family tomorrow. The patient has intellectual disability and separation from the family has been stressful. She is not suicidal or homicidal. No "bad thought".  Principal Problem: Undifferentiated schizophrenia ([place]) Diagnosis: Principal Problem:   Undifferentiated schizophrenia ([place]) Active Problems:   Migraine headache   HTN (hypertension)   Mild intellectual disability  Total Time spent with patient: 20 minutes  Past Psychiatric History: schizophrenia  Past Medical History:  Past Medical History:  Diagnosis Date  . Abnormal vaginal bleeding   . Breast cancer ([place]) 01/26/2015   Oncoltype score: 3. 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 Depression   . Edema of both legs   . Fibroids   . GERD (gastroesophageal reflux disease)   . High triglycerides   . Hypertension    RECENTLY TAKEN OFF OF MEDS PER MOM   . Learning disability   . Migraine    daily  . Personal history of radiation therapy   . 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 ABDOMINAL HYSTERECTOMY    . AXILLARY HIDRADENITIS EXCISION     unc  . AXILLARY SENTINEL NODE BIOPSY Right 02/23/2015   Sentinel lymph node biopsy x4, one with isolated tumor cells.;  Dr Jamal Collin, MD;  [facility] ORS;  ISOLATED  TUMOR CELLS IN ONE LYMPH NODE (1/1).  Marland Kitchen BREAST BIOPSY Right 01/15/2016   stereo Dr Bary Castilla, fat necrosis  . BREAST DUCTAL SYSTEM EXCISION Bilateral 01/26/2015   Bilateral retroareolar ductal excision for bloody nipple drainage, DCIS identified on the right  . BREAST LUMPECTOMY Right 02/06/2015   Invasive mammary carcinoma.  5 mm, DCIS at margin.  Surgeon: Christene Lye, MD;  Location: [facility] ORS;  Service: General;  Laterality: Right;  . CARPAL TUNNEL RELEASE    . CARPAL TUNNEL RELEASE Right 05/08/2016   Procedure: OPEN CARPAL TUNNEL RELEASE;  Surgeon: Corky Mull, MD;  Location: [place];  Service: Orthopedics;  Laterality: Right;  . CYSTOSCOPY N/A 11/24/2017   Procedure: CYSTOSCOPY;  Surgeon: Benjaman Kindler, MD;  Location: [facility] ORS;  Service: Gynecology;  Laterality: N/A;  . EXCISION / BIOPSY BREAST / NIPPLE / DUCT Bilateral 01/26/2015   left breast negative. right breast positive  . INGUINAL HIDRADENITIS EXCISION    . LAPAROSCOPIC BILATERAL SALPINGO OOPHERECTOMY Bilateral 11/24/2017   Procedure: LAPAROSCOPIC BILATERAL SALPINGO OOPHORECTOMY;  Surgeon: Benjaman Kindler, MD;  Location: [facility] ORS;  Service: Gynecology;  Laterality: Bilateral;  . LAPAROSCOPIC HYSTERECTOMY N/A 11/24/2017   Procedure: HYSTERECTOMY TOTAL LAPAROSCOPIC;  Surgeon: Benjaman Kindler, MD;  Location: [facility] ORS;  Service: Gynecology;  Laterality: N/A;   Family History:  Family History  Problem Relation Age of Onset  . Breast cancer Sister 63  . Testicular cancer Father    Family Psychiatric  History: none reported Social History:  Social History   Substance and  Sexual Activity  Alcohol Use No  . Alcohol/week: 0.0 standard drinks     Social History   Substance and Sexual Activity  Drug Use No    Social History   Socioeconomic History  . Marital status: Single    Spouse name: Not on file  . Number of children: Not on file  . Years of education: Not on file  . Highest education level: Not on  file  Occupational History  . Not on file  Social Needs  . Financial resource strain: Not on file  . Food insecurity:    Worry: Not on file    Inability: Not on file  . Transportation needs:    Medical: Not on file    Non-medical: Not on file  Tobacco Use  . Smoking status: Never Smoker  . Smokeless tobacco: Never Used  Substance and Sexual Activity  . Alcohol use: No    Alcohol/week: 0.0 standard drinks  . Drug use: No  . Sexual activity: Not on file  Lifestyle  . Physical activity:    Days per week: Not on file    Minutes per session: Not on file  . Stress: Not on file  Relationships  . Social connections:    Talks on phone: Not on file    Gets together: Not on file    Attends religious service: Not on file    Active member of club or organization: Not on file    Attends meetings of clubs or organizations: Not on file    Relationship status: Not on file  Other Topics Concern  . Not on file  Social History Narrative  . Not on file   Additional Social History:                         Sleep: Fair  Appetite:  Fair  Current Medications: Current Facility-Administered Medications  Medication Dose Route Frequency Provider Last Rate Last Dose  . acetaminophen (TYLENOL) tablet 650 mg  650 mg Oral Q6H PRN Clapacs, Madie Reno, MD   650 mg at 06/13/18 0827  . alum & mag hydroxide-simeth (MAALOX/MYLANTA) 200-200-20 MG/5ML suspension 30 mL  30 mL Oral Q4H PRN Clapacs, John T, MD      . ARIPiprazole (ABILIFY) tablet 20 mg  20 mg Oral Daily Arla Boutwell B, MD   20 mg at 06/14/18 0814  . ARIPiprazole ER (ABILIFY MAINTENA) injection 400 mg  400 mg Intramuscular Q28 days Messiah Rovira B, MD   400 mg at 06/13/18 1301  . aspirin-acetaminophen-caffeine (EXCEDRIN MIGRAINE) per tablet 1 tablet  1 tablet Oral Q6H PRN Clapacs, John T, MD      . docusate sodium (COLACE) capsule 100 mg  100 mg Oral BID Clapacs, Madie Reno, MD   100 mg at 06/14/18 0814  . losartan (COZAAR)  tablet 50 mg  50 mg Oral Daily Clapacs, Madie Reno, MD   50 mg at 06/14/18 2836   And  . hydrochlorothiazide (MICROZIDE) capsule 12.5 mg  12.5 mg Oral Daily Clapacs, John T, MD   12.5 mg at 06/14/18 6294  . hydrOXYzine (ATARAX/VISTARIL) tablet 50 mg  50 mg Oral TID PRN Clapacs, John T, MD      . loratadine (CLARITIN) tablet 10 mg  10 mg Oral Daily Clapacs, Madie Reno, MD   10 mg at 06/14/18 0814  . magnesium hydroxide (MILK OF MAGNESIA) suspension 30 mL  30 mL Oral Daily PRN Clapacs, Madie Reno, MD      .  sertraline (ZOLOFT) tablet 100 mg  100 mg Oral Daily Clapacs, Madie Reno, MD   100 mg at 06/14/18 0814  . temazepam (RESTORIL) capsule 7.5 mg  7.5 mg Oral QHS ,  B, MD   7.5 mg at 06/13/18 2132  . topiramate (TOPAMAX) tablet 100 mg  100 mg Oral Daily ,  B, MD   100 mg at 06/14/18 0814  . traZODone (DESYREL) tablet 100 mg  100 mg Oral QHS PRN Clapacs, Madie Reno, MD   100 mg at 06/12/18 2200    Lab Results:  Results for orders placed or performed during the hospital encounter of 06/12/18 (from the past 48 hour(s))  Pregnancy, urine     Status: None   Collection Time: 06/14/18  5:36 AM  Result Value Ref Range   Preg Test, Ur NEGATIVE NEGATIVE    Comment: Performed at [facility], [address] [place], [place] [address]    Blood Alcohol level:  Lab Results  Component Value Date   [facility] <10 06/11/2018   ETH <5 11/57/2620    Metabolic Disorder Labs: Lab Results  Component Value Date   HGBA1C 5.1 06/12/2018   MPG 99.67 06/12/2018   MPG 100 03/13/2016   No results found for: PROLACTIN Lab Results  Component Value Date   CHOL 175 06/12/2018   TRIG 270 (H) 06/12/2018   HDL 31 (L) 06/12/2018   CHOLHDL 5.6 06/12/2018   VLDL 54 (H) 06/12/2018   LDLCALC 90 06/12/2018   LDLCALC 109 (H) 03/13/2016    Physical Findings: AIMS:  , ,  ,  ,    CIWA:    COWS:     Musculoskeletal: Strength & Muscle Tone: within normal limits Gait & Station: normal Patient  leans: N/A  Psychiatric Specialty Exam: Physical Exam  Nursing note and vitals reviewed. Psychiatric: She has a normal mood and affect. Her speech is normal and behavior is normal. Judgment and thought content normal. Cognition and memory are normal.    Review of Systems  Neurological: Negative.   Psychiatric/Behavioral: Negative.   All other systems reviewed and are negative.   Blood pressure 131/89, pulse 99, temperature 98.3 F (36.8 C), temperature source Oral, resp. rate 18, height 5' 2" (1.575 m), weight 80.7 kg, last menstrual period 11/10/2017, SpO2 100 %.Body mass index is 32.56 kg/m.  General Appearance: Casual  Eye Contact:  Good  Speech:  Clear and Coherent  Volume:  Normal  Mood:  Euthymic  Affect:  Appropriate  Thought Process:  Goal Directed and Descriptions of Associations: Intact  Orientation:  Full (Time, Place, and Person)  Thought Content:  WDL  Suicidal Thoughts:  No  Homicidal Thoughts:  No  Memory:  Immediate;   Fair Recent;   Fair Remote;   Fair  Judgement:  Impaired  Insight:  Lacking  Psychomotor Activity:  Normal  Concentration:  Concentration: Fair and Attention Span: Fair  Recall:  [employer] of Knowledge:  Fair  Language:  Fair  Akathisia:  No  Handed:  Right  AIMS (if indicated):     Assets:  Communication Skills Desire for Improvement Financial Resources/Insurance Housing Physical Health Resilience Social Support  ADL's:  Intact  Cognition:  WNL  Sleep:  Number of Hours: 7.3     Treatment Plan Summary: Daily contact with patient to assess and evaluate symptoms and progress in treatment and Medication management   Ms. Fini is a 47 year old female with a history of schizpohrenia admitted floridly psychotic in the context  of medication noncompliance for two years. She is already improving on Abilify.  #Psychosis, improved -Abilify 20 mg daily for two more weeks -continue Abilify maintena 400 mg monthly injections started  12/28, next dose 07/10/2018 -Zoloft 100 mg daily  #Insomnia, resolved -stop Restoril  -start Trazodone 100 mg nightly  #HTN -continue Cozaar 50 mg and HCTZ 12.5 mg   #Mgraine headaches -Excedrin PRN available -Topamax 100 mg daily  #UTI -received Fosfomycin 3 g  #Labs -lipid panel shows elevated TG, TSH, A1C are normal -EKG -pregnancy test, hysterectomy in June 2019  #Disposition -discharge to home with the mother -family meeting -follow up with [facility] or better ACT team  Orson Slick, MD 06/14/2018, 12:36 PM

## 2018-06-15 NOTE — Progress Notes (Signed)
D: Pt denies SI/HI/AVH, contracts for safety. Pt is pleasant and cooperative, but minimal. Pt. has no Complaints. Patient Interactions with staff and peers appropriate. Pt. Goes to sleep again this evening very early, but does come up with request to take her night medications. No observations of responding to internal stimuli. Pt. Reports normal mood. Pt. Denies anxiety and depression.   A: Q x 15 minute observation checks were completed for safety. Patient was provided with education, but needs reinforcement. Patient was given/offered medications per orders. Patient  was encourage to attend groups, participate in unit activities and continue with plan of care. Pt. Chart and plans of care reviewed. Pt. Given support and encouragement.   R: Patient is complaint with medication and unit procedures. Pt. Attends snack time appropriately, eating a good snack. Pt. Encouraged to perform ECG testing, but would declines. Pt. Reports will do with female staff during day shift.            Precautionary checks every 15 minutes for safety maintained, room free of safety hazards, patient sustains no injury or falls during this shift. Will endorse care to next shift.

## 2018-06-15 NOTE — Progress Notes (Signed)
  [facility] Adult Case Management Discharge Plan :  Will you be returning to the same living situation after discharge:  Yes,  with mother At discharge, do you have transportation home?: Yes,  mother Do you have the ability to pay for your medications: Yes,  medicare/medicaid  Release of information consent forms completed and in the chart;  Patient's signature needed at discharge.  Patient to Follow up at: Follow-up [place] in 3 day(s).   Specialty:  Professional Counselor Why:  Please attend a walk in appt Monday-Friday between 8:30am-12noon or 1-2:30pm.  Please bring photo ID and your hospital discharge paperwork. Contact information: [facility] [place] [address] [phone]           Next level of care provider has access to [place] and Suicide Prevention discussed: Yes,  with mother  Have you used any form of tobacco in the last 30 days? (Cigarettes, Smokeless Tobacco, Cigars, and/or Pipes): No  Has patient been referred to the Quitline?: N/A patient is not a smoker  Patient has been referred for addiction treatment: N/A  Joanne Chars, LCSW 06/15/2018, 10:05 AM

## 2018-06-15 NOTE — Progress Notes (Signed)
Recreation Therapy Notes  Date: 06/15/2018  Time: 9:30 am  Location: Craft Room  Behavioral response: Appropriate   Intervention Topic: Coping Skills  Discussion/Intervention:  Group content on today was focused on coping skills. The group defined what coping skills are and when they can be used. Individuals described how they normally cope with thing and the coping skills they normally use. Patients expressed why it is important to cope with things and how not coping with things can affect you. The group participated in the intervention "My coping box" and made coping boxes while adding coping skills they could use in the future to the box. Clinical Observations/Feedback:  Patient came to group and identified her coping skills as spending time with love ones. Individual was social with peers and staff while participating in the intervention.  Karen Beard LRT/CTRS         [name] 06/15/2018 11:56 AM

## 2018-06-15 NOTE — [facility] Suicide Risk Assessment (Signed)
[place] INPATIENT:  Family/Significant Other Suicide Prevention Education  Suicide Prevention Education:  Education Completed; Ariyona Eid, mother, [phone], has been identified by the patient as the family member/significant other with whom the patient will be residing, and identified as the person(s) who will aid the patient in the event of a mental health crisis (suicidal ideations/suicide attempt).  With written consent from the patient, the family member/significant other has been provided the following suicide prevention education, prior to the and/or following the discharge of the patient.  The suicide prevention education provided includes the following:  Suicide risk factors  Suicide prevention and interventions  National Suicide Hotline telephone number  [facility] assessment telephone number  [facility] Emergency Assistance [place] and/or Residential Mobile Crisis Unit telephone number  Request made of family/significant other to:  Remove weapons (e.g., guns, rifles, knives), all items previously/currently identified as safety concern.  NO guns in the home, per mother.    Remove drugs/medications (over-the-counter, prescriptions, illicit drugs), all items previously/currently identified as a safety concern.  The family member/significant other verbalizes understanding of the suicide prevention education information provided.  The family member/significant other agrees to remove the items of safety concern listed above.  Pt does live in [place]. Discussed follow up plans with mother for FSOP.  Joanne Chars, LCSW 06/15/2018, 9:29 AM

## 2018-06-15 NOTE — Progress Notes (Signed)
Patient alert and oriented x 4. Ambulates unit with steady gait. Verbally denies SI/HI/AVH and pain. Patient discharged on above date and time. Verbalized understanding the discharge information provided to patient upon discharge. Patient departed unit with discharge paperwork, prescriptions, 7 day supply of medications and personal belongings. Picked up by her mother to follow up with outpatient services. No distress noted.

## 2018-06-16 ENCOUNTER — Other Ambulatory Visit: Payer: Self-pay

## 2018-06-16 DIAGNOSIS — C50111 Malignant neoplasm of central portion of right female breast: Secondary | ICD-10-CM

## 2018-06-16 DIAGNOSIS — Z170 Estrogen receptor positive status [ER+]: Principal | ICD-10-CM

## 2018-07-22 ENCOUNTER — Other Ambulatory Visit: Payer: Self-pay | Admitting: Oncology

## 2018-07-22 DIAGNOSIS — Z170 Estrogen receptor positive status [ER+]: Principal | ICD-10-CM

## 2018-07-22 DIAGNOSIS — C50111 Malignant neoplasm of central portion of right female breast: Secondary | ICD-10-CM

## 2018-07-24 ENCOUNTER — Other Ambulatory Visit: Payer: Self-pay | Admitting: Oncology

## 2018-07-24 DIAGNOSIS — Z170 Estrogen receptor positive status [ER+]: Principal | ICD-10-CM

## 2018-07-24 DIAGNOSIS — C50111 Malignant neoplasm of central portion of right female breast: Secondary | ICD-10-CM

## 2018-07-27 ENCOUNTER — Other Ambulatory Visit: Payer: Self-pay | Admitting: Oncology

## 2018-07-27 DIAGNOSIS — C50111 Malignant neoplasm of central portion of right female breast: Secondary | ICD-10-CM

## 2018-07-27 DIAGNOSIS — Z170 Estrogen receptor positive status [ER+]: Principal | ICD-10-CM

## 2018-07-31 ENCOUNTER — Ambulatory Visit
Admission: RE | Admit: 2018-07-31 | Discharge: 2018-07-31 | Disposition: A | Discharge status: Home / Self Care | Payer: Medicare Other | Source: Ambulatory Visit | Attending: Surgery | Admitting: Surgery

## 2018-07-31 DIAGNOSIS — C50111 Malignant neoplasm of central portion of right female breast: Secondary | ICD-10-CM | POA: Diagnosis not present

## 2018-07-31 DIAGNOSIS — Z170 Estrogen receptor positive status [ER+]: Principal | ICD-10-CM

## 2018-08-04 ENCOUNTER — Encounter: Payer: Self-pay | Admitting: General Surgery

## 2018-08-04 ENCOUNTER — Ambulatory Visit (INDEPENDENT_AMBULATORY_CARE_PROVIDER_SITE_OTHER): Payer: Medicare Other | Admitting: General Surgery

## 2018-08-04 ENCOUNTER — Other Ambulatory Visit: Payer: Self-pay | Admitting: Oncology

## 2018-08-04 VITALS — BP 132/80 | Temp 97.7°F | Resp 14 | Ht 62.0 in | Wt 174.0 lb

## 2018-08-04 DIAGNOSIS — C50111 Malignant neoplasm of central portion of right female breast: Secondary | ICD-10-CM | POA: Diagnosis not present

## 2018-08-04 DIAGNOSIS — Z170 Estrogen receptor positive status [ER+]: Secondary | ICD-10-CM | POA: Diagnosis not present

## 2018-08-04 NOTE — Progress Notes (Signed)
Patient ID: Karen Beard, female   DOB: 1971-04-20, 48 y.o.   MRN: [id number]  Chief Complaint  Patient presents with  . Follow-up    mammogram     HPI Karen Beard is a 48 y.o. female who presents for a breast evaluation. The most recent mammogram was done on 07/31/2018.  Patient does perform regular self breast checks and gets regular mammograms done. Mother, [name] is present at visit.   HPI  Past Medical History:  Diagnosis Date  . Abnormal vaginal bleeding   . Breast cancer ([place]) 01/26/2015   Oncoltype score: 3. 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 Depression   . Edema of both legs   . Fibroids   . GERD (gastroesophageal reflux disease)   . High triglycerides   . Hypertension    RECENTLY TAKEN OFF OF MEDS PER MOM   . Learning disability   . Migraine    daily  . Personal history of radiation therapy   . Seizures ([place])    AS A CHILD-NONE SINCE    Past Surgical History:  Procedure Laterality Date  . ABDOMINAL HYSTERECTOMY    . AXILLARY HIDRADENITIS EXCISION     unc  . AXILLARY SENTINEL NODE BIOPSY Right 02/23/2015   Sentinel lymph node biopsy x4, one with isolated tumor cells.;  Dr Jamal Collin, MD;  [facility] ORS;  ISOLATED TUMOR CELLS IN ONE LYMPH NODE (1/1).  Marland Kitchen BREAST BIOPSY Right 01/15/2016   stereo Dr Bary Castilla, fat necrosis  . BREAST DUCTAL SYSTEM EXCISION Bilateral 01/26/2015   Bilateral retroareolar ductal excision for bloody nipple drainage, DCIS identified on the right  . BREAST LUMPECTOMY Right 02/06/2015   Invasive mammary carcinoma.  5 mm, DCIS at margin.  Surgeon: Christene Lye, MD;  Location: [facility] ORS;  Service: General;  Laterality: Right;  . CARPAL TUNNEL RELEASE    . CARPAL TUNNEL RELEASE Right 05/08/2016   Procedure: OPEN CARPAL TUNNEL RELEASE;  Surgeon: Corky Mull, MD;  Location: [place];  Service: Orthopedics;   Laterality: Right;  . CYSTOSCOPY N/A 11/24/2017   Procedure: CYSTOSCOPY;  Surgeon: Benjaman Kindler, MD;  Location: [facility] ORS;  Service: Gynecology;  Laterality: N/A;  . EXCISION / BIOPSY BREAST / NIPPLE / DUCT Bilateral 01/26/2015   left breast negative. right breast positive  . INGUINAL HIDRADENITIS EXCISION    . LAPAROSCOPIC BILATERAL SALPINGO OOPHERECTOMY Bilateral 11/24/2017   Procedure: LAPAROSCOPIC BILATERAL SALPINGO OOPHORECTOMY;  Surgeon: Benjaman Kindler, MD;  Location: [facility] ORS;  Service: Gynecology;  Laterality: Bilateral;  . LAPAROSCOPIC HYSTERECTOMY N/A 11/24/2017   Procedure: HYSTERECTOMY TOTAL LAPAROSCOPIC;  Surgeon: Benjaman Kindler, MD;  Location: [facility] ORS;  Service: Gynecology;  Laterality: N/A;    Family History  Problem Relation Age of Onset  . Breast cancer Sister 82  . Testicular cancer Father     Social History Social History   Tobacco Use  . Smoking status: Never Smoker  . Smokeless tobacco: Never Used  Substance Use Topics  . Alcohol use: No    Alcohol/week: 0.0 standard drinks  . Drug use: No    Allergies  Allergen Reactions  . Sumatriptan Shortness Of Breath  . Cephalexin Nausea And Vomiting  . Chocolate Swelling  . Corn Oil Other (See Comments)    By test. Pt has eaten without issues.  . Peanut Oil Swelling and Other (See Comments)    throat  . Peanut-Containing Drug Products Swelling  .  Phenobarbital Other (See Comments)    Hyperactivity, aggitation  . Vilazodone Hcl Nausea Only  . Duloxetine Hcl Anxiety and Other (See Comments)    agitation  . Latex Rash and Other (See Comments)    Bandaids, gloves(when worn)  . Sulfa Antibiotics Rash  . Tape Rash    Current Outpatient Medications  Medication Sig Dispense Refill  . ARIPiprazole (ABILIFY) 20 MG tablet Take 1 tablet (20 mg total) by mouth daily. For two weeks 14 tablet 0  . ARIPiprazole ER (ABILIFY MAINTENA) 400 MG SRER injection Inject 2 mLs (400 mg total) into the muscle every 28  (twenty-eight) days. Next injectio on 07/10/2018 1 each 1  . aspirin-acetaminophen-caffeine (EXCEDRIN MIGRAINE) 250-250-65 MG per tablet Take 1 tablet by mouth every 6 (six) hours as needed for headache.     . cetirizine (ZYRTEC) 10 MG tablet Take 10 mg by mouth daily as needed for allergies.     Vanessa Kick Ethyl (VASCEPA) 1 g CAPS Take 2 g by mouth daily with lunch.    . sertraline (ZOLOFT) 100 MG tablet Take 1 tablet (100 mg total) by mouth daily. 30 tablet 1  . tamoxifen (NOLVADEX) 20 MG tablet TAKE 1 TABLET BY MOUTH DAILY 30 tablet 11  . losartan-hydrochlorothiazide (HYZAAR) 50-12.5 MG tablet Take 1 tablet by mouth daily.      No current facility-administered medications for this visit.     Review of Systems Review of Systems  Constitutional: Negative.   Respiratory: Positive for cough. Negative for shortness of breath.        The patient reports she has been seen by her PCP or his associates on 3 occasions in the last 2 months.  Oral antibiotics x2, inhaler.  Minimal-modest improvement.  Cardiovascular: Negative.     Blood pressure 132/80, temperature 97.7 F (36.5 C), temperature source Skin, resp. rate 14, height _0  (1.575 m), weight 174 lb (78.9 kg), last menstrual period 11/10/2017, SpO2 96 %.  Physical Exam Physical Exam Constitutional:      Appearance: She is well-developed.  Eyes:     General: No scleral icterus.    Conjunctiva/sclera: Conjunctivae normal.  Neck:     Musculoskeletal: Neck supple.  Cardiovascular:     Rate and Rhythm: Normal rate and regular rhythm.     Heart sounds: Normal heart sounds.  Pulmonary:     Effort: Pulmonary effort is normal.     Breath sounds: Examination of the right-lower field reveals rhonchi. Examination of the left-lower field reveals rhonchi. Rhonchi present.  Chest:     Breasts:        Right: No inverted nipple, mass, nipple discharge, skin change or tenderness.        Left: No inverted nipple, mass, nipple discharge, skin  change or tenderness.    Lymphadenopathy:     Cervical: No cervical adenopathy.     Upper Body:     Right upper body: No supraclavicular or axillary adenopathy.     Left upper body: No supraclavicular or axillary adenopathy.  Skin:    General: Skin is warm and dry.  Neurological:     Mental Status: She is alert and oriented to person, place, and time.     Data Reviewed Bilateral diagnostic mammograms dated July 31, 2018 were reviewed.  BI-RADS-2.  Assessment    No evidence of recurrent breast cancer.    Plan   The patient has been asked to return to the office in one year with a bilateral diagnostic mammogram. The  patient is aware to call back for any questions or concerns.   HPI, Physical Exam, Assessment and Plan have been scribed under the direction and in the presence of Hervey Ard, MD.  Gaspar Cola, CMA  I have completed the exam and reviewed the above documentation for accuracy and completeness.  I agree with the above.  Haematologist has been used and any errors in dictation or transcription are unintentional.  Hervey Ard, M.D., F.A.C.S.  Forest Gleason Chayden Garrelts 08/04/2018, 11:07 AM

## 2018-08-04 NOTE — Patient Instructions (Signed)
The patient has been asked to return to the office in one year with a bilateral diagnostic mammogram.The patient is aware to call back for any questions or concerns. 

## 2018-08-14 ENCOUNTER — Ambulatory Visit
Admission: RE | Admit: 2018-08-14 | Discharge: 2018-08-14 | Disposition: A | Discharge status: Home / Self Care | Payer: Medicare Other | Source: Ambulatory Visit | Attending: Radiation Oncology | Admitting: Radiation Oncology

## 2018-08-14 ENCOUNTER — Encounter: Payer: Self-pay | Admitting: Radiation Oncology

## 2018-08-14 ENCOUNTER — Other Ambulatory Visit: Payer: Self-pay | Admitting: *Deleted

## 2018-08-14 ENCOUNTER — Other Ambulatory Visit: Payer: Self-pay

## 2018-08-14 VITALS — BP 122/81 | HR 76 | Temp 96.8°F | Resp 18 | Wt 179.9 lb

## 2018-08-14 DIAGNOSIS — Z170 Estrogen receptor positive status [ER+]: Secondary | ICD-10-CM | POA: Diagnosis not present

## 2018-08-14 DIAGNOSIS — C50111 Malignant neoplasm of central portion of right female breast: Secondary | ICD-10-CM

## 2018-08-14 DIAGNOSIS — Z79811 Long term (current) use of aromatase inhibitors: Secondary | ICD-10-CM | POA: Diagnosis not present

## 2018-08-14 DIAGNOSIS — Z923 Personal history of irradiation: Secondary | ICD-10-CM | POA: Insufficient documentation

## 2018-08-14 DIAGNOSIS — C50911 Malignant neoplasm of unspecified site of right female breast: Secondary | ICD-10-CM | POA: Insufficient documentation

## 2018-08-14 MED ORDER — TAMOXIFEN CITRATE 20 MG PO TABS: 20.0000 mg | ORAL_TABLET | Freq: Every day | ORAL | 11 refills | Status: DC

## 2018-08-14 NOTE — Progress Notes (Signed)
Radiation Oncology Follow up Note  Name: Karen Beard   Date:   08/14/2018 MRN:  [id number] DOB: Mar 24, 1971    This 48 y.o. female presents to the clinic today for 3 year follow-up status post whole breast radiation to her right breast for stage I invasive mammary carcinoma.  REFERRING PROVIDER: Maryland Pink, MD  HPI: patient is a 48 year old female now out 3 years having completed whole breast radiation to her right breast for stage I invasive mammary carcinoma. She is seen today in routine follow-up and is doing well. She specifically denies breast tenderness cough or bone pain. She's currently on tamoxifen tolerating that well without side effect. Unfortunately her prescription has run out really contact Dr. Gary Fleet office to have that renewed...she had mammograms this month which I have reviewed were BI-RADS 2 benign.  COMPLICATIONS OF TREATMENT: none  FOLLOW UP COMPLIANCE: keeps appointments   PHYSICAL EXAM:  BP 122/81   Pulse 76   Temp (!) 96.8 F (36 C)   Resp 18   Wt 179 lb 14.3 oz (81.6 kg)   LMP 11/10/2017   BMI 32.90 kg/m  Right breast is somewhat shrunk from the left breast although cosmetic result is still good she still does have some telangiectatic changes of the skin. No dominant mass or nodularity is noted in either breast in 2 positions examined. No axillary or supraclavicular adenopathy is identified.Well-developed well-nourished patient in NAD. HEENT reveals PERLA, EOMI, discs not visualized.  Oral cavity is clear. No oral mucosal lesions are identified. Neck is clear without evidence of cervical or supraclavicular adenopathy. Lungs are clear to A&P. Cardiac examination is essentially unremarkable with regular rate and rhythm without murmur rub or thrill. Abdomen is benign with no organomegaly or masses noted. Motor sensory and DTR levels are equal and symmetric in the upper and lower extremities. Cranial nerves II through XII are grossly intact.  Proprioception is intact. No peripheral adenopathy or edema is identified. No motor or sensory levels are noted. Crude visual fields are within normal range.  RADIOLOGY RESULTS: mammograms are reviewed and compatible above-stated findings  PLAN: present time she is doing well with no evidence of disease. Work contact Dr. Gary Fleet office to have her tamoxifen refilled. We'll also setting up a follow-up appointment with him in his office. I have asked to see her back in 1 year for follow-up. Patient knows to call with any concerns at any time.  I would like to take this opportunity to thank you for allowing me to participate in the care of your patient.Noreene Filbert, MD

## 2018-08-23 NOTE — Progress Notes (Signed)
[place]  [phone] [phone]  ID: Karen Beard OB: 08-25-1970  MR#: [id number]  SNK#:[id number]  Patient Care Team: Maryland Pink, MD as PCP - General (Family Medicine) Maryland Pink, MD as Referring Physician (Family Medicine) Bary Castilla Forest Gleason, MD (General Surgery)  CHIEF COMPLAINT: Pathologic stage Ia ER/PR positive, HER-2/neu not overexpressing adenocarcinoma of the right breast, central portion.  INTERVAL HISTORY: Patient last evaluated in clinic in June 2018.  She has been out of her tamoxifen for several weeks and returns to clinic for further evaluation.  She currently feels well and is asymptomatic.  She has no neurologic complaints.  She denies any recent fevers or illnesses.  She has a good appetite and denies weight loss.  She has no chest pain or shortness of breath.  She denies any nausea, vomiting, constipation, or diarrhea.  She has no urinary complaints.  Patient feels that her baseline offers no specific complaints today.  REVIEW OF SYSTEMS:   Review of Systems  Constitutional: Negative.  Negative for fever, malaise/fatigue and weight loss.  Respiratory: Negative.  Negative for cough and shortness of breath.   Cardiovascular: Negative.  Negative for chest pain and leg swelling.  Gastrointestinal: Negative.  Negative for abdominal pain.  Genitourinary: Negative.  Negative for dysuria.  Musculoskeletal: Negative.  Negative for back pain.  Skin: Negative.  Negative for rash.  Neurological: Negative.  Negative for dizziness, focal weakness, weakness and headaches.  Psychiatric/Behavioral: Negative.  The patient is not nervous/anxious.     As per HPI. Otherwise, a complete review of systems is negative.  PAST MEDICAL HISTORY: Past Medical History:  Diagnosis Date  . Abnormal vaginal bleeding   . Breast cancer ([facility]) 01/26/2015   Oncoltype score: 3. 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 Depression   . Edema of both legs   . Fibroids   . GERD (gastroesophageal reflux disease)   . High triglycerides   . Hypertension    RECENTLY TAKEN OFF OF MEDS PER MOM   . Learning disability   . Migraine    daily  . Personal history of radiation therapy   . Seizures ([place])    AS A CHILD-NONE SINCE    PAST SURGICAL HISTORY: Past Surgical History:  Procedure Laterality Date  . ABDOMINAL HYSTERECTOMY    . AXILLARY HIDRADENITIS EXCISION     unc  . AXILLARY SENTINEL NODE BIOPSY Right 02/23/2015   Sentinel lymph node biopsy x4, one with isolated tumor cells.;  Dr Jamal Collin, MD;  [facility];  ISOLATED TUMOR CELLS IN ONE LYMPH NODE (1/1).  Marland Kitchen BREAST BIOPSY Right 01/15/2016   stereo Dr Bary Castilla, fat necrosis  . BREAST DUCTAL SYSTEM EXCISION Bilateral 01/26/2015   Bilateral retroareolar ductal excision for bloody nipple drainage, DCIS identified on the right  . BREAST LUMPECTOMY Right 02/06/2015   Invasive mammary carcinoma.  5 mm, DCIS at margin.  Surgeon: Christene Lye, MD;  Location: [facility] ORS;  Service: General;  Laterality: Right;  . CARPAL TUNNEL RELEASE    . CARPAL TUNNEL RELEASE Right 05/08/2016   Procedure: OPEN CARPAL TUNNEL RELEASE;  Surgeon: Corky Mull, MD;  Location: [place];  Service: Orthopedics;  Laterality: Right;  . CYSTOSCOPY N/A 11/24/2017   Procedure: CYSTOSCOPY;  Surgeon: Benjaman Kindler, MD;  Location: [facility] ORS;  Service: Gynecology;  Laterality: N/A;  . EXCISION / BIOPSY BREAST / NIPPLE / DUCT Bilateral  01/26/2015   left breast negative. right breast positive  . INGUINAL HIDRADENITIS EXCISION    . LAPAROSCOPIC BILATERAL SALPINGO OOPHERECTOMY Bilateral 11/24/2017   Procedure: LAPAROSCOPIC BILATERAL SALPINGO OOPHORECTOMY;  Surgeon: Benjaman Kindler, MD;  Location: [facility] ORS;  Service: Gynecology;  Laterality: Bilateral;  . LAPAROSCOPIC HYSTERECTOMY N/A 11/24/2017   Procedure:  HYSTERECTOMY TOTAL LAPAROSCOPIC;  Surgeon: Benjaman Kindler, MD;  Location: [facility] ORS;  Service: Gynecology;  Laterality: N/A;    FAMILY HISTORY: Family History  Problem Relation Age of Onset  . Breast cancer Sister 70  . Testicular cancer Father     ADVANCED DIRECTIVES (Y/N):  N  HEALTH MAINTENANCE: Social History   Tobacco Use  . Smoking status: Never Smoker  . Smokeless tobacco: Never Used  Substance Use Topics  . Alcohol use: No    Alcohol/week: 0.0 standard drinks  . Drug use: No     Colonoscopy:  PAP:  Bone density:  Lipid panel:  Allergies  Allergen Reactions  . Sumatriptan Shortness Of Breath  . Cephalexin Nausea And Vomiting  . Chocolate Swelling  . Corn Oil Other (See Comments)    By test. Pt has eaten without issues.  . Peanut Oil Swelling and Other (See Comments)    throat  . Peanut-Containing Drug Products Swelling  . Phenobarbital Other (See Comments)    Hyperactivity, aggitation  . Vilazodone Hcl Nausea Only  . Duloxetine Hcl Anxiety and Other (See Comments)    agitation  . Latex Rash and Other (See Comments)    Bandaids, gloves(when worn)  . Sulfa Antibiotics Rash  . Tape Rash    Current Outpatient Medications  Medication Sig Dispense Refill  . aspirin-acetaminophen-caffeine (EXCEDRIN MIGRAINE) 250-250-65 MG per tablet Take 1 tablet by mouth every 6 (six) hours as needed for headache.     . cetirizine (ZYRTEC) 10 MG tablet Take 10 mg by mouth daily as needed for allergies.     Vanessa Kick Ethyl (VASCEPA) 1 g CAPS Take 2 g by mouth daily with lunch.    . QUEtiapine (SEROQUEL) 50 MG tablet     . sertraline (ZOLOFT) 100 MG tablet Take 1 tablet (100 mg total) by mouth daily. 30 tablet 1  . tamoxifen (NOLVADEX) 20 MG tablet Take 1 tablet (20 mg total) by mouth daily. 30 tablet 11  . traZODone (DESYREL) 100 MG tablet     . losartan-hydrochlorothiazide (HYZAAR) 50-12.5 MG tablet Take 1 tablet by mouth daily.      No current facility-administered  medications for this visit.     OBJECTIVE: Vitals:   08/28/18 1029  BP: (!) 143/93  Pulse: (!) 130  Resp: 18  Temp: (!) 97.5 F (36.4 C)     Body mass index is 33.47 kg/m.    ECOG FS:0 - Asymptomatic  General: Well-developed, well-nourished, no acute distress. Eyes: Pink conjunctiva, anicteric sclera. HEENT: Normocephalic, moist mucous membranes, clear oropharnyx. Breast: Patient declined breast exam today. Lungs: Clear to auscultation bilaterally. Heart: Regular rate and rhythm. No rubs, murmurs, or gallops. Abdomen: Soft, nontender, nondistended. No organomegaly noted, normoactive bowel sounds. Musculoskeletal: No edema, cyanosis, or clubbing. Neuro: Alert, answering all questions appropriately. Cranial nerves grossly intact. Skin: No rashes or petechiae noted. Psych: Normal affect.  LAB RESULTS:  Lab Results  Component Value Date   NA 138 06/11/2018   K 2.8 (L) 06/11/2018   CL 102 06/11/2018   CO2 29 06/11/2018   GLUCOSE 108 (H) 06/11/2018   BUN 14 06/11/2018   CREATININE 0.90 06/11/2018   CALCIUM  8.7 (L) 06/11/2018   PROT 7.0 06/11/2018   ALBUMIN 4.0 06/11/2018   AST 30 06/11/2018   ALT 23 06/11/2018   ALKPHOS 57 06/11/2018   BILITOT 0.8 06/11/2018   GFRNONAA >60 06/11/2018   GFRAA >60 06/11/2018    Lab Results  Component Value Date   WBC 6.1 06/11/2018   NEUTROABS 3.8 06/11/2018   HGB 13.2 06/11/2018   HCT 40.9 06/11/2018   MCV 79.6 (L) 06/11/2018   PLT 279 06/11/2018     STUDIES: Mm Diag Breast Tomo Bilateral  Result Date: 07/31/2018 CLINICAL DATA:  History of right breast cancer status post lumpectomy in 2016. EXAM: DIGITAL DIAGNOSTIC BILATERAL MAMMOGRAM WITH CAD AND TOMO COMPARISON:  Previous exam(s). ACR Breast Density Category b: There are scattered areas of fibroglandular density. FINDINGS: Stable lumpectomy changes are seen in the right breast. No suspicious mass or malignant type microcalcifications identified in either breast. Mammographic  images were processed with CAD. IMPRESSION: No evidence of malignancy in either breast. RECOMMENDATION: Bilateral diagnostic mammogram in 1 year is recommended. I have discussed the findings and recommendations with the patient. Results were also provided in writing at the conclusion of the visit. If applicable, a reminder letter will be sent to the patient regarding the next appointment. BI-RADS CATEGORY  2: Benign. Electronically Signed   By: Lillia Mountain M.D.   On: 07/31/2018 11:10    ASSESSMENT: Pathologic stage Ia ER/PR positive, HER-2/neu not overexpressing adenocarcinoma of the right breast, central portion.  PLAN:    1. Pathologic stage Ia ER/PR positive, HER-2/neu not overexpressing adenocarcinoma of the right breast, central portion: Patient was lost to follow-up and has not been seen in nearly 18 months.  She states she has been taking tamoxifen the entire time, but recently ran out in the last several weeks.  She was given a refill today and she will complete 5 years of treatment in December 2021.  Her most recent mammogram on July 31, 2018 was reported as BI-RADS 2.  Repeat in February 2021.  Return to clinic in 6 months for routine evaluation. 2.  Iron deficiency anemia: Resolved.  I spent a total of 30 minutes face-to-face with the patient of which greater than 50% of the visit was spent in counseling and coordination of care as detailed above.   Patient expressed understanding and was in agreement with this plan. She also understands that She can call clinic at any time with any questions, concerns, or complaints.   Cancer Staging Cancer of central portion of right female breast [facility]) Staging form: Breast, AJCC 7th Edition - Clinical stage from 03/15/2015: Stage IA (T1a, N0, M0) - Signed by Lloyd Huger, MD on 03/15/2015   Lloyd Huger, MD   08/29/2018 9:38 AM

## 2018-08-28 ENCOUNTER — Encounter: Payer: Self-pay | Admitting: Oncology

## 2018-08-28 ENCOUNTER — Inpatient Hospital Stay: Payer: Medicare Other | Attending: Oncology | Admitting: Oncology

## 2018-08-28 ENCOUNTER — Other Ambulatory Visit: Payer: Self-pay

## 2018-08-28 VITALS — BP 143/93 | HR 130 | Temp 97.5°F | Resp 18 | Wt 183.0 lb

## 2018-08-28 DIAGNOSIS — Z170 Estrogen receptor positive status [ER+]: Secondary | ICD-10-CM | POA: Diagnosis not present

## 2018-08-28 DIAGNOSIS — C50111 Malignant neoplasm of central portion of right female breast: Secondary | ICD-10-CM | POA: Insufficient documentation

## 2018-08-28 NOTE — Progress Notes (Signed)
Patient denies any concerns today.  

## 2019-01-12 ENCOUNTER — Encounter: Payer: Self-pay | Admitting: General Surgery

## 2019-02-19 NOTE — Progress Notes (Signed)
[facility]  [phone] [phone]  ID: Anthony Denise Schiffer OB: 11/07/1970  MR#: [id number]  CSN#:[id number]  Patient Care Team: Hedrick, James, MD as PCP - General (Family Medicine) Hedrick, James, MD as Referring Physician (Family Medicine) Byrnett, Jeffrey W, MD (General Surgery)  CHIEF COMPLAINT: Pathologic stage Ia ER/PR positive, HER-2/neu not overexpressing adenocarcinoma of the right breast, central portion.  INTERVAL HISTORY: Patient returns to clinic today for routine 6-month evaluation.  She continues to tolerate tamoxifen well without significant side effects.  She currently feels well and is asymptomatic. She has no neurologic complaints.  She denies any recent fevers or illnesses.  She has a good appetite and denies weight loss.  She denies any chest pain, shortness of breath, cough, or hemoptysis.  She denies any nausea, vomiting, constipation, or diarrhea.  She has no urinary complaints.  Patient feels at her baseline offers no specific complaints today.  REVIEW OF SYSTEMS:   Review of Systems  Constitutional: Negative.  Negative for fever, malaise/fatigue and weight loss.  Respiratory: Negative.  Negative for cough and shortness of breath.   Cardiovascular: Negative.  Negative for chest pain and leg swelling.  Gastrointestinal: Negative.  Negative for abdominal pain.  Genitourinary: Negative.  Negative for dysuria.  Musculoskeletal: Negative.  Negative for back pain.  Skin: Negative.  Negative for rash.  Neurological: Negative.  Negative for dizziness, focal weakness, weakness and headaches.  Psychiatric/Behavioral: Negative.  The patient is not nervous/anxious.     As per HPI. Otherwise, a complete review of systems is negative.  PAST MEDICAL HISTORY: Past Medical History:  Diagnosis Date  . Abnormal vaginal bleeding   . Breast cancer ([facility]) 01/26/2015   Oncoltype score: 3. 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 Depression   . Edema of both legs   . Fibroids   . GERD (gastroesophageal reflux disease)   . High triglycerides   . Hypertension    RECENTLY TAKEN OFF OF MEDS PER MOM   . Learning disability   . Migraine    daily  . Personal history of radiation therapy   . Seizures ([facility])    AS A CHILD-NONE SINCE    PAST SURGICAL HISTORY: Past Surgical History:  Procedure Laterality Date  . ABDOMINAL HYSTERECTOMY    . AXILLARY HIDRADENITIS EXCISION     unc  . AXILLARY SENTINEL NODE BIOPSY Right 02/23/2015   Sentinel lymph node biopsy x4, one with isolated tumor cells.;  Dr Sankar, MD;  [facility];  ISOLATED TUMOR CELLS IN ONE LYMPH NODE (1/1).  . BREAST BIOPSY Right 01/15/2016   stereo Dr Byrnett, fat necrosis  . BREAST DUCTAL SYSTEM EXCISION Bilateral 01/26/2015   Bilateral retroareolar ductal excision for bloody nipple drainage, DCIS identified on the right  . BREAST LUMPECTOMY Right 02/06/2015   Invasive mammary carcinoma.  5 mm, DCIS at margin.  Surgeon: Seeplaputhur G Sankar, MD;  Location: [facility];  Service: General;  Laterality: Right;  . CARPAL TUNNEL RELEASE    . CARPAL TUNNEL RELEASE Right 05/08/2016   Procedure: OPEN CARPAL TUNNEL RELEASE;  Surgeon: John J Poggi, MD;  Location: [facility];  Service: Orthopedics;  Laterality: Right;  . CYSTOSCOPY N/A 11/24/2017   Procedure: CYSTOSCOPY;  Surgeon: Beasley, Bethany, MD;  Location: [facility];  Service: Gynecology;  Laterality: N/A;  . EXCISION / BIOPSY BREAST / NIPPLE / DUCT Bilateral 01/26/2015   left breast   negative. right breast positive  . INGUINAL HIDRADENITIS EXCISION    . LAPAROSCOPIC BILATERAL SALPINGO OOPHERECTOMY Bilateral 11/24/2017   Procedure: LAPAROSCOPIC BILATERAL SALPINGO OOPHORECTOMY;  Surgeon: Benjaman Kindler, MD;  Location: [facility];  Service: Gynecology;  Laterality: Bilateral;  . LAPAROSCOPIC HYSTERECTOMY N/A  11/24/2017   Procedure: HYSTERECTOMY TOTAL LAPAROSCOPIC;  Surgeon: Benjaman Kindler, MD;  Location: [facility];  Service: Gynecology;  Laterality: N/A;    FAMILY HISTORY: Family History  Problem Relation Age of Onset  . Breast cancer Sister 4  . Testicular cancer Father     ADVANCED DIRECTIVES (Y/N):  N  HEALTH MAINTENANCE: Social History   Tobacco Use  . Smoking status: Never Smoker  . Smokeless tobacco: Never Used  Substance Use Topics  . Alcohol use: No    Alcohol/week: 0.0 standard drinks  . Drug use: No     Colonoscopy:  PAP:  Bone density:  Lipid panel:  Allergies  Allergen Reactions  . Sumatriptan Shortness Of Breath  . Cephalexin Nausea And Vomiting  . Chocolate Swelling  . Corn Oil Other (See Comments)    By test. Pt has eaten without issues.  . Peanut Oil Swelling and Other (See Comments)    throat  . Peanut-Containing Drug Products Swelling  . Phenobarbital Other (See Comments)    Hyperactivity, aggitation  . Vilazodone Hcl Nausea Only  . Duloxetine Hcl Anxiety and Other (See Comments)    agitation  . Latex Rash and Other (See Comments)    Bandaids, gloves(when worn)  . Sulfa Antibiotics Rash  . Tape Rash    Current Outpatient Medications  Medication Sig Dispense Refill  . Icosapent Ethyl (VASCEPA) 1 g CAPS Take 2 g by mouth daily with lunch.    . losartan-hydrochlorothiazide (HYZAAR) 50-12.5 MG tablet Take 1 tablet by mouth daily.     Marland Kitchen OLANZapine (ZYPREXA) 20 MG tablet Take 20 mg by mouth at bedtime.     . tamoxifen (NOLVADEX) 20 MG tablet Take 1 tablet (20 mg total) by mouth daily. 30 tablet 11  . aspirin-acetaminophen-caffeine (EXCEDRIN MIGRAINE) 355-974-16 MG per tablet Take 1 tablet by mouth every 6 (six) hours as needed for headache.     . cetirizine (ZYRTEC) 10 MG tablet Take 10 mg by mouth daily as needed for allergies.     Marland Kitchen QUEtiapine (SEROQUEL) 50 MG tablet     . sertraline (ZOLOFT) 100 MG tablet Take 1 tablet (100 mg total) by mouth  daily. (Patient not taking: Reported on 02/26/2019) 30 tablet 1  . traZODone (DESYREL) 100 MG tablet      No current facility-administered medications for this visit.     OBJECTIVE: Vitals:   02/26/19 1005  BP: 128/86  Pulse: 92  Resp: 20  Temp: (!) 96.9 F (36.1 C)     Body mass index is 37.68 kg/m.    ECOG FS:0 - Asymptomatic  General: Well-developed, well-nourished, no acute distress. Eyes: Pink conjunctiva, anicteric sclera. HEENT: Normocephalic, moist mucous membranes. Breasts: Patient declined breast exam today. Lungs: Clear to auscultation bilaterally. Heart: Regular rate and rhythm. No rubs, murmurs, or gallops. Abdomen: Soft, nontender, nondistended. No organomegaly noted, normoactive bowel sounds. Musculoskeletal: No edema, cyanosis, or clubbing. Neuro: Alert, answering all questions appropriately. Cranial nerves grossly intact. Skin: No rashes or petechiae noted. Psych: Normal affect.  LAB RESULTS:  Lab Results  Component Value Date   NA 138 06/11/2018   K 2.8 (L) 06/11/2018   CL 102 06/11/2018   CO2 29 06/11/2018   GLUCOSE 108 (H)  06/11/2018   BUN 14 06/11/2018   CREATININE 0.90 06/11/2018   CALCIUM 8.7 (L) 06/11/2018   PROT 7.0 06/11/2018   ALBUMIN 4.0 06/11/2018   AST 30 06/11/2018   ALT 23 06/11/2018   ALKPHOS 57 06/11/2018   BILITOT 0.8 06/11/2018   GFRNONAA >60 06/11/2018   GFRAA >60 06/11/2018    Lab Results  Component Value Date   WBC 6.1 06/11/2018   NEUTROABS 3.8 06/11/2018   HGB 13.2 06/11/2018   HCT 40.9 06/11/2018   MCV 79.6 (L) 06/11/2018   PLT 279 06/11/2018     STUDIES: No results found.  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  PLAN:    1. Pathologic stage Ia ER/PR positive, HER-2/neu not overexpressing adenocarcinoma of the right breast, central portion: Continue tamoxifen for total 5 years completing in December 2021. Her most  recent mammogram on July 31, 2018 was reported as BI-RADS 2.  Repeat mammogram in February 2021.  Return to clinic in 6 months for routine evaluation.  2.  Iron deficiency anemia: Resolved.  I spent a total of 20 minutes face-to-face with the patient of which greater than 50% of the visit was spent in counseling and coordination of care as detailed above.   Patient expressed understanding and was in agreement with this plan. She also understands that She can call clinic at any time with any questions, concerns, or complaints.   Cancer Staging Cancer of central portion of right female breast ([facility]) Staging form: Breast, AJCC 7th Edition - Clinical stage from 03/15/2015: Stage IA (T1a, N0, M0) - Signed by ,  J, MD on 03/15/2015    J , MD   02/26/2019 10:59 AM     

## 2019-02-23 ENCOUNTER — Ambulatory Visit: Payer: Self-pay | Admitting: Oncology

## 2019-02-26 ENCOUNTER — Inpatient Hospital Stay: Payer: Medicare Other | Attending: Oncology | Admitting: Oncology

## 2019-02-26 ENCOUNTER — Encounter: Payer: Self-pay | Admitting: Oncology

## 2019-02-26 ENCOUNTER — Other Ambulatory Visit: Payer: Self-pay

## 2019-02-26 VITALS — BP 128/86 | HR 92 | Temp 96.9°F | Resp 20 | Ht 62.0 in | Wt 206.0 lb

## 2019-02-26 DIAGNOSIS — Z79899 Other long term (current) drug therapy: Secondary | ICD-10-CM | POA: Diagnosis not present

## 2019-02-26 DIAGNOSIS — Z881 Allergy status to other antibiotic agents status: Secondary | ICD-10-CM | POA: Diagnosis not present

## 2019-02-26 DIAGNOSIS — Z882 Allergy status to sulfonamides status: Secondary | ICD-10-CM | POA: Diagnosis not present

## 2019-02-26 DIAGNOSIS — Z8043 Family history of malignant neoplasm of testis: Secondary | ICD-10-CM | POA: Insufficient documentation

## 2019-02-26 DIAGNOSIS — Z170 Estrogen receptor positive status [ER+]: Secondary | ICD-10-CM | POA: Diagnosis not present

## 2019-02-26 DIAGNOSIS — Z888 Allergy status to other drugs, medicaments and biological substances status: Secondary | ICD-10-CM | POA: Insufficient documentation

## 2019-02-26 DIAGNOSIS — Z803 Family history of malignant neoplasm of breast: Secondary | ICD-10-CM | POA: Diagnosis not present

## 2019-02-26 DIAGNOSIS — C50111 Malignant neoplasm of central portion of right female breast: Secondary | ICD-10-CM | POA: Diagnosis not present

## 2019-05-10 ENCOUNTER — Other Ambulatory Visit (HOSPITAL_COMMUNITY): Payer: Self-pay | Admitting: Neurology

## 2019-05-10 ENCOUNTER — Other Ambulatory Visit: Payer: Self-pay | Admitting: Neurology

## 2019-05-10 DIAGNOSIS — G43719 Chronic migraine without aura, intractable, without status migrainosus: Secondary | ICD-10-CM

## 2019-05-24 ENCOUNTER — Ambulatory Visit
Admission: RE | Admit: 2019-05-24 | Discharge: 2019-05-24 | Disposition: A | Discharge status: Home / Self Care | Payer: Medicare Other | Source: Ambulatory Visit | Attending: Neurology | Admitting: Neurology

## 2019-05-24 ENCOUNTER — Other Ambulatory Visit: Payer: Self-pay

## 2019-05-24 DIAGNOSIS — G43719 Chronic migraine without aura, intractable, without status migrainosus: Secondary | ICD-10-CM | POA: Diagnosis not present

## 2019-05-24 LAB — POCT I-STAT CREATININE: Creatinine, Ser: 0.9 mg/dL (ref 0.44–1.00)

## 2019-05-24 MED ORDER — GADOBUTROL 1 MMOL/ML IV SOLN
9.0000 mL | Freq: Once | INTRAVENOUS | Status: AC | PRN
  Administered 2019-05-24: 9 mL via INTRAVENOUS

## 2019-07-22 ENCOUNTER — Other Ambulatory Visit: Payer: Self-pay | Admitting: Oncology

## 2019-07-22 DIAGNOSIS — C50111 Malignant neoplasm of central portion of right female breast: Secondary | ICD-10-CM

## 2019-07-22 DIAGNOSIS — Z170 Estrogen receptor positive status [ER+]: Secondary | ICD-10-CM

## 2019-08-03 ENCOUNTER — Ambulatory Visit
Admission: RE | Admit: 2019-08-03 | Discharge: 2019-08-03 | Disposition: A | Discharge status: Home / Self Care | Payer: Medicare Other | Source: Ambulatory Visit | Attending: Oncology | Admitting: Oncology

## 2019-08-03 DIAGNOSIS — Z170 Estrogen receptor positive status [ER+]: Secondary | ICD-10-CM | POA: Insufficient documentation

## 2019-08-03 DIAGNOSIS — C50111 Malignant neoplasm of central portion of right female breast: Secondary | ICD-10-CM | POA: Insufficient documentation

## 2019-08-09 ENCOUNTER — Ambulatory Visit: Payer: Medicare Other | Admitting: Surgery

## 2019-08-14 NOTE — Progress Notes (Signed)
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  INTERVAL HISTORY: Patient returns to clinic today for routine 56-monthevaluation.  She continues to tolerate tamoxifen well without significant side effects.  She currently feels well and is asymptomatic. She has no neurologic complaints.  She denies any recent fevers or illnesses.  She has a good appetite and denies weight loss.  She denies any chest pain, shortness of breath, cough, or hemoptysis.  She denies any nausea, vomiting, constipation, or diarrhea.  She has no urinary complaints.  Patient offers no specific complaints today.  REVIEW OF SYSTEMS:   Review of Systems  Constitutional: Negative.  Negative for fever, malaise/fatigue and weight loss.  Respiratory: Negative.  Negative for cough and shortness of breath.   Cardiovascular: Negative.  Negative for chest pain and leg swelling.  Gastrointestinal: Negative.  Negative for abdominal pain.  Genitourinary: Negative.  Negative for dysuria.  Musculoskeletal: Negative.  Negative for back pain.  Skin: Negative.  Negative for rash.  Neurological: Negative.  Negative for dizziness, focal weakness, weakness and headaches.  Psychiatric/Behavioral: Negative.  The patient is not nervous/anxious.     As per HPI. Otherwise, a complete review of systems is negative.  PAST MEDICAL HISTORY: Past Medical History:  Diagnosis Date  . Abnormal vaginal bleeding   . [place]   Oncoltype score: 3. 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 Depression   . Edema of both legs   . Fibroids   . GERD (gastroesophageal reflux disease)   . High triglycerides   . Hypertension    RECENTLY TAKEN OFF OF MEDS PER MOM   . Learning disability   . Migraine    daily  . Personal history of radiation therapy   . Seizures ([place]    AS A CHILD-NONE SINCE    PAST SURGICAL HISTORY: Past Surgical History:  Procedure Laterality Date  . ABDOMINAL HYSTERECTOMY    . AXILLARY HIDRADENITIS EXCISION     unc  . AXILLARY SENTINEL NODE BIOPSY Right 02/23/2015   Sentinel lymph node biopsy x4, one with isolated tumor cells.;  [facility];  ISOLATED TUMOR CELLS IN ONE LYMPH NODE (1/1).  .Marland KitchenBREAST BIOPSY Right 01/15/2016   stereo Dr BBary Castilla fat necrosis  . BREAST DUCTAL SYSTEM EXCISION Bilateral 01/26/2015   Bilateral retroareolar ductal excision for bloody nipple drainage, DCIS identified on the right  . BREAST LUMPECTOMY Right 02/06/2015   Invasive mammary carcinoma.  5 mm, DCIS at margin.  Surgeon: SChristene Lye MD;  Location: [facility]S;  Service: General;  Laterality: Right;  . CARPAL TUNNEL RELEASE    . CARPAL TUNNEL RELEASE Right 05/08/2016   Procedure: OPEN CARPAL TUNNEL RELEASE;  Surgeon: JCorky Mull MD;  [facility]  Service: Orthopedics;  Laterality: Right;  . CYSTOSCOPY N/A 11/24/2017   Procedure: CYSTOSCOPY;  Surgeon: BBenjaman Kindler MD;  Location: [facility];  Service: Gynecology;  Laterality: N/A;  . EXCISION / BIOPSY BREAST / NIPPLE / DUCT Bilateral 01/26/2015   left breast negative. right breast positive  .  INGUINAL HIDRADENITIS EXCISION    . LAPAROSCOPIC BILATERAL SALPINGO OOPHERECTOMY Bilateral 11/24/2017   Procedure: LAPAROSCOPIC BILATERAL SALPINGO OOPHORECTOMY;  Surgeon: Benjaman Kindler, MD;  Location: [facility]S;  Service: Gynecology;  Laterality: Bilateral;  . LAPAROSCOPIC HYSTERECTOMY N/A 11/24/2017   Procedure:  HYSTERECTOMY TOTAL LAPAROSCOPIC;  Surgeon: Benjaman Kindler, MD;  Location: [facility];  Service: Gynecology;  Laterality: N/A;    FAMILY HISTORY: Family History  Problem Relation Age of Onset  . Breast cancer Sister 81  . Testicular cancer Father     ADVANCED DIRECTIVES (Y/N):  N  HEALTH MAINTENANCE: Social History   Tobacco Use  . Smoking status: Never Smoker  . Smokeless tobacco: Never Used  Substance Use Topics  . Alcohol use: No    Alcohol/week: 0.0 standard drinks  . Drug use: No     Colonoscopy:  PAP:  Bone density:  Lipid panel:  Allergies  Allergen Reactions  . Sumatriptan Shortness Of Breath  . Cephalexin Nausea And Vomiting  . Chocolate Swelling  . Corn Oil Other (See Comments)    By test. Pt has eaten without issues.  . Peanut Oil Swelling and Other (See Comments)    throat  . Peanut-Containing Drug Products Swelling  . Phenobarbital Other (See Comments)    Hyperactivity, aggitation  . Vilazodone Hcl Nausea Only  . Duloxetine Hcl Anxiety and Other (See Comments)    agitation  . Latex Rash and Other (See Comments)    Bandaids, gloves(when worn)  . Sulfa Antibiotics Rash  . Tape Rash    Current Outpatient Medications  Medication Sig Dispense Refill  . aspirin-acetaminophen-caffeine (EXCEDRIN MIGRAINE) 250-250-65 MG per tablet Take 1 tablet by mouth every 6 (six) hours as needed for headache.     . cetirizine (ZYRTEC) 10 MG tablet Take 10 mg by mouth daily as needed for allergies.     . hydrochlorothiazide (HYDRODIURIL) 12.5 MG tablet Take 1 tablet by mouth daily.    Marland Kitchen losartan (COZAAR) 50 MG tablet Take 1 tablet by mouth daily.    Marland Kitchen OLANZapine (ZYPREXA) 20 MG tablet Take 20 mg by mouth at bedtime.     Marland Kitchen omeprazole (PRILOSEC) 20 MG capsule Take 1 capsule by mouth daily.    . QUEtiapine (SEROQUEL) 50 MG tablet     . rizatriptan (MAXALT-MLT) 10 MG disintegrating tablet     . tamoxifen (NOLVADEX) 20 MG tablet TAKE 1 TABLET BY MOUTH ONCE A DAY 30 tablet  11  . zonisamide (ZONEGRAN) 100 MG capsule Take 3 capsules by mouth daily.    Marland Kitchen albuterol (VENTOLIN HFA) 108 (90 Base) MCG/ACT inhaler Inhale 2 puffs into the lungs every 6 (six) hours as needed.    Vanessa Kick Ethyl (VASCEPA) 1 g CAPS Take 2 g by mouth daily with lunch.    . traZODone (DESYREL) 100 MG tablet      No current facility-administered medications for this visit.    OBJECTIVE: Vitals:   08/20/19 1018  BP: (!) 135/95  Pulse: 90  Resp: 16  Temp: 97.8 F (36.6 C)     Body mass index is 38.01 kg/m.    ECOG FS:0 - Asymptomatic  General: Well-developed, well-nourished, no acute distress. Eyes: Pink conjunctiva, anicteric sclera. HEENT: Normocephalic, moist mucous membranes. Breast: Exam completed by another provider earlier this week. Lungs: No audible wheezing or coughing. Heart: Regular rate and rhythm. Abdomen: Soft, nontender, no obvious distention. Musculoskeletal: No edema, cyanosis, or clubbing. Neuro: Alert, answering all questions appropriately. Cranial nerves grossly intact. Skin: No rashes or  petechiae noted. Psych: Normal affect.  LAB RESULTS:  Lab Results  Component Value Date   NA 138 06/11/2018   K 2.8 (L) 06/11/2018   CL 102 06/11/2018   CO2 29 06/11/2018   GLUCOSE 108 (H) 06/11/2018   BUN 14 06/11/2018   CREATININE 0.90 05/24/2019   CALCIUM 8.7 (L) 06/11/2018   PROT 7.0 06/11/2018   ALBUMIN 4.0 06/11/2018   AST 30 06/11/2018   ALT 23 06/11/2018   ALKPHOS 57 06/11/2018   BILITOT 0.8 06/11/2018   GFRNONAA >60 06/11/2018   GFRAA >60 06/11/2018    Lab Results  Component Value Date   WBC 6.1 06/11/2018   NEUTROABS 3.8 06/11/2018   HGB 13.2 06/11/2018   HCT 40.9 06/11/2018   MCV 79.6 (L) 06/11/2018   PLT 279 06/11/2018     STUDIES: MM DIAG BREAST TOMO BILATERAL  Result Date: 08/03/2019 CLINICAL DATA:  RIGHT lumpectomy in July of 2017. EXAM: DIGITAL DIAGNOSTIC BILATERAL MAMMOGRAM WITH CAD AND TOMO COMPARISON:  Previous exam(s). ACR  Breast Density Category c: The breast tissue is heterogeneously dense, which may obscure small masses. FINDINGS: Postoperative changes are identified in the RIGHT breast. No suspicious mass, distortion, or microcalcifications are identified to suggest presence of malignancy. Mammographic images were processed with CAD. IMPRESSION: No mammographic evidence for malignancy. RECOMMENDATION: Diagnostic mammogram is suggested in 1 year. (Code:DM-B-01Y) I have discussed the findings and recommendations with the patient. If applicable, a reminder letter will be sent to the patient regarding the next appointment. BI-RADS CATEGORY  2: Benign. Electronically Signed   By: Nolon Nations M.D.   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  PLAN:    1. 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  She subsequently completed adjuvant XRT in December 2016.  She continues to have no evidence of disease.  Continue tamoxifen for total of 5 years completing December 2021.  Her most recent mammogram on August 03, 2019 was reported as BI-RADS 2.  Repeat in February 2022.  Return to clinic in 6 months for routine evaluation. 2.  Genetics: Given patient's age of diagnosis, we once again discussed the possibility of genetic testing, but she declined wishing to further discuss with her mother.  Patient reports that her sister had genetic testing with her diagnosis of breast cancer, but she does not know these results.  3.  Iron deficiency anemia: Resolved.   Patient expressed understanding and was in agreement with this plan. She also understands that She can call clinic at any time with any questions, concerns, or complaints.   Cancer Staging Malignant  neoplasm of nipple of right breast in female, estrogen receptor positive ([place]) Staging form: Breast, AJCC 7th Edition - Clinical stage from 03/15/2015: Stage IA (T1a, N0, M0) - Signed by Lloyd Huger, MD on 03/15/2015   Lloyd Huger, MD   08/22/2019 6:47 AM

## 2019-08-19 ENCOUNTER — Other Ambulatory Visit: Payer: Self-pay

## 2019-08-19 DIAGNOSIS — N83201 Unspecified ovarian cyst, right side: Secondary | ICD-10-CM | POA: Insufficient documentation

## 2019-08-19 DIAGNOSIS — L732 Hidradenitis suppurativa: Secondary | ICD-10-CM | POA: Insufficient documentation

## 2019-08-19 DIAGNOSIS — N83202 Unspecified ovarian cyst, left side: Secondary | ICD-10-CM | POA: Insufficient documentation

## 2019-08-19 DIAGNOSIS — N921 Excessive and frequent menstruation with irregular cycle: Secondary | ICD-10-CM | POA: Insufficient documentation

## 2019-08-19 DIAGNOSIS — N939 Abnormal uterine and vaginal bleeding, unspecified: Secondary | ICD-10-CM | POA: Insufficient documentation

## 2019-08-19 DIAGNOSIS — F32A Depression, unspecified: Secondary | ICD-10-CM | POA: Insufficient documentation

## 2019-08-19 DIAGNOSIS — K649 Unspecified hemorrhoids: Secondary | ICD-10-CM | POA: Insufficient documentation

## 2019-08-19 DIAGNOSIS — F329 Major depressive disorder, single episode, unspecified: Secondary | ICD-10-CM | POA: Insufficient documentation

## 2019-08-19 DIAGNOSIS — T7840XA Allergy, unspecified, initial encounter: Secondary | ICD-10-CM | POA: Insufficient documentation

## 2019-08-19 NOTE — Progress Notes (Signed)
Patient prescreened for appointment. Patient has no concerns or questions.  

## 2019-08-20 ENCOUNTER — Ambulatory Visit
Admission: RE | Admit: 2019-08-20 | Discharge: 2019-08-20 | Disposition: A | Discharge status: Home / Self Care | Payer: Medicare Other | Source: Ambulatory Visit | Attending: Radiation Oncology | Admitting: Radiation Oncology

## 2019-08-20 ENCOUNTER — Inpatient Hospital Stay: Payer: Medicare Other | Attending: Oncology | Admitting: Oncology

## 2019-08-20 ENCOUNTER — Other Ambulatory Visit: Payer: Self-pay

## 2019-08-20 VITALS — BP 135/95 | HR 90 | Temp 97.8°F | Resp 16 | Wt 207.8 lb

## 2019-08-20 DIAGNOSIS — Z923 Personal history of irradiation: Secondary | ICD-10-CM | POA: Diagnosis not present

## 2019-08-20 DIAGNOSIS — F329 Major depressive disorder, single episode, unspecified: Secondary | ICD-10-CM | POA: Insufficient documentation

## 2019-08-20 DIAGNOSIS — Z170 Estrogen receptor positive status [ER+]: Secondary | ICD-10-CM | POA: Insufficient documentation

## 2019-08-20 DIAGNOSIS — Z6838 Body mass index (BMI) 38.0-38.9, adult: Secondary | ICD-10-CM | POA: Insufficient documentation

## 2019-08-20 DIAGNOSIS — C50111 Malignant neoplasm of central portion of right female breast: Secondary | ICD-10-CM | POA: Insufficient documentation

## 2019-08-20 DIAGNOSIS — Z79810 Long term (current) use of selective estrogen receptor modulators (SERMs): Secondary | ICD-10-CM | POA: Diagnosis not present

## 2019-08-20 DIAGNOSIS — Z79899 Other long term (current) drug therapy: Secondary | ICD-10-CM | POA: Insufficient documentation

## 2019-08-20 DIAGNOSIS — Z803 Family history of malignant neoplasm of breast: Secondary | ICD-10-CM | POA: Insufficient documentation

## 2019-08-20 DIAGNOSIS — Z8043 Family history of malignant neoplasm of testis: Secondary | ICD-10-CM | POA: Insufficient documentation

## 2019-08-20 NOTE — Progress Notes (Signed)
Radiation Oncology Follow up Note  Name: Karen Beard   Date:   08/20/2019 MRN:  [id number] DOB: [birth year]    This 49 y.o. female presents to the clinic today for close to 5 years out status post whole breast radiation to her right breast for stage I invasive mammary carcinoma.  REFERRING PROVIDER: Maryland Pink, MD  HPI: Patient is a 49 year old female now out close to 5 years having completed whole breast radiation to her right breast for stage I invasive mammary carcinoma.  She is seen today in routine follow-up is doing well.  She specifically denies breast tenderness cough or bone pain..  She had mammograms last month which were BI-RADS 2 benign.  Which I have reviewed.  She is currently on tamoxifen tolerating that well without side effect.  COMPLICATIONS OF TREATMENT: none  FOLLOW UP COMPLIANCE: keeps appointments   PHYSICAL EXAM:  LMP 11/10/2017  Lungs are clear to A&P cardiac examination essentially unremarkable with regular rate and rhythm. No dominant mass or nodularity is noted in either breast in 2 positions examined. Incision is well-healed. No axillary or supraclavicular adenopathy is appreciated. Cosmetic result is good to fair.  She does have some significant shrinkage of the right breast.  Well-developed well-nourished patient in NAD. HEENT reveals PERLA, EOMI, discs not visualized.  Oral cavity is clear. No oral mucosal lesions are identified. Neck is clear without evidence of cervical or supraclavicular adenopathy. Lungs are clear to A&P. Cardiac examination is essentially unremarkable with regular rate and rhythm without murmur rub or thrill. Abdomen is benign with no organomegaly or masses noted. Motor sensory and DTR levels are equal and symmetric in the upper and lower extremities. Cranial nerves II through XII are grossly intact. Proprioception is intact. No peripheral adenopathy or edema is identified. No motor or sensory levels are noted. Crude visual fields are  within normal range.  RADIOLOGY RESULTS: Mammograms reviewed compatible with above-stated findings  PLAN: Present time patient is doing well close to 5 years out with no evidence of disease at this time I am discontinuing follow-up care.  I have asked her to be followed by Dr. Tollie Pizza as well as other medical providers.  Would be happy to reevaluate her at any time in the future should radiation therapy be indicated.  I would like to take this opportunity to thank you for allowing me to participate in the care of your patient.Noreene Filbert, MD

## 2019-08-20 NOTE — Progress Notes (Signed)
Pt in for follow up, states RN called for assessment via phone yesterday.  Pt states no changes or concerns today.

## 2019-12-29 IMAGING — MG DIGITAL DIAGNOSTIC BILATERAL MAMMOGRAM WITH TOMO AND CAD
9 series · 9 of 25 positions shown · non-contrast
Comparison: Previous exam(s).

CLINICAL DATA: History of right breast cancer status post
lumpectomy in 4108.

EXAM:
DIGITAL DIAGNOSTIC BILATERAL MAMMOGRAM WITH CAD AND TOMO

[R CC]
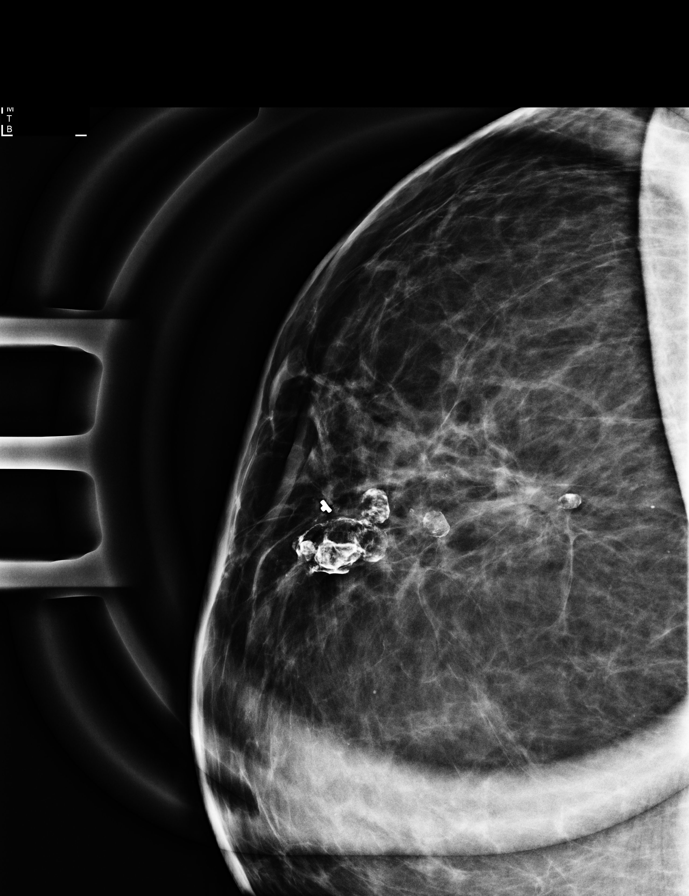

[L MLO synth-2D]
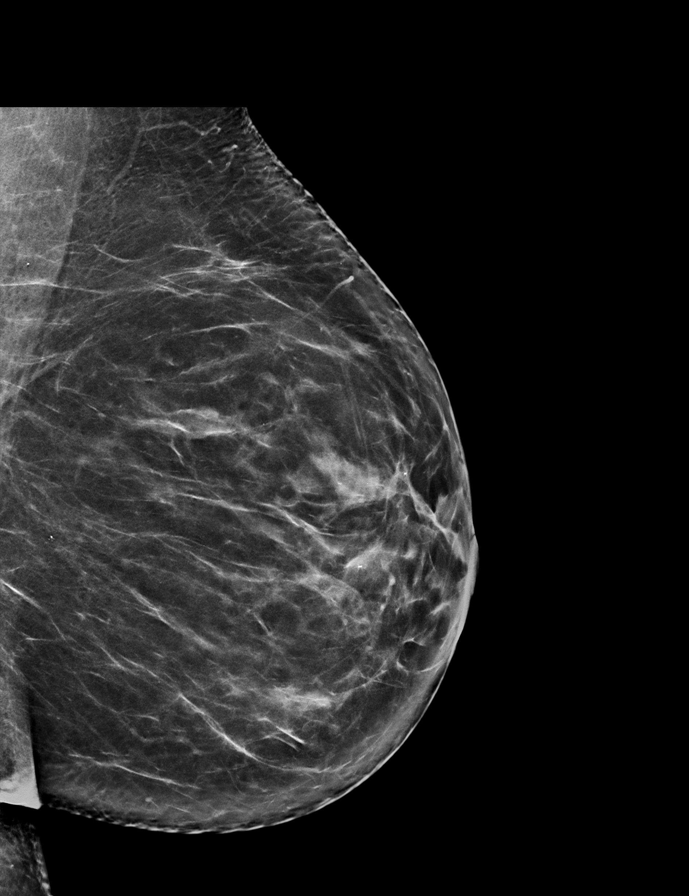

[R MLO synth-2D]
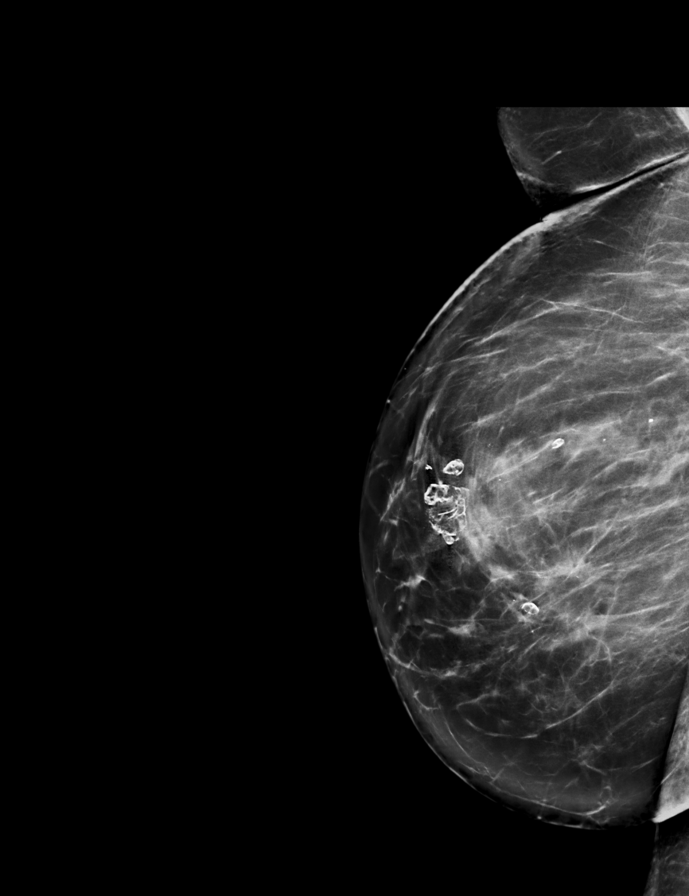

[L CC synth-2D]
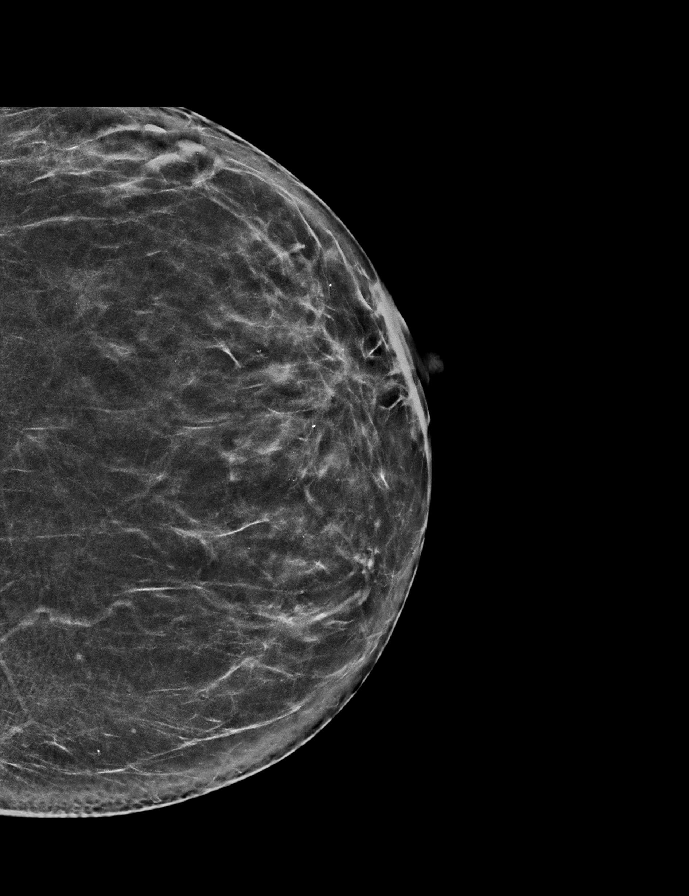

[R CC synth-2D]
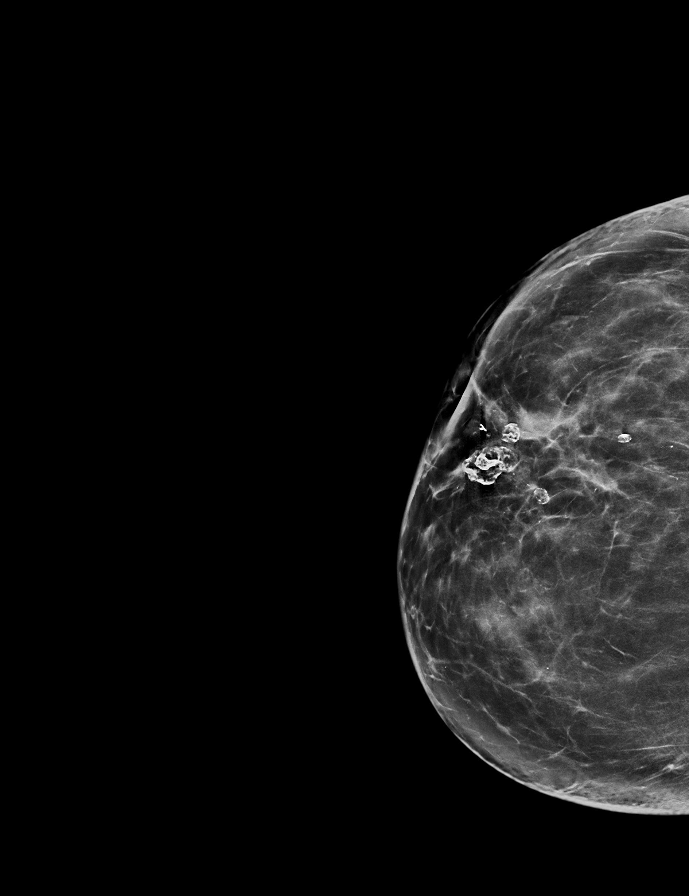

[L MLO tomo · tomo slice 37/74.0]
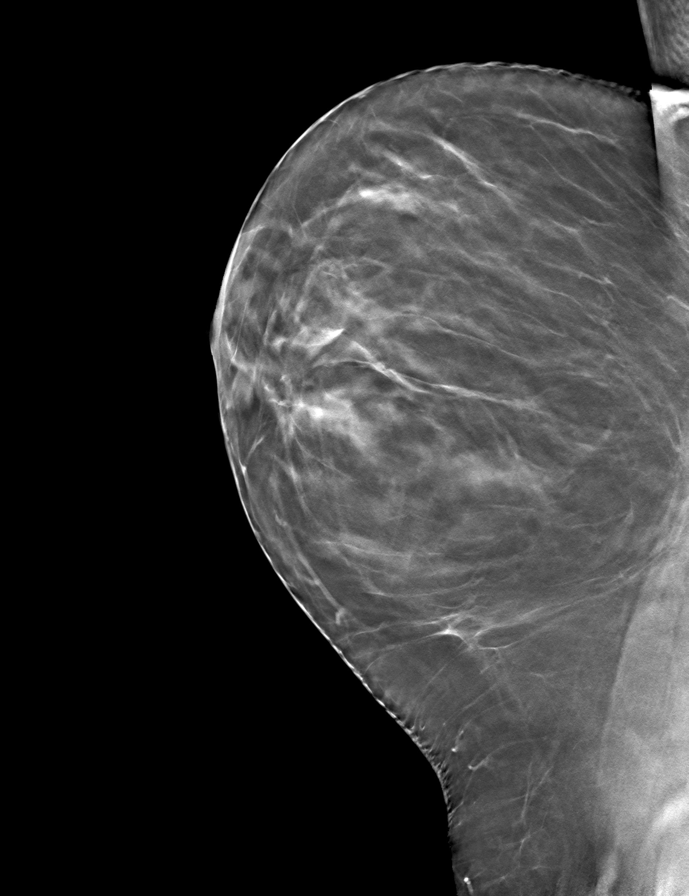

[L CC tomo · tomo slice 33/65.0]
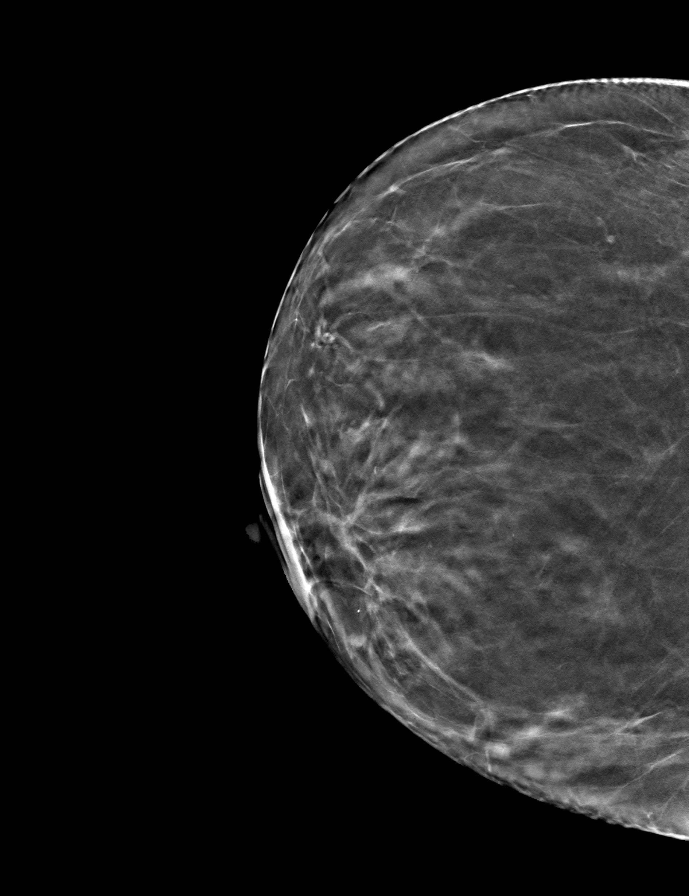

[R MLO tomo · tomo slice 43/85.0]
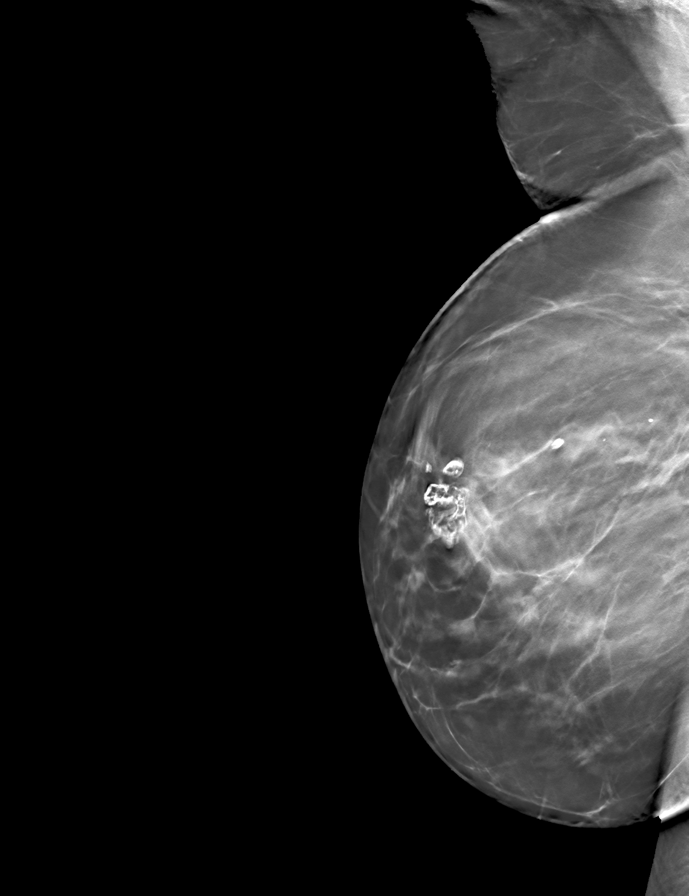

[R CC tomo · tomo slice 39/76.0]
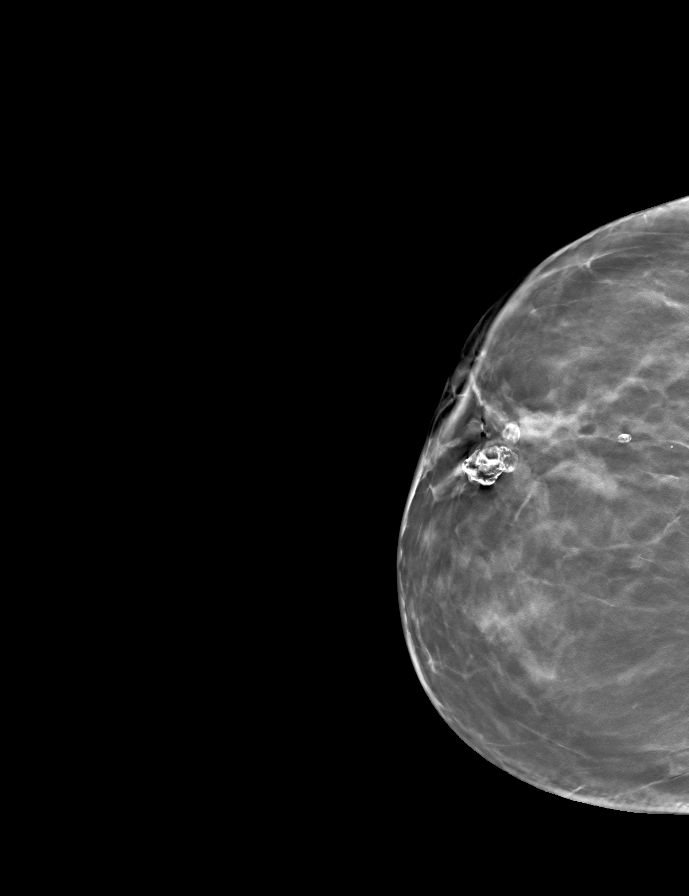

[9 of 25 positions shown; findings below may reference images not displayed]

ACR Breast Density Category b: There are scattered areas of
fibroglandular density.
FINDINGS: Stable lumpectomy changes are seen in the right breast. No
suspicious mass or malignant type microcalcifications identified in
either breast.

Mammographic images were processed with CAD.
IMPRESSION: No evidence of malignancy in either breast.

RECOMMENDATION:
Bilateral diagnostic mammogram in 1 year is recommended.

I have discussed the findings and recommendations with the patient.
Results were also provided in writing at the conclusion of the
visit. If applicable, a reminder letter will be sent to the patient
regarding the next appointment.

BI-RADS CATEGORY  2: Benign.

## 2020-02-26 NOTE — Progress Notes (Signed)
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  INTERVAL HISTORY: Patient returns to clinic today for routine 27-month evaluation.  She is accompanied by her mother.  She is having a flare of her seasonal allergies, but takes Zyrtec for symptomatic relief.  She otherwise feels well and is asymptomatic.  She continues to tolerate tamoxifen without significant side effects. She has no neurologic complaints.  She denies any recent fevers or illnesses.  She has a good appetite and denies weight loss.  She denies any chest pain, shortness of breath, cough, or hemoptysis.  She denies any nausea, vomiting, constipation, or diarrhea.  She has no urinary complaints.  Patient offers no further specific complaints today.   REVIEW OF SYSTEMS:   Review of Systems  Constitutional: Negative.  Negative for fever, malaise/fatigue and weight loss.  HENT: Positive for congestion.   Respiratory: Negative.  Negative for cough and shortness of breath.   Cardiovascular: Negative.  Negative for chest pain and leg swelling.  Gastrointestinal: Negative.  Negative for abdominal pain.  Genitourinary: Negative.  Negative for dysuria.  Musculoskeletal: Negative.  Negative for back pain.  Skin: Negative.  Negative for rash.  Neurological: Negative.  Negative for dizziness, focal weakness, weakness and headaches.  Psychiatric/Behavioral: Negative.  The patient is not nervous/anxious.     As per HPI. Otherwise, a complete review of systems is negative.  PAST MEDICAL HISTORY: Past Medical History:  Diagnosis  Date  . Abnormal vaginal bleeding   . Breast cancer ([place]) 01/26/2015   Oncoltype score: 3. 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 Depression   . Edema of both legs   . Fibroids   . GERD (gastroesophageal reflux disease)   . High triglycerides   . Hypertension    RECENTLY TAKEN OFF OF MEDS PER MOM   . Learning disability   . Migraine    daily  . Personal history of radiation therapy   . Seizures ([place])    AS A CHILD-NONE SINCE    PAST SURGICAL HISTORY: Past Surgical History:  Procedure Laterality Date  . ABDOMINAL HYSTERECTOMY    . AXILLARY HIDRADENITIS EXCISION     unc  . AXILLARY SENTINEL NODE BIOPSY Right 02/23/2015   Sentinel lymph node biopsy x4, one with isolated tumor cells.;  Dr Jamal Collin, MD;  [facility];  ISOLATED TUMOR CELLS IN ONE LYMPH NODE (1/1).  Marland Kitchen BREAST BIOPSY Right 01/15/2016   stereo Dr Bary Castilla, fat necrosis  . BREAST DUCTAL SYSTEM EXCISION Bilateral 01/26/2015   Bilateral retroareolar ductal excision for bloody nipple drainage, DCIS identified on the right  . BREAST LUMPECTOMY Right 02/06/2015   Invasive mammary carcinoma.  5 mm, DCIS at margin.  Surgeon: Christene Lye, MD;  Location: [facility];  Service: General;  Laterality: Right;  . CARPAL TUNNEL RELEASE    . CARPAL TUNNEL RELEASE Right 05/08/2016   Procedure: OPEN CARPAL TUNNEL RELEASE;  Surgeon: Corky Mull, MD;  Location: [place];  Service: Orthopedics;  Laterality: Right;  . CYSTOSCOPY N/A 11/24/2017   Procedure: CYSTOSCOPY;  Surgeon: Benjaman Kindler, MD;  Location: [facility];  Service: Gynecology;  Laterality: N/A;  . EXCISION / BIOPSY BREAST / NIPPLE / DUCT Bilateral 01/26/2015   left breast negative. right breast positive  . INGUINAL HIDRADENITIS EXCISION    . LAPAROSCOPIC BILATERAL SALPINGO OOPHERECTOMY Bilateral 11/24/2017   Procedure: LAPAROSCOPIC BILATERAL SALPINGO OOPHORECTOMY;   Surgeon: Christeen Douglas, MD;  Location: [facility];  Service: Gynecology;  Laterality: Bilateral;  . LAPAROSCOPIC HYSTERECTOMY N/A 11/24/2017   Procedure: HYSTERECTOMY TOTAL LAPAROSCOPIC;  Surgeon: Christeen Douglas, MD;  Location: [facility];  Service: Gynecology;  Laterality: N/A;    FAMILY HISTORY: Family History  Problem Relation Age of Onset  . Breast cancer Sister 74  . Testicular cancer Father     ADVANCED DIRECTIVES (Y/N):  N  HEALTH MAINTENANCE: Social History   Tobacco Use  . Smoking status: Never Smoker  . Smokeless tobacco: Never Used  Vaping Use  . Vaping Use: Never used  Substance Use Topics  . Alcohol use: No    Alcohol/week: 0.0 standard drinks  . Drug use: No     Colonoscopy:  PAP:  Bone density:  Lipid panel:  Allergies  Allergen Reactions  . Sumatriptan Shortness Of Breath  . Cephalexin Nausea And Vomiting  . Chocolate Swelling  . Corn Oil Other (See Comments)    By test. Pt has eaten without issues.  . Peanut Oil Swelling and Other (See Comments)    throat  . Peanut-Containing Drug Products Swelling  . Phenobarbital Other (See Comments)    Hyperactivity, aggitation  . Vilazodone Hcl Nausea Only  . Duloxetine Hcl Anxiety and Other (See Comments)    agitation  . Latex Rash and Other (See Comments)    Bandaids, gloves(when worn)  . Sulfa Antibiotics Rash  . Tape Rash    Current Outpatient Medications  Medication Sig Dispense Refill  . albuterol (VENTOLIN HFA) 108 (90 Base) MCG/ACT inhaler Inhale 2 puffs into the lungs every 6 (six) hours as needed.    Marland Kitchen aspirin-acetaminophen-caffeine (EXCEDRIN MIGRAINE) 250-250-65 MG per tablet Take 1 tablet by mouth every 6 (six) hours as needed for headache.     . cetirizine (ZYRTEC) 10 MG tablet Take 10 mg by mouth daily as needed for allergies.     . hydrochlorothiazide (HYDRODIURIL) 12.5 MG tablet Take 1 tablet by mouth daily.    Bess Harvest Ethyl (VASCEPA) 1 g CAPS Take 2 g by mouth daily with lunch.      . losartan (COZAAR) 50 MG tablet Take 1 tablet by mouth daily.    Marland Kitchen OLANZapine (ZYPREXA) 20 MG tablet Take 20 mg by mouth at bedtime.     Marland Kitchen omeprazole (PRILOSEC) 20 MG capsule Take 1 capsule by mouth daily.    . QUEtiapine (SEROQUEL) 50 MG tablet     . rizatriptan (MAXALT-MLT) 10 MG disintegrating tablet     . tamoxifen (NOLVADEX) 20 MG tablet TAKE 1 TABLET BY MOUTH ONCE A DAY 30 tablet 11  . traZODone (DESYREL) 100 MG tablet     . zonisamide (ZONEGRAN) 100 MG capsule Take 3 capsules by mouth daily.     No current facility-administered medications for this visit.    OBJECTIVE: Vitals:   02/29/20 1040  BP: (!) 132/103  Pulse: 90  Resp: 20  Temp: 98.7 F (37.1 C)  SpO2: 99%     Body mass index is 35.21 kg/m.    ECOG FS:0 - Asymptomatic  General: Well-developed, well-nourished, no acute distress. Eyes: Pink conjunctiva, anicteric sclera. HEENT: Normocephalic, moist mucous membranes. Breast: Exam  deferred today. Lungs: No audible wheezing or coughing. Heart: Regular rate and rhythm. Abdomen: Soft, nontender, no obvious distention. Musculoskeletal: No edema, cyanosis, or clubbing. Neuro: Alert, answering all questions appropriately. Cranial nerves grossly intact. Skin: No rashes or petechiae noted. Psych: Normal affect.  LAB RESULTS:  Lab Results  Component Value Date   NA 138 06/11/2018   K 2.8 (L) 06/11/2018   CL 102 06/11/2018   CO2 29 06/11/2018   GLUCOSE 108 (H) 06/11/2018   BUN 14 06/11/2018   CREATININE 0.90 05/24/2019   CALCIUM 8.7 (L) 06/11/2018   PROT 7.0 06/11/2018   ALBUMIN 4.0 06/11/2018   AST 30 06/11/2018   ALT 23 06/11/2018   ALKPHOS 57 06/11/2018   BILITOT 0.8 06/11/2018   GFRNONAA >60 06/11/2018   GFRAA >60 06/11/2018    Lab Results  Component Value Date   WBC 6.1 06/11/2018   NEUTROABS 3.8 06/11/2018   HGB 13.2 06/11/2018   HCT 40.9 06/11/2018   MCV 79.6 (L) 06/11/2018   PLT 279 06/11/2018     STUDIES: No results  found.  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  PLAN:    1. 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  She subsequently completed adjuvant XRT in December 2016.  She continues to have no evidence of disease.  Her most recent mammogram on August 03, 2019 was reported as BI-RADS 2.  Repeat in February 2022.  Patient has been instructed to continue tamoxifen until her next clinic visit in February 2022.  At that point she can discontinue treatment and be transitioned to yearly visits.  2.  Genetics: Given patient's age of diagnosis, we once again discussed the possibility of genetic testing, but she declined wishing to further discuss with her mother.  Patient reports that her sister had genetic testing with her diagnosis of breast cancer, but she does not know these results.  3.  Iron deficiency anemia: Resolved.  I spent a total of 20 minutes reviewing chart data, face-to-face evaluation with the patient, counseling and coordination of care as detailed above.    Patient expressed understanding and was in agreement with this plan. She also understands that She can call clinic at any time with any questions, concerns, or complaints.   Cancer Staging Malignant neoplasm of nipple of right breast in female, estrogen receptor positive ([place]) Staging form: Breast, AJCC 7th Edition - Clinical stage from 03/15/2015: Stage IA (T1a, N0, M0) - Signed by Lloyd Huger, MD on 03/15/2015   Lloyd Huger, MD   02/29/2020 11:06 AM

## 2020-02-28 ENCOUNTER — Encounter: Payer: Self-pay | Admitting: Oncology

## 2020-02-29 ENCOUNTER — Other Ambulatory Visit: Payer: Self-pay

## 2020-02-29 ENCOUNTER — Inpatient Hospital Stay: Payer: Medicare Other | Attending: Oncology | Admitting: Oncology

## 2020-02-29 ENCOUNTER — Encounter: Payer: Self-pay | Admitting: Oncology

## 2020-02-29 VITALS — BP 132/103 | HR 90 | Temp 98.7°F | Resp 20 | Wt 192.5 lb

## 2020-02-29 DIAGNOSIS — Z170 Estrogen receptor positive status [ER+]: Secondary | ICD-10-CM | POA: Insufficient documentation

## 2020-02-29 DIAGNOSIS — C50011 Malignant neoplasm of nipple and areola, right female breast: Secondary | ICD-10-CM | POA: Insufficient documentation

## 2020-02-29 DIAGNOSIS — Z923 Personal history of irradiation: Secondary | ICD-10-CM | POA: Insufficient documentation

## 2020-02-29 DIAGNOSIS — Z803 Family history of malignant neoplasm of breast: Secondary | ICD-10-CM | POA: Insufficient documentation

## 2020-02-29 DIAGNOSIS — Z888 Allergy status to other drugs, medicaments and biological substances status: Secondary | ICD-10-CM | POA: Diagnosis not present

## 2020-02-29 DIAGNOSIS — Z881 Allergy status to other antibiotic agents status: Secondary | ICD-10-CM | POA: Diagnosis not present

## 2020-02-29 DIAGNOSIS — R0981 Nasal congestion: Secondary | ICD-10-CM | POA: Insufficient documentation

## 2020-02-29 DIAGNOSIS — Z882 Allergy status to sulfonamides status: Secondary | ICD-10-CM | POA: Insufficient documentation

## 2020-02-29 DIAGNOSIS — C50111 Malignant neoplasm of central portion of right female breast: Secondary | ICD-10-CM | POA: Diagnosis not present

## 2020-02-29 DIAGNOSIS — Z79899 Other long term (current) drug therapy: Secondary | ICD-10-CM | POA: Diagnosis not present

## 2020-02-29 DIAGNOSIS — F329 Major depressive disorder, single episode, unspecified: Secondary | ICD-10-CM | POA: Insufficient documentation

## 2020-02-29 DIAGNOSIS — Z90722 Acquired absence of ovaries, bilateral: Secondary | ICD-10-CM | POA: Insufficient documentation

## 2020-02-29 DIAGNOSIS — Z8049 Family history of malignant neoplasm of other genital organs: Secondary | ICD-10-CM | POA: Insufficient documentation

## 2020-02-29 NOTE — Progress Notes (Signed)
Patient denies any pain or concerns at this time. BP was elevated today but patient admits she has not taken medication

## 2020-08-03 ENCOUNTER — Other Ambulatory Visit: Payer: Self-pay

## 2020-08-03 ENCOUNTER — Ambulatory Visit
Admission: RE | Admit: 2020-08-03 | Discharge: 2020-08-03 | Disposition: A | Discharge status: Home / Self Care | Payer: Medicare Other | Source: Ambulatory Visit | Attending: Oncology | Admitting: Oncology

## 2020-08-03 DIAGNOSIS — C50111 Malignant neoplasm of central portion of right female breast: Secondary | ICD-10-CM | POA: Diagnosis present

## 2020-08-03 DIAGNOSIS — Z170 Estrogen receptor positive status [ER+]: Secondary | ICD-10-CM | POA: Diagnosis present

## 2020-08-29 ENCOUNTER — Encounter: Payer: Self-pay | Admitting: Oncology

## 2021-07-04 ENCOUNTER — Other Ambulatory Visit: Payer: Self-pay | Admitting: General Surgery

## 2021-07-04 DIAGNOSIS — Z1231 Encounter for screening mammogram for malignant neoplasm of breast: Secondary | ICD-10-CM

## 2021-08-07 ENCOUNTER — Ambulatory Visit
Admission: RE | Admit: 2021-08-07 | Discharge: 2021-08-07 | Disposition: A | Discharge status: Home / Self Care | Payer: Medicare Other | Source: Ambulatory Visit | Attending: General Surgery | Admitting: General Surgery

## 2021-08-07 ENCOUNTER — Other Ambulatory Visit: Payer: Self-pay

## 2021-08-07 DIAGNOSIS — Z1231 Encounter for screening mammogram for malignant neoplasm of breast: Secondary | ICD-10-CM | POA: Diagnosis not present

## 2021-11-02 ENCOUNTER — Encounter: Payer: Self-pay | Admitting: Oncology
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
## 2021-11-09 ENCOUNTER — Telehealth (INDEPENDENT_AMBULATORY_CARE_PROVIDER_SITE_OTHER): Payer: Medicare Other | Admitting: Psychiatry

## 2021-11-09 ENCOUNTER — Encounter: Payer: Self-pay | Admitting: Psychiatry

## 2021-11-09 DIAGNOSIS — F70 Mild intellectual disabilities: Secondary | ICD-10-CM

## 2021-11-09 DIAGNOSIS — F203 Undifferentiated schizophrenia: Secondary | ICD-10-CM | POA: Diagnosis not present

## 2021-11-09 MED ORDER — ARIPIPRAZOLE 15 MG PO TABS: 15.0000 mg | ORAL_TABLET | Freq: Every day | ORAL | 0 refills | Status: DC

## 2021-11-09 MED ORDER — ARIPIPRAZOLE 10 MG PO TABS: ORAL_TABLET | ORAL | 0 refills | Status: DC

## 2021-11-09 NOTE — Patient Instructions (Signed)
Start Abilify 5 mg at night for 3 days, then 10 mg at night for 3 days, then 15 mg at night Next appointment: 6/22 at 8:30, in person

## 2021-12-03 NOTE — Progress Notes (Signed)
BH MD/PA/NP OP Progress Note  12/06/2021 9:10 AM Karen Beard  MRN:  [id number]  Chief Complaint:  Chief Complaint  Patient presents with   Follow-up   HPI:  This is a follow-up appointment for schizophrenia.  Karen Beard, her sister provides majority of the history.  Karen Beard states that she has been feeling depressed.  She is concerned about her mother, and takes care of her (her mother is in her 28's, and had COVID, UTI. She fell a few times. She is currently in palliative care. )  She enjoys use of a puzzle, and taking care of her dog.  She also enjoys taking a walk with her neighbor.  She has cut down snack, and has lost some weight.  She has other depressive symptoms as in PHQ-9.  She denies SI.  She denies hallucinations, paranoia. At the end of the interview, Karen Beard states that she appreciates the care, stating that she feels better, and smiles.  Karen Beard states that Karen Beard has been doing better since being on the medication.  Niyoka has been able to take care of her medication in the pil box.  She used to be more lethargic when she was on combination of Abilfy injection, Benadryl, and some antidepressant, and did not initiate any.  She seems to be better in that aspect.  Karen Beard is not aware of any significant anxiety.  Both Karen Beard and Karen Beard feels comfortable to stay on oral medication at this time.  Wt Readings from Last 3 Encounters:  12/06/21 175 lb 9.6 oz (79.7 kg)  02/29/20 192 lb 8 oz (87.3 kg)  08/20/19 207 lb 12.8 oz (94.3 kg)       Visit Diagnosis:    ICD-10-CM   1. 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 Intellectual developmental disorder, mild  F70     3. MDD (major depressive disorder), recurrent episode, mild (HCC)  F33.0       Past Psychiatric History: Please see initial evaluation for full details. I have reviewed the history. No updates at this time.     Past Medical History:  Past Medical History:  Diagnosis Date   Abnormal  vaginal bleeding    Breast cancer (HCC) 01/26/2015   Oncoltype score: 3. T1a,No (isolated tumor cells_right breast lumpectomy with rad tx, ER/PR pos, her2 negative, INVASIVE MAMMARY CARCINOMA and DCIS of right breast   Depression    Edema of both legs    Fibroids    GERD (gastroesophageal reflux disease)    High triglycerides    Hypertension    RECENTLY TAKEN OFF OF MEDS PER MOM    Learning disability    Migraine    daily   Personal history of radiation therapy    Seizures (HCC)    AS A CHILD-NONE SINCE    Past Surgical History:  Procedure Laterality Date   ABDOMINAL HYSTERECTOMY     AXILLARY HIDRADENITIS EXCISION     [facility]   AXILLARY SENTINEL NODE BIOPSY Right 02/23/2015   Sentinel lymph node biopsy x4, one with isolated tumor cells.;  Dr Karen Cristal, MD;  [facility];  ISOLATED TUMOR CELLS IN ONE LYMPH NODE (1/1).   BREAST BIOPSY Right 01/15/2016   stereo Dr Karen Beard, fat necrosis   BREAST DUCTAL SYSTEM EXCISION Bilateral 01/26/2015   Bilateral retroareolar ductal excision for bloody nipple drainage, DCIS identified on the right   BREAST LUMPECTOMY Right 02/06/2015   Invasive mammary carcinoma.  5 mm, DCIS at margin.  Surgeon: Karen Brightly, MD;  Location: [facility];  Service: General;  Laterality: Right;   CARPAL TUNNEL RELEASE     CARPAL TUNNEL RELEASE Right 05/08/2016   Procedure: OPEN CARPAL TUNNEL RELEASE;  Surgeon: Karen Flake, MD;  Location: [facility];  Service: Orthopedics;  Laterality: Right;   CYSTOSCOPY N/A 11/24/2017   Procedure: CYSTOSCOPY;  Surgeon: Karen Douglas, MD;  Location: [facility];  Service: Gynecology;  Laterality: N/A;   EXCISION / BIOPSY BREAST / NIPPLE / DUCT Bilateral 01/26/2015   left breast duct ecstasis. right breast DCIS   INGUINAL HIDRADENITIS EXCISION     LAPAROSCOPIC BILATERAL SALPINGO OOPHERECTOMY Bilateral 11/24/2017   Procedure: LAPAROSCOPIC BILATERAL SALPINGO OOPHORECTOMY;  Surgeon: Karen Douglas, MD;  Location: [facility];  Service:  Gynecology;  Laterality: Bilateral;   LAPAROSCOPIC HYSTERECTOMY N/A 11/24/2017   Procedure: HYSTERECTOMY TOTAL LAPAROSCOPIC;  Surgeon: Karen Douglas, MD;  Location: [facility];  Service: Gynecology;  Laterality: N/A;    Family Psychiatric History: Please see initial evaluation for full details. I have reviewed the history. No updates at this time.     Family History:  Family History  Problem Relation Age of Onset   Breast cancer Sister 48   Testicular cancer Father     Social History:  Social History   Socioeconomic History   Marital status: Single    Spouse name: Not on file   Number of children: Not on file   Years of education: Not on file   Highest education level: Not on file  Occupational History   Not on file  Tobacco Use   Smoking status: Never   Smokeless tobacco: Never  Vaping Use   Vaping Use: Never used  Substance and Sexual Activity   Alcohol use: No    Alcohol/week: 0.0 standard drinks of alcohol   Drug use: No   Sexual activity: Not on file  Other Topics Concern   Not on file  Social History Narrative   Not on file   Social Determinants of Health   Financial Resource Strain: Not on file  Food Insecurity: Not on file  Transportation Needs: Not on file  Physical Activity: Not on file  Stress: Not on file  Social Connections: Not on file    Allergies:  Allergies  Allergen Reactions   Sumatriptan Shortness Of Breath   Cephalexin Nausea And Vomiting   Chocolate Swelling   Corn Oil Other (See Comments)    By test. Pt has eaten without issues.   Peanut Oil Swelling and Other (See Comments)    throat   Peanut-Containing Drug Products Swelling   Phenobarbital Other (See Comments)    Hyperactivity, aggitation   Vilazodone Hcl Nausea Only   Duloxetine Hcl Anxiety and Other (See Comments)    agitation   Latex Rash and Other (See Comments)    Bandaids, gloves(when worn)   Sulfa Antibiotics Rash   Tape Rash    Metabolic Disorder Labs: Lab  Results  Component Value Date   HGBA1C 5.1 06/12/2018   MPG 99.67 06/12/2018   MPG 100 03/13/2016   No results found for: "PROLACTIN" Lab Results  Component Value Date   CHOL 175 06/12/2018   TRIG 270 (H) 06/12/2018   HDL 31 (L) 06/12/2018   CHOLHDL 5.6 06/12/2018   VLDL 54 (H) 06/12/2018   LDLCALC 90 06/12/2018   LDLCALC 109 (H) 03/13/2016   Lab Results  Component Value Date   TSH 1.721 06/12/2018   TSH 1.864 03/13/2016    Therapeutic Level Labs: No results found for: "  LITHIUM" No results found for: "VALPROATE" No results found for: "CBMZ"  Current Medications: Current Outpatient Medications  Medication Sig Dispense Refill   hydrochlorothiazide (MICROZIDE) 12.5 MG capsule TAKE 1 CAPSULE BY MOUTH ONCE DAILY. **TAKE WITH LOSARTAN 50 MG TABLET**     sertraline (ZOLOFT) 25 MG tablet Take 1 tablet (25 mg total) by mouth at bedtime. 30 tablet 1   ABILIFY MAINTENA 400 MG PRSY prefilled syringe SMARTSIG:1 Each IM Once a Month     albuterol (VENTOLIN HFA) 108 (90 Base) MCG/ACT inhaler Inhale 2 puffs into the lungs every 6 (six) hours as needed.     ARIPiprazole (ABILIFY) 10 MG tablet Take 0.5 tablets (5 mg total) by mouth daily for 3 days, THEN 1 tablet (10 mg total) daily for 3 days. 5 tablet 0   ARIPiprazole (ABILIFY) 15 MG tablet Take 1 tablet (15 mg total) by mouth at bedtime. 30 tablet 1   aspirin-acetaminophen-caffeine (EXCEDRIN MIGRAINE) 250-250-65 MG per tablet Take 1 tablet by mouth every 6 (six) hours as needed for headache.      cetirizine (ZYRTEC) 10 MG tablet Take 10 mg by mouth daily as needed for allergies.      Icosapent Ethyl (VASCEPA) 1 g CAPS Take 2 g by mouth daily with lunch.     losartan (COZAAR) 50 MG tablet Take 1 tablet by mouth daily.     omeprazole (PRILOSEC) 20 MG capsule Take 1 capsule by mouth daily.     rizatriptan (MAXALT-MLT) 10 MG disintegrating tablet      zonisamide (ZONEGRAN) 100 MG capsule Take 3 capsules by mouth daily.     No current  facility-administered medications for this visit.     Musculoskeletal: Strength & Muscle Tone: within normal limits Gait & Station: normal Patient leans: N/A  Psychiatric Specialty Exam: Review of Systems  Psychiatric/Behavioral:  Positive for decreased concentration and dysphoric mood. Negative for agitation, behavioral problems, confusion, hallucinations, self-injury, sleep disturbance and suicidal ideas. The patient is not nervous/anxious and is not hyperactive.   All other systems reviewed and are negative.   Blood pressure (!) 159/99, pulse (!) 114, temperature 97.9 F (36.6 C), temperature source Oral, weight 175 lb 9.6 oz (79.7 kg), last menstrual period 11/10/2017.Body mass index is 32.12 kg/m.  General Appearance: Fairly Groomed  Eye Contact:  Good  Speech:  Clear and Coherent  Volume:  Normal  Mood:  Depressed  Affect:  Appropriate, Congruent, and blunted, smiles at the end  Thought Process:  Coherent  Orientation:  Full (Time, Place, and Person)  Thought Content: Logical   Suicidal Thoughts:  No  Homicidal Thoughts:  No  Memory:  Immediate;   Good  Judgement:  Good  Insight:  Present  Psychomotor Activity:  Normal, no tremors, no rigidity  Concentration:  Concentration: Good and Attention Span: Good  Recall:  Good  Fund of Knowledge: Good  Language: Good  Akathisia:  No  Handed:  Right  AIMS (if indicated): not done  Assets:  Communication Skills Desire for Improvement  ADL's:  Intact  Cognition: WNL  Sleep:  Fair   Screenings: AUDIT    Flowsheet Row Admission (Discharged) from 06/12/2018 in [facility] Admission (Discharged) from 03/12/2016 in [facility]INE  Alcohol Use Disorder Identification Test Final Score (AUDIT) 0 0      PHQ2-9    Flowsheet Row Office Visit from 12/06/2021 in [facility] Follow Up  from 11[facility]30[facility]  PHQ-2  Total Score 4 0  PHQ-9 Total Score 9 --        Assessment and Plan:  Meghann Averbeck is a 51 y.o. year old female with a history of undifferentiated psychotic symptoms, mild intellectual disability, history of seizure disorder in childhood, adenocarcinoma of the right breast on tamoxifen s/p radiation, hypertension, who presents for follow up appointment for below.   1. Undifferentiated schizophrenia (HCC) 2. Intellectual developmental disorder, mild There has been improvement in paranoia, hallucinations since restarting Abilify.  She has tolerated it well without any side effect.  Although she used to be on the injection, both her sister and the patient feels comfortable to stay on oral formula.  Discussed option of considering injection in the future if there is any concern of medication adherence.  Noted that although the patient used to be on Benadryl, she has not experienced any EPS.  Will not plan to start this medication. Discussed with caregiver to consider starting process of HCPOA.   3. MDD (major depressive disorder), recurrent episode, mild (HCC) She reports depressive symptoms in the setting of her mother's declining medical condition.  Will start sertraline to target depression.  Discussed potential GI side effect and drowsiness.     Plan Continue Abilify 15 mg at night Start sertraline 25 mg at night  Next appointment: 7/24 at 8:30 for 30 mins, in person   The patient demonstrates the following risk factors for suicide: Chronic risk factors for suicide include: psychiatric disorder of schizophrenia . Acute risk factors for suicide include: N/A. Protective factors for this patient include: positive social support. Considering these factors, the overall suicide risk at this point appears to be low. Patient is appropriate for outpatient follow up.      Collaboration of Care: Collaboration of Care: Other N/A  Patient/Guardian was advised Release of Information must be  obtained prior to any record release in order to collaborate their care with an outside provider. Patient/Guardian was advised if they have not already done so to contact the registration department to sign all necessary forms in order for [place] to release information regarding their care.   Consent: Patient/Guardian gives verbal consent for treatment and assignment of benefits for services provided during this visit. Patient/Guardian expressed understanding and agreed to proceed.    Neysa Hotter, MD 12/06/2021, 9:10 AM

## 2021-12-06 ENCOUNTER — Ambulatory Visit (INDEPENDENT_AMBULATORY_CARE_PROVIDER_SITE_OTHER): Payer: Medicare Other | Admitting: Psychiatry

## 2021-12-06 ENCOUNTER — Encounter: Payer: Self-pay | Admitting: Psychiatry

## 2021-12-06 VITALS — BP 159/99 | HR 114 | Temp 97.9°F | Wt 175.6 lb

## 2021-12-06 DIAGNOSIS — F203 Undifferentiated schizophrenia: Secondary | ICD-10-CM

## 2021-12-06 DIAGNOSIS — F70 Mild intellectual disabilities: Secondary | ICD-10-CM

## 2021-12-06 DIAGNOSIS — F330 Major depressive disorder, recurrent, mild: Secondary | ICD-10-CM | POA: Diagnosis not present

## 2021-12-06 MED ORDER — ARIPIPRAZOLE 15 MG PO TABS: 15.0000 mg | ORAL_TABLET | Freq: Every day | ORAL | 1 refills | Status: DC

## 2021-12-06 MED ORDER — SERTRALINE HCL 25 MG PO TABS
25.0000 mg | ORAL_TABLET | Freq: Every day | ORAL | 1 refills | Status: DC
Start: 2021-12-06 — End: 2022-01-07

## 2022-01-03 NOTE — Progress Notes (Signed)
BH MD/PA/NP OP Progress Note  01/07/2022 9:07 AM Karen Beard  MRN:  [id number]  Chief Complaint:  Chief Complaint  Patient presents with   Follow-up   HPI:  This is a follow-up appointment for schizophrenia and depression.  She states that she has been doing well.  Her mother has been doing better.  She enjoys watching TV, and goes outside every other day.  She has started to interact with her relatives.  She "love" interacting with others.  Although she feels anxious about her mother given she is in her late 33s, she is not worried about any other things.  She denies any panic attacks.  She denies feeling depressed.  She sleeps well.  She has good appetite.  She denies SI. She takes medication with reminder from her mother. She feels safe at the current place.  Karen Beard, her sister presents to the interview.  She thinks Karen Beard has been doing well.  Although there are some occasions of her "staring," she does not think there is any change.  She has it for the past few years.  Karen Beard thinks that Karen Beard is not irritable anymore, although she used to be defensive, and has been getting better.  Both Karen Beard and Karen Beard feel comfortable to stay on the current medication.    Functional Status Instrumental Activities of Daily Living (IADLs):  Karen Beard is independent in the following: cooking, doing household chores, grocery shopping, driving (short distance) Requires assistance with the following: finances   Activities of Daily Living (ADLs):  Karen Beard is independent in the following: bathing and hygiene, feeding, continence, grooming and toileting, walking     05/2019 Head MRI IMPRESSION: Negative MRI head with contrast.   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 Intellectual developmental disorder, mild  F70     3. MDD (major depressive disorder), recurrent, in partial remission ([place])  F33.41       Past Psychiatric History: Please see initial evaluation for full details. I have reviewed the history. No updates at this time.     Past Medical History:  Past Medical History:  Diagnosis Date   Abnormal vaginal bleeding    Breast cancer ([place]) 01/26/2015   Oncoltype score: 3. T1a,No (isolated tumor cells_right breast lumpectomy with rad tx, ER/PR pos, her2 negative, INVASIVE MAMMARY CARCINOMA and DCIS of right breast   Depression    Edema of both legs    Fibroids    GERD (gastroesophageal reflux disease)    High triglycerides    Hypertension    RECENTLY TAKEN OFF OF MEDS PER MOM    Learning disability    Migraine    daily   Personal history of radiation therapy    Seizures ([place])    AS A CHILD-NONE SINCE    Past Surgical History:  Procedure Laterality Date   ABDOMINAL HYSTERECTOMY     AXILLARY HIDRADENITIS EXCISION  [facility]   AXILLARY SENTINEL NODE BIOPSY Right 02/23/2015   Sentinel lymph node biopsy x4, one with isolated tumor cells.;  Dr Jamal Collin, MD;  [facility]: [facility];  ISOLATED TUMOR CELLS IN ONE LYMPH NODE (1/1).   BREAST BIOPSY Right 01/15/2016   stereo Dr Bary Castilla, fat necrosis   BREAST DUCTAL SYSTEM EXCISION Bilateral 01/26/2015   Bilateral retroareolar ductal excision for bloody nipple drainage, DCIS identified on the right   BREAST LUMPECTOMY Right 02/06/2015   Invasive mammary carcinoma.  5 mm, DCIS at margin.  Surgeon: Christene Lye, MD;  Location: [facility] ORS;  Service: General;  Laterality:  Right;   CARPAL TUNNEL RELEASE     CARPAL TUNNEL RELEASE Right 05/08/2016   Procedure: OPEN CARPAL TUNNEL RELEASE;  Surgeon: Corky Mull, MD;  Location: [facility];  Service: Orthopedics;  Laterality: Right;   CYSTOSCOPY N/A 11/24/2017   Procedure: CYSTOSCOPY;  Surgeon: Benjaman Kindler, MD;  Location: [facility] OR[facility];  Service: Gynecology;  Laterality: N/A;   EXCISION / BIOPSY BREAST / NIPPLE / DUCT Bilateral 01/26/2015   left breast duct ecstasis. right breast DCIS   INGUINAL HIDRADENITIS EXCISION     LAPAROSCOPIC BILATERAL SALPINGO OOPHERECTOMY Bilateral 11/24/2017   Procedure: LAPAROSCOPIC BILATERAL SALPINGO OOPHORECTOMY;  Surgeon: Benjaman Kindler, MD;  Location: [facility] OR[facility];  Service: Gynecology;  Laterality: Bilateral;   LAPAROSCOPIC HYSTERECTOMY N/A 11/24/2017   Procedure: HYSTERECTOMY TOTAL LAPAROSCOPIC;  Surgeon: Benjaman Kindler, MD;  Location: [facility] OR[facility];  Service: Gynecology;  Laterality: N/A;    Family Psychiatric History: Please see initial evaluation for full details. I have reviewed the history. No updates at this time.     Family History:  Family History  Problem Relation Age of Onset   Breast cancer Sister 51   Testicular cancer Father     Social History:  Social History   Socioeconomic History   Marital status: Single    Spouse name: Not on file   Number of children: Not on file   Years of education: Not on file   Highest education level: Not on file  Occupational History   Not on file  Tobacco Use   Smoking status: Never   Smokeless tobacco: Never  Vaping Use   Vaping Use: Never used  Substance and Sexual Activity   Alcohol use: No    Alcohol/week: 0.0 standard drinks of alcohol   Drug use: No   Sexual activity: Not on file  Other Topics Concern   Not on file  Social History Narrative   Not on file   Social Determinants of Health   Financial Resource Strain: Not on file  Food Insecurity: Not on file  Transportation Needs: Not on file  Physical  Activity: Not on file  Stress: Not on file  Social Connections: Not on file    Allergies:  Allergies  Allergen Reactions   Sumatriptan Shortness Of Breath   Cephalexin Nausea And Vomiting   Chocolate Swelling   Corn Oil Other (See Comments)    By test. Pt has eaten without issues.   Peanut Oil Swelling and Other (See Comments)    throat   Peanut-Containing Drug Products Swelling   Phenobarbital Other (See Comments)    Hyperactivity, aggitation   Vilazodone Hcl Nausea Only   Duloxetine Hcl Anxiety and Other (See Comments)    agitation   Latex Rash and Other (See Comments)    Bandaids, gloves(when worn)   Sulfa Antibiotics Rash   Tape Rash    Metabolic Disorder Labs: Lab Results  Component  Value Date   HGBA1C 5.1 06/12/2018   MPG 99.67 06/12/2018   MPG 100 03/13/2016   No results found for: "PROLACTIN" Lab Results  Component Value Date   CHOL 175 06/12/2018   TRIG 270 (H) 06/12/2018   HDL 31 (L) 06/12/2018   CHOLHDL 5.6 06/12/2018   VLDL 54 (H) 06/12/2018   LDLCALC 90 06/12/2018   LDLCALC 109 (H) 03/13/2016   Lab Results  Component Value Date   TSH 1.721 06/12/2018   TSH 1.864 03/13/2016    Therapeutic Level Labs: No results found for: "LITHIUM" No results found for: "VALPROATE" No results found for: "CBMZ"  Current Medications: Current Outpatient Medications  Medication Sig Dispense Refill   ABILIFY MAINTENA 400 MG PRSY prefilled syringe SMARTSIG:1 Each IM Once a Month     albuterol (VENTOLIN HFA) 108 (90 Base) MCG/ACT inhaler Inhale 2 puffs into the lungs every 6 (six) hours as needed.     ARIPiprazole (ABILIFY) 15 MG tablet Take 1 tablet (15 mg total) by mouth at bedtime. 30 tablet 1   aspirin-acetaminophen-caffeine (EXCEDRIN MIGRAINE) 026-378-58 MG per tablet Take 1 tablet by mouth every 6 (six) hours as needed for headache.      cetirizine (ZYRTEC) 10 MG tablet Take 10 mg by mouth daily as needed for allergies.      hydrochlorothiazide (MICROZIDE)  12.5 MG capsule TAKE 1 CAPSULE BY MOUTH ONCE DAILY. **TAKE WITH LOSARTAN 50 MG TABLET**     Icosapent Ethyl (VASCEPA) 1 g CAPS Take 2 g by mouth daily with lunch.     losartan (COZAAR) 50 MG tablet Take 1 tablet by mouth daily.     rizatriptan (MAXALT-MLT) 10 MG disintegrating tablet      sertraline (ZOLOFT) 25 MG tablet Take 1 tablet (25 mg total) by mouth at bedtime. 30 tablet 1   ARIPiprazole (ABILIFY) 10 MG tablet Take 0.5 tablets (5 mg total) by mouth daily for 3 days, THEN 1 tablet (10 mg total) daily for 3 days. 5 tablet 0   omeprazole (PRILOSEC) 20 MG capsule Take 1 capsule by mouth daily.     zonisamide (ZONEGRAN) 100 MG capsule Take 3 capsules by mouth daily.     No current facility-administered medications for this visit.     Musculoskeletal: Strength & Muscle Tone: within normal limits Gait & Station: normal Patient leans: N/A  Psychiatric Specialty Exam: Review of Systems  Psychiatric/Behavioral:  Negative for agitation, behavioral problems, confusion, decreased concentration, dysphoric mood, hallucinations, self-injury, sleep disturbance and suicidal ideas. The patient is nervous/anxious. The patient is not hyperactive.   All other systems reviewed and are negative.   Blood pressure (!) 170/108, pulse 81, temperature 98.7 F (37.1 C), temperature source Temporal, weight 172 lb 12.8 oz (78.4 kg), last menstrual period 11/10/2017.Body mass index is 31.61 kg/m.  General Appearance: Fairly Groomed  Eye Contact:  Good  Speech:  Clear and Coherent  Volume:  Normal  Mood:   good  Affect:  Appropriate, Congruent, and more relaxed  Thought Process:  Coherent  Orientation:  Full (Time, Place, and Person)  Thought Content: Logical   Suicidal Thoughts:  No  Homicidal Thoughts:  No  Memory:  Immediate;   Good  Judgement:  Good  Insight:  Present  Psychomotor Activity:  Normal  Concentration:  Concentration: Good and Attention Span: Good  Recall:  Good  Fund of Knowledge:  Good  Language: Good  Akathisia:  No  Handed:  Right  AIMS (if indicated): not done  Assets:  Communication  Skills Desire for Improvement  ADL's:  Intact  Cognition: WNL  Sleep:  Good   Screenings: AUDIT    Flowsheet Row Admission (Discharged) from 06/12/2018 in [facility] Admission (Discharged) from 03/12/2016 in [facility]  Alcohol Use Disorder Identification Test Final Score (AUDIT) 0 0      GAD-7    Flowsheet Row Office Visit from 01/07/2022 in [facility]  Total GAD-7 Score 3      PHQ2-9    [facility] Office Visit from 01/07/2022 in [facility] Office Visit from 12/06/2021 in [facility] Follow Up  from 11/30/2015 in [facility]  PHQ-2 Total Score 2 4 0  PHQ-9 Total Score 2 9 --        Assessment and Plan:  Karen Beard is a 51 y.o. year old female with a history of undifferentiated psychotic symptoms, mild intellectual disability, history of seizure disorder in childhood, adenocarcinoma of the right breast on tamoxifen s/p radiation, hypertension, , who presents for follow up appointment for below.   1. Undifferentiated schizophrenia ([place]) 2. Intellectual developmental disorder, mild There has been overall improvement in irritability, count of sedation and the paranoia since restarting Abilify.  She has been able to take medication regularly with some reminders from her mother.  After discussing the option of transitioning to injection, she is willing to try oral regiment at least till the next visit with oral medication, and considers this option.  Will continue current dose to target schizophrenia.   3. MDD (major depressive disorder), recurrent, in partial remission ([place]) There has been no more improvement in depressive symptoms and anxiety since starting sertraline.  She has not  experienced any side effect.  Will continue current dose to target depression and anxiety.     Plan Continue Abilify 15 mg at night Continue sertraline 25 mg at night  Next appointment: 9/11 at 10:30 for 30 mins, in person   The patient demonstrates the following risk factors for suicide: Chronic risk factors for suicide include: psychiatric disorder of schizophrenia . Acute risk factors for suicide include: N/A. Protective factors for this patient include: positive social support. Considering these factors, the overall suicide risk at this point appears to be low. Patient is appropriate for outpatient follow up.             Collaboration of Care: Collaboration of Care: Other N/A  Patient/Guardian was advised Release of Information must be obtained prior to any record release in order to collaborate their care with an outside provider. Patient/Guardian was advised if they have not already done so to contact the registration department to sign all necessary forms in order for [place] to release information regarding their care.   Consent: Patient/Guardian gives verbal consent for treatment and assignment of benefits for services provided during this visit. Patient/Guardian expressed understanding and agreed to proceed.    Norman Clay, MD 01/07/2022, 9:07 AM

## 2022-01-07 ENCOUNTER — Ambulatory Visit (INDEPENDENT_AMBULATORY_CARE_PROVIDER_SITE_OTHER): Payer: Medicare Other | Admitting: Psychiatry

## 2022-01-07 ENCOUNTER — Encounter: Payer: Self-pay | Admitting: Psychiatry

## 2022-01-07 VITALS — BP 170/108 | HR 81 | Temp 98.7°F | Wt 172.8 lb

## 2022-01-07 DIAGNOSIS — F203 Undifferentiated schizophrenia: Secondary | ICD-10-CM

## 2022-01-07 DIAGNOSIS — F3341 Major depressive disorder, recurrent, in partial remission: Secondary | ICD-10-CM | POA: Diagnosis not present

## 2022-01-07 DIAGNOSIS — F70 Mild intellectual disabilities: Secondary | ICD-10-CM

## 2022-01-07 MED ORDER — SERTRALINE HCL 25 MG PO TABS: 25.0000 mg | ORAL_TABLET | Freq: Every day | ORAL | 0 refills | Status: DC

## 2022-01-07 MED ORDER — ARIPIPRAZOLE 15 MG PO TABS: 15.0000 mg | ORAL_TABLET | Freq: Every day | ORAL | 0 refills | Status: DC

## 2022-01-07 NOTE — Patient Instructions (Signed)
Continue Abilify 15 mg at night Continue sertraline 25 mg at night  Next appointment: 9/11 at 10:30

## 2022-02-22 NOTE — Progress Notes (Unsigned)
Virtual Visit via Telephone Note  I connected with Karen Beard on 02/25/22 at 10:30 AM EDT by telephone and verified that I am speaking with the correct person using two identifiers.  Location: Patient: home Provider: office Persons participated in the visit- patient, provider    I discussed the limitations, risks, security and privacy concerns of performing an evaluation and management service by telephone and the availability of in person appointments. I also discussed with the patient that there may be a patient responsible charge related to this service. The patient expressed understanding and agreed to proceed.    I discussed the assessment and treatment plan with the patient. The patient was provided an opportunity to ask questions and all were answered. The patient agreed with the plan and demonstrated an understanding of the instructions.   The patient was advised to call back or seek an in-person evaluation if the symptoms worsen or if the condition fails to improve as anticipated.  I provided 17 minutes of non-face-to-face time during this encounter.   Karen Hotter, MD    [facility] MD/PA/NP OP Progress Note  02/25/2022 11:00 AM Karen Beard  MRN:  [id number]  Chief Complaint:  Chief Complaint  Patient presents with   Follow-up   Other   HPI:  This is a follow-up appointment for schizophrenia and depression.  She states that she has been doing well.  She is doing things in the house, and going shopping with her mother.  She does not have concern about the neighbor anymore.  When she is asked about having any anxiety, she initially states no, and asks her mother whether she thinks Karen Beard has been anxious.  She has been able to take care of herself without issues.  She had a good time when she went to church the other time.  She sleeps well.  She denies change in appetite.  She denies SI, HI, AH, VH.  She has been able to take medication consistently.   Her  mother presents to the interview.  She thinks Karen Beard has been back to who she was.  She states that Karen Beard occasionally stares outside, and stops what she is doing.  Her mother does not think there has been significant change compared to before.  Her mother denies any concern otherwise.  Karen Beard has been able to take care of her mother, and she is a good observant to see what is needed.   Household: mother (she used to live by herself and moved into her mother's around the time her father was sick in 2015.) Marital status:  single Number of children: 0  Employment: nursing aid, house keeping, worked until [employer] Education:  high school/special school, Learning disability  Last PCP / ongoing medical evaluation:      Visit Diagnosis:    ICD-10-CM   1. 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 Intellectual developmental disorder, mild  F70     3. MDD (major depressive disorder), recurrent, in partial remission (HCC)  F33.41       Past Psychiatric History: Please see initial evaluation for full details. I have reviewed the history. No updates at this time.     Past Medical History:  Past Medical History:  Diagnosis Date   Abnormal vaginal bleeding    Breast cancer (HCC) 01/26/2015   Oncoltype score: 3. T1a,No (isolated tumor cells_right breast lumpectomy with rad tx, ER/PR pos, her2 negative, INVASIVE MAMMARY CARCINOMA and DCIS of right breast   Depression  Edema of both legs    Fibroids    GERD (gastroesophageal reflux disease)    High triglycerides    Hypertension    RECENTLY TAKEN OFF OF MEDS PER MOM    Learning disability    Migraine    daily   Personal history of radiation therapy    Seizures (HCC)    AS A CHILD-NONE SINCE    Past Surgical History:  Procedure Laterality Date   ABDOMINAL HYSTERECTOMY     AXILLARY HIDRADENITIS EXCISION     [facility]   AXILLARY SENTINEL NODE BIOPSY Right 02/23/2015   Sentinel lymph node biopsy x4, one with isolated tumor cells.;  Dr  Evette Cristal, MD;  [facility];  ISOLATED TUMOR CELLS IN ONE LYMPH NODE (1/1).   BREAST BIOPSY Right 01/15/2016   stereo Dr Lemar Livings, fat necrosis   BREAST DUCTAL SYSTEM EXCISION Bilateral 01/26/2015   Bilateral retroareolar ductal excision for bloody nipple drainage, DCIS identified on the right   BREAST LUMPECTOMY Right 02/06/2015   Invasive mammary carcinoma.  5 mm, DCIS at margin.  Surgeon: Kieth Brightly, MD;  Location: [facility] ORS;  Service: General;  Laterality: Right;   CARPAL TUNNEL RELEASE     CARPAL TUNNEL RELEASE Right 05/08/2016   Procedure: OPEN CARPAL TUNNEL RELEASE;  Surgeon: Christena Flake, MD;  Location: [facility];  Service: Orthopedics;  Laterality: Right;   CYSTOSCOPY N/A 11/24/2017   Procedure: CYSTOSCOPY;  Surgeon: Christeen Douglas, MD;  Location: [facility];  Service: Gynecology;  Laterality: N/A;   EXCISION / BIOPSY BREAST / NIPPLE / DUCT Bilateral 01/26/2015   left breast duct ecstasis. right breast DCIS   INGUINAL HIDRADENITIS EXCISION     LAPAROSCOPIC BILATERAL SALPINGO OOPHERECTOMY Bilateral 11/24/2017   Procedure: LAPAROSCOPIC BILATERAL SALPINGO OOPHORECTOMY;  Surgeon: Christeen Douglas, MD;  Location: [facility];  Service: Gynecology;  Laterality: Bilateral;   LAPAROSCOPIC HYSTERECTOMY N/A 11/24/2017   Procedure: HYSTERECTOMY TOTAL LAPAROSCOPIC;  Surgeon: Christeen Douglas, MD;  Location: [facility];  Service: Gynecology;  Laterality: N/A;    Family Psychiatric History: Please see initial evaluation for full details. I have reviewed the history. No updates at this time.     Family History:  Family History  Problem Relation Age of Onset   Breast cancer Sister 4   Testicular cancer Father     Social History:  Social History   Socioeconomic History   Marital status: Single    Spouse name: Not on file   Number of children: Not on file   Years of education: Not on file   Highest education level: Not on file  Occupational History   Not on file  Tobacco Use    Smoking status: Never   Smokeless tobacco: Never  Vaping Use   Vaping Use: Never used  Substance and Sexual Activity   Alcohol use: No    Alcohol/week: 0.0 standard drinks of alcohol   Drug use: No   Sexual activity: Not on file  Other Topics Concern   Not on file  Social History Narrative   Not on file   Social Determinants of Health   Financial Resource Strain: Not on file  Food Insecurity: Not on file  Transportation Needs: Not on file  Physical Activity: Not on file  Stress: Not on file  Social Connections: Not on file    Allergies:  Allergies  Allergen Reactions   Sumatriptan Shortness Of Breath   Cephalexin Nausea And Vomiting   Chocolate Swelling   Corn Oil Other (See Comments)  By test. Pt has eaten without issues.   Peanut Oil Swelling and Other (See Comments)    throat   Peanut-Containing Drug Products Swelling   Phenobarbital Other (See Comments)    Hyperactivity, aggitation   Vilazodone Hcl Nausea Only   Duloxetine Hcl Anxiety and Other (See Comments)    agitation   Latex Rash and Other (See Comments)    Bandaids, gloves(when worn)   Sulfa Antibiotics Rash   Tape Rash    Metabolic Disorder Labs: Lab Results  Component Value Date   HGBA1C 5.1 06/12/2018   MPG 99.67 06/12/2018   MPG 100 03/13/2016   No results found for: "PROLACTIN" Lab Results  Component Value Date   CHOL 175 06/12/2018   TRIG 270 (H) 06/12/2018   HDL 31 (L) 06/12/2018   CHOLHDL 5.6 06/12/2018   VLDL 54 (H) 06/12/2018   LDLCALC 90 06/12/2018   LDLCALC 109 (H) 03/13/2016   Lab Results  Component Value Date   TSH 1.721 06/12/2018   TSH 1.864 03/13/2016    Therapeutic Level Labs: No results found for: "LITHIUM" No results found for: "VALPROATE" No results found for: "CBMZ"  Current Medications: Current Outpatient Medications  Medication Sig Dispense Refill   albuterol (VENTOLIN HFA) 108 (90 Base) MCG/ACT inhaler Inhale 2 puffs into the lungs every 6 (six) hours  as needed.     [START ON 03/08/2022] ARIPiprazole (ABILIFY) 15 MG tablet Take 1 tablet (15 mg total) by mouth at bedtime. 30 tablet 1   aspirin-acetaminophen-caffeine (EXCEDRIN MIGRAINE) 161-096-04 MG per tablet Take 1 tablet by mouth every 6 (six) hours as needed for headache.      cetirizine (ZYRTEC) 10 MG tablet Take 10 mg by mouth daily as needed for allergies.      hydrochlorothiazide (MICROZIDE) 12.5 MG capsule TAKE 1 CAPSULE BY MOUTH ONCE DAILY. **TAKE WITH LOSARTAN 50 MG TABLET**     Icosapent Ethyl (VASCEPA) 1 g CAPS Take 2 g by mouth daily with lunch.     losartan (COZAAR) 50 MG tablet Take 1 tablet by mouth daily.     omeprazole (PRILOSEC) 20 MG capsule Take 1 capsule by mouth daily.     rizatriptan (MAXALT-MLT) 10 MG disintegrating tablet      [START ON 03/07/2022] sertraline (ZOLOFT) 25 MG tablet Take 1 tablet (25 mg total) by mouth at bedtime. 30 tablet 1   zonisamide (ZONEGRAN) 100 MG capsule Take 3 capsules by mouth daily.     No current facility-administered medications for this visit.     Musculoskeletal: Strength & Muscle Tone:  N/A Gait & Station:  N/A Patient leans: N/A  Psychiatric Specialty Exam: Review of Systems  Last menstrual period 11/10/2017.There is no height or weight on file to calculate BMI.  General Appearance: NA  Eye Contact:  NA  Speech:  Clear and Coherent  Volume:  Normal  Mood:   good  Affect:  NA  Thought Process:  Coherent  Orientation:  Full (Time, Place, and Person)  Thought Content: Logical   Suicidal Thoughts:  No  Homicidal Thoughts:  No  Memory:  Immediate;   Good  Judgement:  Good  Insight:  Fair  Psychomotor Activity:  Normal  Concentration:  Concentration: Good and Attention Span: Good  Recall:  Good  Fund of Knowledge: Good  Language: Good  Akathisia:  No  Handed:  Right  AIMS (if indicated): not done  Assets:  Communication Skills Desire for Improvement  ADL's:  Intact  Cognition: WNL  Sleep:  Good  Screenings: AUDIT    Flowsheet Row Admission (Discharged) from 06/12/2018 in [place] Admission (Discharged) from 03/12/2016 in [place]  Alcohol Use Disorder Identification Test Final Score (AUDIT) 0 0      GAD-7    Flowsheet Row Office Visit from 01/07/2022 in [place]  Total GAD-7 Score 3      PHQ2-9    [place] Office Visit from 01/07/2022 in [place] Office Visit from 12/06/2021 in [place] Follow Up  from 11/30/2015 in [place]  PHQ-2 Total Score 2 4 0  PHQ-9 Total Score 2 9 --        Assessment and Plan:  Jalayla Chrismer is a 51 y.o. 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   1. Undifferentiated schizophrenia ([place]) 2. Intellectual developmental disorder, mild There has been overall improvement in irritability and paranoia since restarting Abilify.  Although there is some concern of thought blocking, the mother feels comfortable to stay at the current dose of Abilify at this time.  Will continue current dose of Abilify to target schizophrenia.  Noted that although she used to be on injection in the past, she has been able to take medication consistently, and reports preference to stay on oral at this time.   3. MDD (major depressive disorder), recurrent, in partial remission ([place]) Overall improving since starting sertraline.  Will continue current dose to target depression and anxiety.       Plan Continue Abilify 15 mg at night Continue sertraline 25 mg at night  Next appointment: 11/13 at 10:30 for 30 mins, in person   The patient demonstrates  the following risk factors for suicide: Chronic risk factors for suicide include: psychiatric disorder of schizophrenia . Acute risk factors for suicide include: N/A. Protective factors for this patient include: positive social support. Considering these factors, the overall suicide risk at this point appears to be low. Patient is appropriate for outpatient follow up.             Collaboration of Care: Collaboration of Care: Other N/A  Patient/Guardian was advised Release of Information must be obtained prior to any record release in order to collaborate their care with an outside provider. Patient/Guardian was advised if they have not already done so to contact the registration department to sign all necessary forms in order for [place] to release information regarding their care.   Consent: Patient/Guardian gives verbal consent for treatment and assignment of benefits for services provided during this visit. Patient/Guardian expressed understanding and agreed to proceed.    Norman Clay, MD 02/25/2022, 11:00 AM

## 2022-02-25 ENCOUNTER — Encounter: Payer: Self-pay | Admitting: Psychiatry

## 2022-02-25 ENCOUNTER — Telehealth (INDEPENDENT_AMBULATORY_CARE_PROVIDER_SITE_OTHER): Payer: Medicare Other | Admitting: Psychiatry

## 2022-02-25 DIAGNOSIS — F70 Mild intellectual disabilities: Secondary | ICD-10-CM

## 2022-02-25 DIAGNOSIS — F3341 Major depressive disorder, recurrent, in partial remission: Secondary | ICD-10-CM | POA: Diagnosis not present

## 2022-02-25 DIAGNOSIS — F203 Undifferentiated schizophrenia: Secondary | ICD-10-CM

## 2022-02-25 MED ORDER — SERTRALINE HCL 25 MG PO TABS: 25.0000 mg | ORAL_TABLET | Freq: Every day | ORAL | 1 refills | Status: DC

## 2022-02-25 MED ORDER — ARIPIPRAZOLE 15 MG PO TABS: 15.0000 mg | ORAL_TABLET | Freq: Every day | ORAL | 1 refills | Status: DC

## 2022-03-11 ENCOUNTER — Telehealth: Payer: Self-pay | Admitting: Psychiatry

## 2022-03-11 ENCOUNTER — Other Ambulatory Visit: Payer: Self-pay | Admitting: Psychiatry

## 2022-03-11 NOTE — Telephone Encounter (Signed)
The order was sent a few weeks ago. I contacted the pharmacy. The medication is there to pick up. Please advise the patient to pick it up.

## 2022-03-11 NOTE — Telephone Encounter (Signed)
Patient sister, Sharyn Lull, stopped by office. Stating her refill for Abilify needs to be sent to pharmacy. She ran out last week. Please advise.

## 2022-04-24 NOTE — Progress Notes (Signed)
BH MD/PA/NP OP Progress Note  04/29/2022 11:10 AM Karen Beard  MRN:  [id number]  Chief Complaint:  Chief Complaint  Patient presents with   Follow-up   HPI:  This is a follow-up appointment for schizophrenia and depression.  She states that she is concerned about her mother.  She lost her uncle, and her other friends.  She is worried about her mother's health.  She has been doing good otherwise.  She tends to stay in the house most of the time.  The patient has mood symptoms as in PHQ-9/GAD-7.  She denies SI.  She denies hallucinations, paranoia.  She is wanting to work on diet after discussion.   Karen Beard, her sister presents to the visit.  They lost their uncle a few weeks ago. He was battling with cancer, and they were close to him.  Karen Beard is trying to let her have time by herself, referring to their mother, who is in 75's.  Although Karen Beard does not mind taking care of her mother so that Karen Beard can go anyplace, she tends to stay at home.  Karen Beard does enjoy coming to the church.  Karen Beard thinks Abilify has been the most helpful medication for paranoia.  Karen Beard has been doing well in that aspect.  Her mother takes care of her medication, and Karen Beard has been taking it consistently.  Karen Beard was able to lose weight when she was working on cutting sweets.  Karen Beard does not think Abilify is causing weight gain.    Wt Readings from Last 3 Encounters:  04/29/22 198 lb 6.4 oz (90 kg)  01/07/22 172 lb 12.8 oz (78.4 kg)  12/06/21 175 lb 9.6 oz (79.7 kg)     Visit Diagnosis:    ICD-10-CM   1. 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 Intellectual developmental disorder, mild  F70     3. 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 I have reviewed the history. No updates at this time.     Past Medical History:  Past Medical History:  Diagnosis Date    Abnormal vaginal bleeding    Breast cancer ([place]) 01/26/2015   Oncoltype score: 3. T1a,No (isolated tumor cells_right breast lumpectomy with rad tx, ER/PR pos, her2 negative, INVASIVE MAMMARY CARCINOMA and DCIS of right breast   Depression    Edema of both legs    Fibroids    GERD (gastroesophageal reflux disease)    High triglycerides    Hypertension    RECENTLY TAKEN OFF OF MEDS PER MOM    Learning disability    Migraine    daily   Personal history of radiation therapy    Seizures ([facility])    AS A CHILD-NONE SINCE    Past Surgical History:  Procedure Laterality Date   ABDOMINAL HYSTERECTOMY     AXILLARY HIDRADENITIS EXCISION     [facility]   AXILLARY SENTINEL NODE BIOPSY Right 02/23/2015   Sentinel lymph node biopsy x4, one with isolated tumor cells.;  Dr Jamal Collin, MD;  [facility]S;  ISOLATED TUMOR CELLS IN ONE LYMPH NODE (1/1).   BREAST BIOPSY Right 01/15/2016   stereo Dr Bary Castilla, fat necrosis   BREAST DUCTAL SYSTEM EXCISION Bilateral 01/26/2015   Bilateral retroareolar ductal excision for bloody nipple drainage, DCIS identified on the right   BREAST LUMPECTOMY Right 02/06/2015   Invasive mammary carcinoma.  5 mm, DCIS at margin.  Surgeon:  Seeplaputhur Robinette Haines, MD;  Location: [facility] ORS;  Service: General;  Laterality: Right;   CARPAL TUNNEL RELEASE     CARPAL TUNNEL RELEASE Right 05/08/2016   Procedure: OPEN CARPAL TUNNEL RELEASE;  Surgeon: Corky Mull, MD;  Location: [place];  Service: Orthopedics;  Laterality: Right;   CYSTOSCOPY N/A 11/24/2017   Procedure: CYSTOSCOPY;  Surgeon: Benjaman Kindler, MD;  Location: [facility] ORS;  Service: Gynecology;  Laterality: N/A;   EXCISION / BIOPSY BREAST / NIPPLE / DUCT Bilateral 01/26/2015   left breast duct ecstasis. right breast DCIS   INGUINAL HIDRADENITIS EXCISION     LAPAROSCOPIC BILATERAL SALPINGO OOPHERECTOMY Bilateral 11/24/2017   Procedure: LAPAROSCOPIC BILATERAL SALPINGO OOPHORECTOMY;  Surgeon: Benjaman Kindler, MD;  Location: [facility] ORS;   Service: Gynecology;  Laterality: Bilateral;   LAPAROSCOPIC HYSTERECTOMY N/A 11/24/2017   Procedure: HYSTERECTOMY TOTAL LAPAROSCOPIC;  Surgeon: Benjaman Kindler, MD;  Location: [facility] ORS;  Service: Gynecology;  Laterality: N/A;    Family Psychiatric History: Please see initial evaluation for full details. I have reviewed the history. No updates at this time.     Family History:  Family History  Problem Relation Age of Onset   Breast cancer Sister 63   Testicular cancer Father     Social History:  Social History   Socioeconomic History   Marital status: Single    Spouse name: Not on file   Number of children: Not on file   Years of education: Not on file   Highest education level: Not on file  Occupational History   Not on file  Tobacco Use   Smoking status: Never   Smokeless tobacco: Never  Vaping Use   Vaping Use: Never used  Substance and Sexual Activity   Alcohol use: No    Alcohol/week: 0.0 standard drinks of alcohol   Drug use: No   Sexual activity: Not on file  Other Topics Concern   Not on file  Social History Narrative   Not on file   Social Determinants of Health   Financial Resource Strain: Not on file  Food Insecurity: Not on file  Transportation Needs: Not on file  Physical Activity: Not on file  Stress: Not on file  Social Connections: Not on file    Allergies:  Allergies  Allergen Reactions   Sumatriptan Shortness Of Breath   Cephalexin Nausea And Vomiting   Chocolate Swelling   Corn Oil Other (See Comments)    By test. Pt has eaten without issues.   Peanut Oil Swelling and Other (See Comments)    throat   Peanut-Containing Drug Products Swelling   Phenobarbital Other (See Comments)    Hyperactivity, aggitation   Vilazodone Hcl Nausea Only   Duloxetine Hcl Anxiety and Other (See Comments)    agitation   Latex Rash and Other (See Comments)    Bandaids, gloves(when worn)   Sulfa Antibiotics Rash   Tape Rash    Metabolic Disorder  Labs: Lab Results  Component Value Date   HGBA1C 5.1 06/12/2018   MPG 99.67 06/12/2018   MPG 100 03/13/2016   No results found for: "PROLACTIN" Lab Results  Component Value Date   CHOL 175 06/12/2018   TRIG 270 (H) 06/12/2018   HDL 31 (L) 06/12/2018   CHOLHDL 5.6 06/12/2018   VLDL 54 (H) 06/12/2018   LDLCALC 90 06/12/2018   LDLCALC 109 (H) 03/13/2016   Lab Results  Component Value Date   TSH 1.721 06/12/2018   TSH 1.864 03/13/2016    Therapeutic Level  Labs: No results found for: "LITHIUM" No results found for: "VALPROATE" No results found for: "CBMZ"  Current Medications: Current Outpatient Medications  Medication Sig Dispense Refill   albuterol (VENTOLIN HFA) 108 (90 Base) MCG/ACT inhaler Inhale 2 puffs into the lungs every 6 (six) hours as needed.     ARIPiprazole (ABILIFY) 15 MG tablet Take 1 tablet (15 mg total) by mouth at bedtime. 30 tablet 1   aspirin-acetaminophen-caffeine (EXCEDRIN MIGRAINE) 712-458-09 MG per tablet Take 1 tablet by mouth every 6 (six) hours as needed for headache.      cetirizine (ZYRTEC) 10 MG tablet Take 10 mg by mouth daily as needed for allergies.      hydrochlorothiazide (MICROZIDE) 12.5 MG capsule TAKE 1 CAPSULE BY MOUTH ONCE DAILY. **TAKE WITH LOSARTAN 50 MG TABLET**     Icosapent Ethyl (VASCEPA) 1 g CAPS Take 2 g by mouth daily with lunch.     losartan (COZAAR) 50 MG tablet Take 1 tablet by mouth daily.     rizatriptan (MAXALT-MLT) 10 MG disintegrating tablet      sertraline (ZOLOFT) 25 MG tablet Take 1 tablet (25 mg total) by mouth at bedtime. 30 tablet 1   omeprazole (PRILOSEC) 20 MG capsule Take 1 capsule by mouth daily.     zonisamide (ZONEGRAN) 100 MG capsule Take 3 capsules by mouth daily.     No current facility-administered medications for this visit.     Musculoskeletal: Strength & Muscle Tone: within normal limits Gait & Station: normal Patient leans: N/A  Psychiatric Specialty Exam: Review of Systems   Psychiatric/Behavioral:  Positive for dysphoric mood. Negative for agitation, behavioral problems, confusion, decreased concentration, hallucinations, self-injury, sleep disturbance and suicidal ideas. The patient is nervous/anxious. The patient is not hyperactive.   All other systems reviewed and are negative.   Blood pressure (!) 139/91, pulse 74, temperature 98.3 F (36.8 C), temperature source Temporal, height 5' 2.6" (1.59 m), weight 198 lb 6.4 oz (90 kg), last menstrual period 11/10/2017.Body mass index is 35.6 kg/m.  General Appearance: Fairly Groomed  Eye Contact:  Good  Speech:  Clear and Coherent  Volume:  Normal  Mood:  Depressed  Affect:  Appropriate, Congruent, and down  Thought Process:  Coherent, concrete  Orientation:  Full (Time, Place, and Person)  Thought Content: Logical   Suicidal Thoughts:  No  Homicidal Thoughts:  No  Memory:  Immediate;   Good  Judgement:  Good  Insight:  Present  Psychomotor Activity:  Normal, no rigidity, no resting tremors  Concentration:  Concentration: Good and Attention Span: Good  Recall:  Good  Fund of Knowledge: Good  Language: Good  Akathisia:  No  Handed:  Right  AIMS (if indicated): not done  Assets:  Communication Skills Desire for Improvement  ADL's:  Intact  Cognition: WNL  Sleep:  Good   Screenings: AUDIT    Flowsheet Row Admission (Discharged) from 06/12/2018 in [place] Admission (Discharged) from 03/12/2016 in [place]  Alcohol Use Disorder Identification Test Final Score (AUDIT) 0 0      GAD-7    Flowsheet Row Office Visit from 04/29/2022 in [place] Office Visit from 01/07/2022 in [place]  Total GAD-7 Score 9 3      PHQ2-9    [place] Office Visit from 04/29/2022 in [place] Office Visit from 01/07/2022 in [place] Office  Visit from 12/06/2021 in [place] Follow Up  from  11/30/2015 in [place]  PHQ-2 Total Score _0 0  PHQ-9 Total Score _1 --        Assessment and Plan:  Ingra Rother is a 51 y.o. year old female with a history of undifferentiated psychotic symptoms, mild intellectual disability, history of seizure disorder in childhood, adenocarcinoma of the right breast on tamoxifen s/p radiation, hypertension, who presents for follow up appointment for below.   1. Undifferentiated schizophrenia ([place]) 2. Intellectual developmental disorder, mild There has been overall improvement in irritability and paranoia since restarting Abilify.  Will continue current dose of Abilify to target schizophrenia.  Noted that she has had weight gain since the last visit, which her sister-in-law attributes to a healthy diet and lack of exercise rather than the medication as she has been on Abilify for many years in the past.  Will add metformin for weight gain associated with antipsychotic use.  Discussed potential risk of GI symptoms.  She is willing to work on diet and physical activity.  Will consider obtaining EKG at the next visit as a new baseline, although she is not at high risk of QTc prolongation due to lack of other risk factors except her gender.   3. MDD (major depressive disorder), recurrent episode, mild ([facility]) Slightly worsening in the context of losses, including her uncle.  She feels comfortable to stay at the current dose of sertraline.  Will continue the current dose to target depression and anxiety.      Plan Continue Abilify 15 mg at night Continue sertraline 25 mg at night  Start metformin 250 mg at night Next appointment: 1/9 at 2:30 for 30 mins, in person   The patient demonstrates the following risk factors for suicide: Chronic risk factors for suicide include: psychiatric disorder of schizophrenia . Acute risk  factors for suicide include: N/A. Protective factors for this patient include: positive social support. Considering these factors, the overall suicide risk at this point appears to be low. Patient is appropriate for outpatient follow up.     This clinician has discussed the side effect associated with medication prescribed during this encounter. Please refer to notes in the previous encounters for more details.      Collaboration of Care: Collaboration of Care: Other reviewed notes in Epic  Patient/Guardian was advised Release of Information must be obtained prior to any record release in order to collaborate their care with an outside provider. Patient/Guardian was advised if they have not already done so to contact the registration department to sign all necessary forms in order for [place] to release information regarding their care.   Consent: Patient/Guardian gives verbal consent for treatment and assignment of benefits for services provided during this visit. Patient/Guardian expressed understanding and agreed to proceed.    Norman Clay, MD 04/29/2022, 11:10 AM

## 2022-04-29 ENCOUNTER — Ambulatory Visit (INDEPENDENT_AMBULATORY_CARE_PROVIDER_SITE_OTHER): Payer: Medicare Other | Admitting: Psychiatry

## 2022-04-29 ENCOUNTER — Encounter: Payer: Self-pay | Admitting: Psychiatry

## 2022-04-29 VITALS — BP 139/91 | HR 74 | Temp 98.3°F | Ht 62.6 in | Wt 198.4 lb

## 2022-04-29 DIAGNOSIS — F70 Mild intellectual disabilities: Secondary | ICD-10-CM | POA: Diagnosis not present

## 2022-04-29 DIAGNOSIS — F203 Undifferentiated schizophrenia: Secondary | ICD-10-CM

## 2022-04-29 DIAGNOSIS — F330 Major depressive disorder, recurrent, mild: Secondary | ICD-10-CM | POA: Diagnosis not present

## 2022-04-29 MED ORDER — METFORMIN HCL 500 MG PO TABS: 250.0000 mg | ORAL_TABLET | Freq: Every evening | ORAL | 1 refills | Status: DC

## 2022-04-29 MED ORDER — ARIPIPRAZOLE 15 MG PO TABS: 15.0000 mg | ORAL_TABLET | Freq: Every day | ORAL | 1 refills | Status: DC

## 2022-04-29 MED ORDER — SERTRALINE HCL 25 MG PO TABS: 25.0000 mg | ORAL_TABLET | Freq: Every day | ORAL | 1 refills | Status: DC

## 2022-04-29 NOTE — Patient Instructions (Signed)
Continue Abilify 15 mg at night Continue sertraline 25 mg at night  Start metformin 250 mg at night Next appointment: 1/9 at 2:30

## 2022-06-21 ENCOUNTER — Other Ambulatory Visit: Payer: Self-pay | Admitting: General Surgery

## 2022-06-21 DIAGNOSIS — Z1231 Encounter for screening mammogram for malignant neoplasm of breast: Secondary | ICD-10-CM

## 2022-06-23 NOTE — Progress Notes (Unsigned)
BH MD/PA/NP OP Progress Note  06/25/2022 3:02 PM Karen Beard  MRN:  [id number]  Chief Complaint:  Chief Complaint  Patient presents with   Follow-up   HPI:  This is a follow-up appointment for schizophrenia and depression.  She states that she has been doing okay.  She states that her mother did not give her Christmas present, when she was asked about Christmas.  She feels fine about this.  She states that her mother is not doing well.  She passes out in the couch at times.  However, she feels anxious at times, and denies it as being consuming to her.  Although she has not worked on exercise, she agrees to try on warmer days. The patient has mood symptoms as in PHQ-9/GAD-7.  She states that she does not feel sad or depressed. She may have a little more appetite lately. She denies SI.  She denies hallucinations , paranoia. she takes medication regularly.  She denies any side effect from metformin.   Sharyn Lull, her sister presents to the visit.  Sharyn Lull states that their mother has been doing fine.  She is 52 year old.  Although she may doze off at times, there has been no change. She denies any concern at this time.   Wt Readings from Last 3 Encounters:  06/25/22 198 lb 3.2 oz (89.9 kg)  04/29/22 198 lb 6.4 oz (90 kg)  01/07/22 172 lb 12.8 oz (78.4 kg)    Visit Diagnosis:    ICD-10-CM   1. 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 Intellectual developmental disorder, mild  F70     3. Recurrent major depressive disorder, in partial remission ([place])  F33.41       Past Psychiatric History: Please see initial evaluation for full details. I have reviewed the history. No updates at this time.     Past Medical History:  Past Medical History:  Diagnosis Date   Abnormal vaginal bleeding    Breast cancer ([place]) 01/26/2015   Oncoltype score: 3. T1a,No (isolated tumor cells_right breast lumpectomy with rad tx, ER/PR pos, her2 negative, INVASIVE MAMMARY CARCINOMA  and DCIS of right breast   Depression    Edema of both legs    Fibroids    GERD (gastroesophageal reflux disease)    High triglycerides    Hypertension    RECENTLY TAKEN OFF OF MEDS PER MOM    Learning disability    Migraine    daily   Personal history of radiation therapy    Seizures ([place])    AS A CHILD-NONE SINCE    Past Surgical History:  Procedure Laterality Date   ABDOMINAL HYSTERECTOMY     AXILLARY HIDRADENITIS EXCISION     unc   AXILLARY SENTINEL NODE BIOPSY Right 02/23/2015   Sentinel lymph node biopsy x4, one with isolated tumor cells.;  Dr Jamal Collin, MD;  [facility] ORS;  ISOLATED TUMOR CELLS IN ONE LYMPH NODE (1/1).   BREAST BIOPSY Right 01/15/2016   stereo Dr Bary Castilla, fat necrosis   BREAST DUCTAL SYSTEM EXCISION Bilateral 01/26/2015   Bilateral retroareolar ductal excision for bloody nipple drainage, DCIS identified on the right   BREAST LUMPECTOMY Right 02/06/2015   Invasive mammary carcinoma.  5 mm, DCIS at margin.  Surgeon: Christene Lye, MD;  Location: [facility] ORS;  Service: General;  Laterality: Right;   CARPAL TUNNEL RELEASE     CARPAL TUNNEL RELEASE Right 05/08/2016   Procedure: OPEN CARPAL TUNNEL RELEASE;  Surgeon: Corky Mull, MD;  Location: [place];  Service: Orthopedics;  Laterality: Right;   CYSTOSCOPY N/A 11/24/2017   Procedure: CYSTOSCOPY;  Surgeon: Benjaman Kindler, MD;  Location: [facility] ORS;  Service: Gynecology;  Laterality: N/A;   EXCISION / BIOPSY BREAST / NIPPLE / DUCT Bilateral 01/26/2015   left breast duct ecstasis. right breast DCIS   INGUINAL HIDRADENITIS EXCISION     LAPAROSCOPIC BILATERAL SALPINGO OOPHERECTOMY Bilateral 11/24/2017   Procedure: LAPAROSCOPIC BILATERAL SALPINGO OOPHORECTOMY;  Surgeon: Benjaman Kindler, MD;  Location: [facility] ORS;  Service: Gynecology;  Laterality: Bilateral;   LAPAROSCOPIC HYSTERECTOMY N/A 11/24/2017   Procedure: HYSTERECTOMY TOTAL LAPAROSCOPIC;  Surgeon: Benjaman Kindler, MD;  Location: [facility] ORS;  Service:  Gynecology;  Laterality: N/A;    Family Psychiatric History: Please see initial evaluation for full details. I have reviewed the history. No updates at this time.     Family History:  Family History  Problem Relation Age of Onset   Breast cancer Sister 39   Testicular cancer Father     Social History:  Social History   Socioeconomic History   Marital status: Single    Spouse name: Not on file   Number of children: Not on file   Years of education: Not on file   Highest education level: Not on file  Occupational History   Not on file  Tobacco Use   Smoking status: Never   Smokeless tobacco: Never  Vaping Use   Vaping Use: Never used  Substance and Sexual Activity   Alcohol use: No    Alcohol/week: 0.0 standard drinks of alcohol   Drug use: No   Sexual activity: Not on file  Other Topics Concern   Not on file  Social History Narrative   Not on file   Social Determinants of Health   Financial Resource Strain: Not on file  Food Insecurity: Not on file  Transportation Needs: Not on file  Physical Activity: Not on file  Stress: Not on file  Social Connections: Not on file    Allergies:  Allergies  Allergen Reactions   Sumatriptan Shortness Of Breath   Cephalexin Nausea And Vomiting   Chocolate Swelling   Corn Oil Other (See Comments)    By test. Pt has eaten without issues.   Peanut Oil Swelling and Other (See Comments)    throat   Peanut-Containing Drug Products Swelling   Phenobarbital Other (See Comments)    Hyperactivity, aggitation   Vilazodone Hcl Nausea Only   Duloxetine Hcl Anxiety and Other (See Comments)    agitation   Latex Rash and Other (See Comments)    Bandaids, gloves(when worn)   Sulfa Antibiotics Rash   Tape Rash    Metabolic Disorder Labs: Lab Results  Component Value Date   HGBA1C 5.1 06/12/2018   MPG 99.67 06/12/2018   MPG 100 03/13/2016   No results found for: "PROLACTIN" Lab Results  Component Value Date   CHOL 175  06/12/2018   TRIG 270 (H) 06/12/2018   HDL 31 (L) 06/12/2018   CHOLHDL 5.6 06/12/2018   VLDL 54 (H) 06/12/2018   LDLCALC 90 06/12/2018   LDLCALC 109 (H) 03/13/2016   Lab Results  Component Value Date   TSH 1.721 06/12/2018   TSH 1.864 03/13/2016    Therapeutic Level Labs: No results found for: "LITHIUM" No results found for: "VALPROATE" No results found for: "CBMZ"  Current Medications: Current Outpatient Medications  Medication Sig Dispense Refill   albuterol (VENTOLIN HFA) 108 (90 Base) MCG/ACT inhaler Inhale 2 puffs into the  lungs every 6 (six) hours as needed.     aspirin-acetaminophen-caffeine (EXCEDRIN MIGRAINE) 250-250-65 MG per tablet Take 1 tablet by mouth every 6 (six) hours as needed for headache.      cetirizine (ZYRTEC) 10 MG tablet Take 10 mg by mouth daily as needed for allergies.      hydrochlorothiazide (MICROZIDE) 12.5 MG capsule TAKE 1 CAPSULE BY MOUTH ONCE DAILY. **TAKE WITH LOSARTAN 50 MG TABLET**     Icosapent Ethyl (VASCEPA) 1 g CAPS Take 2 g by mouth daily with lunch.     losartan (COZAAR) 50 MG tablet Take 1 tablet by mouth daily.     rizatriptan (MAXALT-MLT) 10 MG disintegrating tablet      [START ON 07/08/2022] ARIPiprazole (ABILIFY) 15 MG tablet Take 1 tablet (15 mg total) by mouth at bedtime. 30 tablet 3   metFORMIN (GLUCOPHAGE) 500 MG tablet Take 1 tablet (500 mg total) by mouth every evening. 30 tablet 2   omeprazole (PRILOSEC) 20 MG capsule Take 1 capsule by mouth daily.     [START ON 07/05/2022] sertraline (ZOLOFT) 25 MG tablet Take 1 tablet (25 mg total) by mouth at bedtime. 30 tablet 1   zonisamide (ZONEGRAN) 100 MG capsule Take 3 capsules by mouth daily.     No current facility-administered medications for this visit.     Musculoskeletal: Strength & Muscle Tone: within normal limits Gait & Station: normal Patient leans: N/A  Psychiatric Specialty Exam: Review of Systems  Psychiatric/Behavioral:  Positive for dysphoric mood and sleep  disturbance. Negative for agitation, behavioral problems, confusion, decreased concentration, hallucinations, self-injury and suicidal ideas. The patient is nervous/anxious. The patient is not hyperactive.   All other systems reviewed and are negative.   Blood pressure 124/85, pulse 80, temperature 98.5 F (36.9 C), temperature source Oral, height 5' 2.6" (1.59 m), weight 198 lb 3.2 oz (89.9 kg), last menstrual period 11/10/2017.Body mass index is 35.56 kg/m.  General Appearance: Fairly Groomed  Eye Contact:  Good  Speech:  Clear and Coherent  Volume:  Normal  Mood:  ok  Affect:  Appropriate, Congruent, and calm  Thought Process:  Coherent  Orientation:  Full (Time, Place, and Person)  Thought Content: Logical   Suicidal Thoughts:  No  Homicidal Thoughts:  No  Memory:  Immediate;   Good  Judgement:  Good  Insight:  Good  Psychomotor Activity:  Normal  Concentration:  Concentration: Good and Attention Span: Good  Recall:  Good  Fund of Knowledge: Good  Language: Good  Akathisia:  No  Handed:  Right  AIMS (if indicated): not done  Assets:  Communication Skills Desire for Improvement  ADL's:  Intact  Cognition: WNL  Sleep:  Fair   Screenings: AUDIT    Flowsheet Row Admission (Discharged) from 06/12/2018 in [place] Admission (Discharged) from 03/12/2016 in [place]  Alcohol Use Disorder Identification Test Final Score (AUDIT) 0 0      GAD-7    Flowsheet Row Office Visit from 06/25/2022 in [place] Office Visit from 04/29/2022 in [place] Office Visit from 01/07/2022 in [place]  Total GAD-7 Score '4 9 3      '$ PHQ2-9    [place] Office Visit from 06/25/2022 in [place] Office Visit from 04/29/2022 in [place] Office Visit from 01/07/2022 in [place] Office Visit from 12/06/2021 in [place] Follow [facility]  [facility]  PHQ-2 Total Score '2 1 2 4 '$ 0  PHQ-9 Total Score '4 6 2 9 '$ --        Assessment and Plan:  Havyn Ramo is a 52 y.o. year old female with a history of undifferentiated psychotic symptoms, mild intellectual disability, history of seizure disorder in childhood, adenocarcinoma of the right breast on tamoxifen s/p radiation, hypertension, who presents for follow up appointment for below.   1. Undifferentiated schizophrenia ([place]) 2. Intellectual developmental disorder, mild There has been overall improvement in irritability, paranoia since restarting Abilify.  Will continue current dose of Abilify to target schizophrenia.  Will uptitrate metformin for weight gain associated with antipsychotic use.  Discussed potential GI symptoms.  She is willing to work on diet and physical activity.   3. MDD (major depressive disorder), recurrent episode, in partial remission ([place]) There has been overall improvement since starting sertraline.  Psychosocial stressors includes being concerned about her mother at home , and losses including her uncle.  Will continue current dose to target depression and anxiety.      Plan Continue Abilify 15 mg at night Continue sertraline 25 mg at night  Increase metformin 500 mg in pm Obtain EKG to monitor QTc prolongation Next appointment: 3/21 at 3 PM for 30 mins, in person   The patient demonstrates the following risk factors for suicide: Chronic risk factors for suicide include: psychiatric disorder of schizophrenia . Acute risk factors for suicide include: N/A. Protective factors for this patient include: positive social support. Considering these factors, the overall suicide risk at this point appears to be low. Patient is appropriate for outpatient follow up.         Collaboration of  Care: Collaboration of Care: Other reviewed notes in EPic  Patient/Guardian was advised Release of Information must be obtained prior to any record release in order to collaborate their care with an outside provider. Patient/Guardian was advised if they have not already done so to contact the registration department to sign all necessary forms in order for [place] to release information regarding their care.   Consent: Patient/Guardian gives verbal consent for treatment and assignment of benefits for services provided during this visit. Patient/Guardian expressed understanding and agreed to proceed.    Norman Clay, MD 06/25/2022, 3:02 PM

## 2022-06-25 ENCOUNTER — Encounter: Payer: Self-pay | Admitting: Psychiatry

## 2022-06-25 ENCOUNTER — Ambulatory Visit (INDEPENDENT_AMBULATORY_CARE_PROVIDER_SITE_OTHER): Payer: Medicare Other | Admitting: Psychiatry

## 2022-06-25 VITALS — BP 124/85 | HR 80 | Temp 98.5°F | Ht 62.6 in | Wt 198.2 lb

## 2022-06-25 DIAGNOSIS — F70 Mild intellectual disabilities: Secondary | ICD-10-CM

## 2022-06-25 DIAGNOSIS — F3341 Major depressive disorder, recurrent, in partial remission: Secondary | ICD-10-CM

## 2022-06-25 DIAGNOSIS — F203 Undifferentiated schizophrenia: Secondary | ICD-10-CM

## 2022-06-25 MED ORDER — ARIPIPRAZOLE 15 MG PO TABS: 15.0000 mg | ORAL_TABLET | Freq: Every day | ORAL | 3 refills | Status: DC

## 2022-06-25 MED ORDER — SERTRALINE HCL 25 MG PO TABS: 25.0000 mg | ORAL_TABLET | Freq: Every day | ORAL | 1 refills | Status: DC

## 2022-06-25 MED ORDER — METFORMIN HCL 500 MG PO TABS: 500.0000 mg | ORAL_TABLET | Freq: Every evening | ORAL | 2 refills | Status: DC

## 2022-06-25 NOTE — Patient Instructions (Addendum)
Continue Abilify 15 mg at night Continue sertraline 25 mg at night  Increase metformin 500 mg in pm Obtain EKG Next appointment: 3/21 at 3 PM

## 2022-07-24 ENCOUNTER — Other Ambulatory Visit: Payer: Self-pay

## 2022-07-24 ENCOUNTER — Ambulatory Visit
Admission: RE | Admit: 2022-07-24 | Discharge: 2022-07-24 | Disposition: A | Discharge status: Home / Self Care | Payer: Medicare Other | Source: Ambulatory Visit | Attending: Psychiatry | Admitting: Psychiatry

## 2022-07-24 DIAGNOSIS — F203 Undifferentiated schizophrenia: Secondary | ICD-10-CM | POA: Diagnosis present

## 2022-08-09 ENCOUNTER — Ambulatory Visit
Admission: RE | Admit: 2022-08-09 | Discharge: 2022-08-09 | Disposition: A | Discharge status: Home / Self Care | Payer: Medicare Other | Source: Ambulatory Visit | Attending: General Surgery | Admitting: General Surgery

## 2022-08-09 DIAGNOSIS — Z1231 Encounter for screening mammogram for malignant neoplasm of breast: Secondary | ICD-10-CM | POA: Diagnosis present

## 2022-08-17 ENCOUNTER — Encounter: Payer: Self-pay | Admitting: Oncology

## 2022-09-02 NOTE — Progress Notes (Deleted)
BH MD/PA/NP OP Progress Note  09/02/2022 1:05 PM Karen Beard  MRN:  [id number]  Chief Complaint: No chief complaint on file.  HPI: ***  ekg  Visit Diagnosis: No diagnosis found.  Past Psychiatric History: Please see initial evaluation for full details. I have reviewed the history. No updates at this time.     Past Medical History:  Past Medical History:  Diagnosis Date   Abnormal vaginal bleeding    Breast cancer ([place]) 01/26/2015   Oncoltype score: 3. T1a,No (isolated tumor cells_right breast lumpectomy with rad tx, ER/PR pos, her2 negative, INVASIVE MAMMARY CARCINOMA and DCIS of right breast   Depression    Edema of both legs    Fibroids    GERD (gastroesophageal reflux disease)    High triglycerides    Hypertension    RECENTLY TAKEN OFF OF MEDS PER MOM    Learning disability    Migraine    daily   Personal history of radiation therapy    Seizures ([place])    AS A CHILD-NONE SINCE    Past Surgical History:  Procedure Laterality Date   ABDOMINAL HYSTERECTOMY     AXILLARY HIDRADENITIS EXCISION     unc   AXILLARY SENTINEL NODE BIOPSY Right 02/23/2015   Sentinel lymph node biopsy x4, one with isolated tumor cells.;  Dr Jamal Collin, MD;  [facility] ORS;  ISOLATED TUMOR CELLS IN ONE LYMPH NODE (1/1).   BREAST BIOPSY Right 01/15/2016   stereo Dr Bary Castilla, fat necrosis   BREAST DUCTAL SYSTEM EXCISION Bilateral 01/26/2015   Bilateral retroareolar ductal excision for bloody nipple drainage, DCIS identified on the right   BREAST LUMPECTOMY Right 02/06/2015   Invasive mammary carcinoma.  5 mm, DCIS at margin.  Surgeon: Christene Lye, MD;  Location: [facility] ORS;  Service: General;  Laterality: Right;   CARPAL TUNNEL RELEASE     CARPAL TUNNEL RELEASE Right 05/08/2016   Procedure: OPEN CARPAL TUNNEL RELEASE;  Surgeon: Corky Mull, MD;  Location: [place];  Service: Orthopedics;  Laterality: Right;   CYSTOSCOPY N/A 11/24/2017   Procedure: CYSTOSCOPY;  Surgeon: Benjaman Kindler, MD;  Location: [facility] ORS;  Service: Gynecology;  Laterality: N/A;   EXCISION / BIOPSY BREAST / NIPPLE / DUCT Bilateral 01/26/2015   left breast duct ecstasis. right breast DCIS   INGUINAL HIDRADENITIS EXCISION     LAPAROSCOPIC BILATERAL SALPINGO OOPHERECTOMY Bilateral 11/24/2017   Procedure: LAPAROSCOPIC BILATERAL SALPINGO OOPHORECTOMY;  Surgeon: Benjaman Kindler, MD;  Location: [facility] ORS;  Service: Gynecology;  Laterality: Bilateral;   LAPAROSCOPIC HYSTERECTOMY N/A 11/24/2017   Procedure: HYSTERECTOMY TOTAL LAPAROSCOPIC;  Surgeon: Benjaman Kindler, MD;  Location: [facility] ORS;  Service: Gynecology;  Laterality: N/A;    Family Psychiatric History: Please see initial evaluation for full details. I have reviewed the history. No updates at this time.     Family History:  Family History  Problem Relation Age of Onset   Breast cancer Sister 67   Testicular cancer Father     Social History:  Social History   Socioeconomic History   Marital status: Single    Spouse name: Not on file   Number of children: Not on file   Years of education: Not on file   Highest education level: Not on file  Occupational History   Not on file  Tobacco Use   Smoking status: Never   Smokeless tobacco: Never  Vaping Use   Vaping Use: Never used  Substance and Sexual Activity   Alcohol use: No  Alcohol/week: 0.0 standard drinks of alcohol   Drug use: No   Sexual activity: Not on file  Other Topics Concern   Not on file  Social History Narrative   Not on file   Social Determinants of Health   Financial Resource Strain: Not on file  Food Insecurity: Not on file  Transportation Needs: Not on file  Physical Activity: Not on file  Stress: Not on file  Social Connections: Not on file    Allergies:  Allergies  Allergen Reactions   Sumatriptan Shortness Of Breath   Cephalexin Nausea And Vomiting   Chocolate Swelling   Corn Oil Other (See Comments)    By test. Pt has eaten without issues.    Peanut Oil Swelling and Other (See Comments)    throat   Peanut-Containing Drug Products Swelling   Phenobarbital Other (See Comments)    Hyperactivity, aggitation   Vilazodone Hcl Nausea Only   Duloxetine Hcl Anxiety and Other (See Comments)    agitation   Latex Rash and Other (See Comments)    Bandaids, gloves(when worn)   Sulfa Antibiotics Rash   Tape Rash    Metabolic Disorder Labs: Lab Results  Component Value Date   HGBA1C 5.1 06/12/2018   MPG 99.67 06/12/2018   MPG 100 03/13/2016   No results found for: "PROLACTIN" Lab Results  Component Value Date   CHOL 175 06/12/2018   TRIG 270 (H) 06/12/2018   HDL 31 (L) 06/12/2018   CHOLHDL 5.6 06/12/2018   VLDL 54 (H) 06/12/2018   LDLCALC 90 06/12/2018   LDLCALC 109 (H) 03/13/2016   Lab Results  Component Value Date   TSH 1.721 06/12/2018   TSH 1.864 03/13/2016    Therapeutic Level Labs: No results found for: "LITHIUM" No results found for: "VALPROATE" No results found for: "CBMZ"  Current Medications: Current Outpatient Medications  Medication Sig Dispense Refill   albuterol (VENTOLIN HFA) 108 (90 Base) MCG/ACT inhaler Inhale 2 puffs into the lungs every 6 (six) hours as needed.     ARIPiprazole (ABILIFY) 15 MG tablet Take 1 tablet (15 mg total) by mouth at bedtime. 30 tablet 3   aspirin-acetaminophen-caffeine (EXCEDRIN MIGRAINE) [id number] MG per tablet Take 1 tablet by mouth every 6 (six) hours as needed for headache.      cetirizine (ZYRTEC) 10 MG tablet Take 10 mg by mouth daily as needed for allergies.      hydrochlorothiazide (MICROZIDE) 12.5 MG capsule TAKE 1 CAPSULE BY MOUTH ONCE DAILY. **TAKE WITH LOSARTAN 50 MG TABLET**     Icosapent Ethyl (VASCEPA) 1 g CAPS Take 2 g by mouth daily with lunch.     losartan (COZAAR) 50 MG tablet Take 1 tablet by mouth daily.     metFORMIN (GLUCOPHAGE) 500 MG tablet Take 1 tablet (500 mg total) by mouth every evening. 30 tablet 2   omeprazole (PRILOSEC) 20 MG capsule Take 1  capsule by mouth daily.     rizatriptan (MAXALT-MLT) 10 MG disintegrating tablet      sertraline (ZOLOFT) 25 MG tablet Take 1 tablet (25 mg total) by mouth at bedtime. 30 tablet 1   zonisamide (ZONEGRAN) 100 MG capsule Take 3 capsules by mouth daily.     No current facility-administered medications for this visit.     Musculoskeletal: Strength & Muscle Tone: {desc; muscle tone:32375} Gait & Station: {PE GAIT ED EF:[id number] Patient leans: {Patient Leans:21022755}  Psychiatric Specialty Exam: Review of Systems  Last menstrual period 11/10/2017.There is no height or weight on file  to calculate BMI.  General Appearance: {Appearance:22683}  Eye Contact:  {BHH EYE CONTACT:22684}  Speech:  {Speech:22685}  Volume:  {Volume (PAA):22686}  Mood:  {BHH MOOD:22306}  Affect:  {Affect (PAA):22687}  Thought Process:  {Thought Process (PAA):22688}  Orientation:  {BHH ORIENTATION (PAA):22689}  Thought Content: {Thought Content:22690}   Suicidal Thoughts:  {ST/HT (PAA):22692}  Homicidal Thoughts:  {ST/HT (PAA):22692}  Memory:  {BHH MEMORY:22881}  Judgement:  {Judgement (PAA):22694}  Insight:  {Insight (PAA):22695}  Psychomotor Activity:  {Psychomotor (PAA):22696}  Concentration:  {Concentration:21399}  Recall:  {BHH GOOD/FAIR/POOR:22877}  Fund of Knowledge: {BHH GOOD/FAIR/POOR:22877}  Language: {BHH GOOD/FAIR/POOR:22877}  Akathisia:  {BHH YES OR NO:22294}  Handed:  {Handed:22697}  AIMS (if indicated): {Desc; done/not:10129}  Assets:  {Assets (PAA):22698}  ADL's:  {BHH XO:4411959  Cognition: {chl bhh cognition:304700322}  Sleep:  {BHH GOOD/FAIR/POOR:22877}   Screenings: AUDIT    Flowsheet Row Admission (Discharged) from 06/12/2018 in [place] Admission (Discharged) from 03/12/2016 in [place]  Alcohol Use Disorder Identification Test Final Score (AUDIT) 0 0      GAD-7    Flowsheet Row Office Visit from 06/25/2022 in [place] Office Visit from 04/29/2022 in [place] Office Visit from 01/07/2022 in [place]  Total GAD-7 Score 4 9 3       PHQ2-9    [place] Office Visit from 06/25/2022 in [place] Office Visit from 04/29/2022 in [place] Office Visit from 01/07/2022 in [place] Office Visit from 12/06/2021 in [place] Follow Up  from 11/30/2015 in [place]  PHQ-2 Total Score 2 1 2 4  0  PHQ-9 Total Score 4 6 2 9  --        Assessment and Plan:  Shacoya Cregg is a 52 y.o. year old female with a history of undifferentiated psychotic symptoms, mild intellectual disability, history of seizure disorder in childhood, adenocarcinoma of the right breast on tamoxifen s/p radiation, hypertension, who presents for follow up appointment for below.    1. Undifferentiated schizophrenia ([place]) 2. Intellectual developmental disorder, mild There has been overall improvement in irritability, paranoia since restarting Abilify.  Will continue current dose of Abilify to target schizophrenia.  Will uptitrate metformin for weight gain associated with antipsychotic use.  Discussed potential GI symptoms.  She is willing to work on diet and physical activity.    3. MDD (major depressive disorder), recurrent episode, in partial remission ([place]) There has been overall improvement since starting sertraline.  Psychosocial stressors includes being concerned about her mother at home , and losses including her uncle.  Will continue current dose to target depression and anxiety.      Plan Continue Abilify 15 mg at night Continue sertraline 25 mg at night  Increase metformin 500 mg in pm Obtain EKG to  monitor QTc prolongation Next appointment: 3/21 at 3 PM for 30 mins, in person   The patient demonstrates the following risk factors for suicide: Chronic risk factors for suicide include: psychiatric disorder of schizophrenia . Acute risk factors for suicide include: N/A. Protective factors for this patient include: positive social support. Considering these factors, the overall suicide risk at this point appears to be low. Patient is appropriate for outpatient follow up.       Collaboration of Care: Collaboration of Care: {BH OP Collaboration of [facility]  Patient/Guardian was advised Release of Information must be obtained prior to any record release in order to collaborate their care with an outside provider. Patient/Guardian was advised if they have not already done so to contact the registration department to sign all necessary forms in order for [place] to release information regarding their care.   Consent: Patient/Guardian gives verbal consent for treatment and assignment of benefits for services provided during this visit. Patient/Guardian expressed understanding and agreed to proceed.    Norman Clay, MD 09/02/2022, 1:05 PM

## 2022-09-05 ENCOUNTER — Ambulatory Visit: Payer: Medicare Other | Admitting: Psychiatry

## 2022-09-09 ENCOUNTER — Other Ambulatory Visit: Payer: Self-pay | Admitting: Psychiatry

## 2022-09-11 DIAGNOSIS — R7303 Prediabetes: Secondary | ICD-10-CM | POA: Insufficient documentation

## 2022-09-24 ENCOUNTER — Other Ambulatory Visit: Payer: Self-pay | Admitting: Psychiatry

## 2022-09-30 ENCOUNTER — Ambulatory Visit: Payer: Medicare Other | Admitting: Psychiatry

## 2022-10-02 NOTE — Progress Notes (Signed)
Virtual Visit via Video Note  I connected with Karen Beard on 10/04/22 at 11:30 AM EDT by a video enabled telemedicine application and verified that I am speaking with the correct person using two identifiers.  Location: Patient: home Provider: office Persons participated in the visit- patient, her sister, provider    I discussed the limitations of evaluation and management by telemedicine and the availability of in person appointments. The patient expressed understanding and agreed to proceed.    I discussed the assessment and treatment plan with the patient. The patient was provided an opportunity to ask questions and all were answered. The patient agreed with the plan and demonstrated an understanding of the instructions.   The patient was advised to call back or seek an in-person evaluation if the symptoms worsen or if the condition fails to improve as anticipated.  I provided 20 minutes of non-face-to-face time during this encounter.   Neysa Hotter, MD    [facility] MD/PA/NP OP Progress Note  10/04/2022 12:10 PM Karen Beard  MRN:  [id number]  Chief Complaint:  Chief Complaint  Patient presents with   Follow-up   HPI:  This is a follow-up appointment for schizophrenia and depression.  Her sister is presents to the visit, and helps to elaborate the story.  She states that she has been feeling upset for the past 2 days.  She had a cardiac cath, and it was complicated by stroke and UTI.  She has been watching TV.  She went to the park the other time.  She likes it, and she agrees to try doing it more often to be active.  She is hoping to join Education officer, community program.  She does not feel depressed.  She has fair sleep.  She feels good about the weight loss.  She denies SI.  She denies hallucinations or paranoia.  She takes medication regularly, and feels comfortable to stay on the current medication regimen.  Karen Beard, her sister presents to the visit.  She  thinks Karen Beard has been doing well despite the recent issues with her mother. She thinks Karen Beard is in a good place, and feels comfortable to stay on the current regimen.    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 Intellectual developmental disorder, mild  F70     3. Recurrent major depressive disorder, in partial remission  F33.41       Past Psychiatric History: Please see initial evaluation for full details. I have reviewed the history. No updates at this time.     Past Medical History:  Past Medical History:  Diagnosis Date   Abnormal vaginal bleeding    Breast cancer (HCC) 01/26/2015   Oncoltype score: 3. T1a,No (isolated tumor cells_right breast lumpectomy with rad tx, ER/PR pos, her2 negative, INVASIVE MAMMARY CARCINOMA and DCIS of right breast   Depression    Edema of both legs    Fibroids    GERD (gastroesophageal reflux disease)    High triglycerides    Hypertension    RECENTLY TAKEN OFF OF MEDS PER MOM    Learning disability    Migraine    daily   Personal history of radiation therapy    Seizures (HCC)    AS A  CHILD-NONE SINCE    Past Surgical History:  Procedure Laterality Date   ABDOMINAL HYSTERECTOMY     AXILLARY HIDRADENITIS EXCISION     [facility]   AXILLARY SENTINEL NODE BIOPSY Right 02/23/2015   Sentinel lymph node biopsy x4, one with isolated tumor cells.;  Dr Evette Cristal, MD;  [facility];  ISOLATED TUMOR CELLS IN ONE LYMPH NODE (1/1).   BREAST BIOPSY Right 01/15/2016   stereo Dr Lemar Livings, fat necrosis   BREAST DUCTAL SYSTEM EXCISION Bilateral 01/26/2015   Bilateral retroareolar ductal excision for bloody nipple drainage, DCIS identified on the right   BREAST LUMPECTOMY Right 02/06/2015   Invasive mammary carcinoma.  5 mm, DCIS at margin.  Surgeon: Kieth Brightly, MD;   Location: [facility];  Service: General;  Laterality: Right;   CARPAL TUNNEL RELEASE     CARPAL TUNNEL RELEASE Right 05/08/2016   Procedure: OPEN CARPAL TUNNEL RELEASE;  Surgeon: Christena Flake, MD;  Location: [facility];  Service: Orthopedics;  Laterality: Right;   CYSTOSCOPY N/A 11/24/2017   Procedure: CYSTOSCOPY;  Surgeon: Christeen Douglas, MD;  Location: [facility];  Service: Gynecology;  Laterality: N/A;   EXCISION / BIOPSY BREAST / NIPPLE / DUCT Bilateral 01/26/2015   left breast duct ecstasis. right breast DCIS   INGUINAL HIDRADENITIS EXCISION     LAPAROSCOPIC BILATERAL SALPINGO OOPHERECTOMY Bilateral 11/24/2017   Procedure: LAPAROSCOPIC BILATERAL SALPINGO OOPHORECTOMY;  Surgeon: Christeen Douglas, MD;  Location: [facility];  Service: Gynecology;  Laterality: Bilateral;   LAPAROSCOPIC HYSTERECTOMY N/A 11/24/2017   Procedure: HYSTERECTOMY TOTAL LAPAROSCOPIC;  Surgeon: Christeen Douglas, MD;  Location: [facility];  Service: Gynecology;  Laterality: N/A;    Family Psychiatric History: Please see initial evaluation for full details. I have reviewed the history. No updates at this time.     Family History:  Family History  Problem Relation Age of Onset   Breast cancer Sister 31   Testicular cancer Father     Social History:  Social History   Socioeconomic History   Marital status: Single    Spouse name: Not on file   Number of children: Not on file   Years of education: Not on file   Highest education level: Not on file  Occupational History   Not on file  Tobacco Use   Smoking status: Never   Smokeless tobacco: Never  Vaping Use   Vaping Use: Never used  Substance and Sexual Activity   Alcohol use: No    Alcohol/week: 0.0 standard drinks of alcohol   Drug use: No   Sexual activity: Not on file  Other Topics Concern   Not on file  Social History Narrative   Not on file   Social Determinants of Health   Financial Resource Strain: Not on file  Food Insecurity: Not on  file  Transportation Needs: Not on file  Physical Activity: Not on file  Stress: Not on file  Social Connections: Not on file    Allergies:  Allergies  Allergen Reactions   Sumatriptan Shortness Of Breath   Cephalexin Nausea And Vomiting   Chocolate Swelling   Corn Oil Other (See Comments)    By test. Pt has eaten without issues.   Peanut Oil Swelling and Other (See Comments)    throat   Peanut-Containing Drug Products Swelling   Phenobarbital Other (See Comments)    Hyperactivity, aggitation   Vilazodone Hcl Nausea Only   Duloxetine Hcl Anxiety and Other (See Comments)    agitation   Latex Rash and Other (  See Comments)    Bandaids, gloves(when worn)   Sulfa Antibiotics Rash   Tape Rash    Metabolic Disorder Labs: Lab Results  Component Value Date   HGBA1C 5.1 06/12/2018   MPG 99.67 06/12/2018   MPG 100 03/13/2016   No results found for: "PROLACTIN" Lab Results  Component Value Date   CHOL 175 06/12/2018   TRIG 270 (H) 06/12/2018   HDL 31 (L) 06/12/2018   CHOLHDL 5.6 06/12/2018   VLDL 54 (H) 06/12/2018   LDLCALC 90 06/12/2018   LDLCALC 109 (H) 03/13/2016   Lab Results  Component Value Date   TSH 1.721 06/12/2018   TSH 1.864 03/13/2016    Therapeutic Level Labs: No results found for: "LITHIUM" No results found for: "VALPROATE" No results found for: "CBMZ"  Current Medications: Current Outpatient Medications  Medication Sig Dispense Refill   albuterol (VENTOLIN HFA) 108 (90 Base) MCG/ACT inhaler Inhale 2 puffs into the lungs every 6 (six) hours as needed.     [START ON 11/05/2022] ARIPiprazole (ABILIFY) 15 MG tablet Take 1 tablet (15 mg total) by mouth at bedtime. 30 tablet 3   aspirin-acetaminophen-caffeine (EXCEDRIN MIGRAINE) 250-250-65 MG per tablet Take 1 tablet by mouth every 6 (six) hours as needed for headache.      cetirizine (ZYRTEC) 10 MG tablet Take 10 mg by mouth daily as needed for allergies.      hydrochlorothiazide (MICROZIDE) 12.5 MG  capsule TAKE 1 CAPSULE BY MOUTH ONCE DAILY. **TAKE WITH LOSARTAN 50 MG TABLET**     Icosapent Ethyl (VASCEPA) 1 g CAPS Take 2 g by mouth daily with lunch.     losartan (COZAAR) 50 MG tablet Take 1 tablet by mouth daily.     [START ON 10/24/2022] metFORMIN (GLUCOPHAGE) 500 MG tablet Take 1 tablet (500 mg total) by mouth daily. 30 tablet 3   omeprazole (PRILOSEC) 20 MG capsule Take 1 capsule by mouth daily.     rizatriptan (MAXALT-MLT) 10 MG disintegrating tablet      [START ON 10/09/2022] sertraline (ZOLOFT) 25 MG tablet Take 1 tablet (25 mg total) by mouth at bedtime. 60 tablet 0   zonisamide (ZONEGRAN) 100 MG capsule Take 3 capsules by mouth daily.     No current facility-administered medications for this visit.     Musculoskeletal: Strength & Muscle Tone:  N/A Gait & Station:  N/A Patient leans: N/A  Psychiatric Specialty Exam: Review of Systems  Psychiatric/Behavioral:  Negative for agitation, behavioral problems, confusion, decreased concentration, dysphoric mood, hallucinations, self-injury, sleep disturbance and suicidal ideas. The patient is not nervous/anxious and is not hyperactive.   All other systems reviewed and are negative.   Last menstrual period 11/10/2017.There is no height or weight on file to calculate BMI.  General Appearance: Fairly Groomed  Eye Contact:  Good  Speech:  Clear and Coherent  Volume:  Normal  Mood:   upset  Affect:  Appropriate, Congruent, and calm  Thought Process:  Coherent  Orientation:  Full (Time, Place, and Person)  Thought Content: Logical   Suicidal Thoughts:  No  Homicidal Thoughts:  No  Memory:  Immediate;   Good  Judgement:  Good  Insight:  Good  Psychomotor Activity:  Normal  Concentration:  Concentration: Good and Attention Span: Good  Recall:  Good  Fund of Knowledge: Good  Language: Good  Akathisia:  No  Handed:  Right  AIMS (if indicated): not done  Assets:  Communication Skills Desire for Improvement  ADL's:  Intact   Cognition: WNL  Sleep:  Good   Screenings: AUDIT    Flowsheet Row Admission (Discharged) from 06/12/2018 in [facility] Admission (Discharged) from 03/12/2016 in [facility]  Alcohol Use Disorder Identification Test Final Score (AUDIT) 0 0      GAD-7    Flowsheet Row Office Visit from 06/25/2022 in [facility] Office Visit from 04/29/2022 in [facility] Office Visit from 01/07/2022 in [facility]  Total GAD-7 Score PHQ2-9    Flowsheet Row Office Visit from 06/25/2022 in [facility] Office Visit from 04/29/2022 in [facility] Office Visit from 01/07/2022 in [facility] Office Visit from 12/06/2021 in [facility] Follow Up  from 11[facility]30/[facility]  PHQ-2 Total Score 0  PHQ-9 Total Score --        Assessment and Plan:  Karen Beard is a 52 y.o. year old female with a history of undifferentiated psychotic symptoms, mild intellectual disability, history of seizure disorder in childhood, adenocarcinoma of the right breast on tamoxifen s/p radiation, hypertension, who presents for follow up appointment for below.   1. Undifferentiated schizophrenia 2. Intellectual developmental disorder, mild  History: transferred from [facility]. history of AH, paranoia. Admitted twice for psychosis, last in 2019.  No aggression in the past.previously on Abilify maintena 300, clonazepam 0.5, benadryl 50 daily, haldol 10 mg daily.   There has been no psychotic symptoms since restarting Abilify.  Will continue current dose of Abilify to target schizophrenia.  Continue metformin for weight gain associated  with antipsychotic use.  She is planning to involve in virtual program for weight loss. Will continue to assess this.   3. Recurrent major depressive disorder, in partial remission Acute stressors include: her mother suffering from stroke  Other stressors include: losses including her uncle  Although she reports stress in relation to stressors as above, it has been manageable according to her sister.  Will continue current dose of sertraline to target depression.      Plan Continue Abilify 15 mg at night Continue sertraline 25 mg at night  Continue metformin 500 mg in pm According to both the patient and her sister, she reportedly took EKG late January or late Feb, although it is not reflected in the system.  Will look into this.  Next appointment:6/12 at 11 am for 30 mins, video   The patient demonstrates the following risk factors for suicide: Chronic risk factors for suicide include: psychiatric disorder of schizophrenia . Acute risk factors for suicide include: N/A. Protective factors for this patient include: positive social support. Considering these factors, the overall suicide risk at this point appears to be low. Patient is appropriate for outpatient follow up.       Collaboration of Care: Collaboration of Care: Other reviewed notes in Epic  Patient/Guardian was advised Release of Information must be obtained prior to any record release in order to collaborate their care with an outside provider. Patient/Guardian was advised if they have not already done so to contact the registration department to sign all necessary forms in order for [place] to release information regarding their care.   Consent: Patient/Guardian gives verbal consent for treatment and assignment of benefits for services provided during this visit. Patient/Guardian expressed understanding and  agreed to proceed.    Neysa Hotter, MD 10/04/2022, 12:10 PM

## 2022-10-04 ENCOUNTER — Encounter: Payer: Self-pay | Admitting: Psychiatry

## 2022-10-04 ENCOUNTER — Telehealth (INDEPENDENT_AMBULATORY_CARE_PROVIDER_SITE_OTHER): Payer: Medicare Other | Admitting: Psychiatry

## 2022-10-04 ENCOUNTER — Telehealth: Payer: Self-pay | Admitting: Psychiatry

## 2022-10-04 ENCOUNTER — Encounter: Payer: Self-pay | Admitting: Oncology

## 2022-10-04 DIAGNOSIS — F70 Mild intellectual disabilities: Secondary | ICD-10-CM

## 2022-10-04 DIAGNOSIS — F203 Undifferentiated schizophrenia: Secondary | ICD-10-CM

## 2022-10-04 DIAGNOSIS — F3341 Major depressive disorder, recurrent, in partial remission: Secondary | ICD-10-CM

## 2022-10-04 MED ORDER — METFORMIN HCL 500 MG PO TABS: 500.0000 mg | ORAL_TABLET | Freq: Every day | ORAL | 3 refills | Status: DC

## 2022-10-04 MED ORDER — SERTRALINE HCL 25 MG PO TABS: 25.0000 mg | ORAL_TABLET | Freq: Every day | ORAL | 0 refills | Status: DC

## 2022-10-04 MED ORDER — ARIPIPRAZOLE 15 MG PO TABS: 15.0000 mg | ORAL_TABLET | Freq: Every day | ORAL | 3 refills | Status: DC

## 2022-10-04 NOTE — Telephone Encounter (Signed)
Could you contact the place for EKG at [place]? The patient reportedly underwent an EKG sometime in late January or early February. However, it's not reflected in the system, and I haven't seen any results. Can you look into this? Thanks.

## 2022-10-04 NOTE — Patient Instructions (Signed)
Continue Abilify 15 mg at night Continue sertraline 25 mg at night  Continue metformin 500 mg in pm Next appointment:6/12 at 11 am

## 2022-10-07 NOTE — Telephone Encounter (Signed)
left a message for call back- pending

## 2022-10-08 ENCOUNTER — Telehealth: Payer: Self-pay

## 2022-10-08 NOTE — Telephone Encounter (Signed)
Brayton Caves is calling from the psychiatric department requesting the patient's EKG performed on 02/07 be faxed to [phone]. Please advise.

## 2022-10-08 NOTE — Telephone Encounter (Signed)
Noted, thanks!

## 2022-10-08 NOTE — Telephone Encounter (Signed)
they are going to fax over the results

## 2022-11-19 NOTE — Progress Notes (Signed)
Virtual Visit via Video Note  I connected with Karen Beard on 11/27/22 at 11:00 AM EDT by a video enabled telemedicine application and verified that I am speaking with the correct person using two identifiers.  Location: Patient: home Provider: office Persons participated in the visit- patient, provider    I discussed the limitations of evaluation and management by telemedicine and the availability of in person appointments. The patient expressed understanding and agreed to proceed.    I discussed the assessment and treatment plan with the patient. The patient was provided an opportunity to ask questions and all were answered. The patient agreed with the plan and demonstrated an understanding of the instructions.   The patient was advised to call back or seek an in-person evaluation if the symptoms worsen or if the condition fails to improve as anticipated.  I provided 15 minutes of non-face-to-face time during this encounter.   Karen Hotter, MD     [facility] MD/PA/NP OP Progress Note  11/27/2022 11:36 AM Karen Beard  MRN:  [id number]  Chief Complaint:  Chief Complaint  Patient presents with   Follow-up   HPI:  This is a follow-up appointment for schizophrenia and depression.  She states that she has been doing better.  Her mother has been back home since completing rehab for a few weeks.  She feels happy about this.  She watches TV or computer.  She tries to go out in the sun.  Although she tries to do this every day, it is sometimes harder, although she is unable to elaborate this.  She asks her mother when she was asked if she feels anxious; she said yes afterwards.  She reports slight increase in appetite.  She denies SI.  She denies hallucinations or paranoia. She takes medication regularly.   Karen Beard, her sister presents to the visit.  She states that things has been good overall.  Karen Beard does not have structure in terms of exercise and diet.  She just  does not do exercise. Karen Beard does not necessarily think Karen Beard is struggling with mood symptoms.  She feels comfortable to stay on the current medication as numerous [name] feels comfortable with this.   192 lbs  89.8 kg (198 lb) 09/11/2022 10:45 AM ED   Wt Readings from Last 3 Encounters:  06/25/22 198 lb 3.2 oz (89.9 kg)  04/29/22 198 lb 6.4 oz (90 kg)  01/07/22 172 lb 12.8 oz (78.4 kg)     Visit Diagnosis:    ICD-10-CM   1. 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 Intellectual developmental disorder, mild  F70     3. Recurrent major depressive disorder, in partial remission (HCC)  F33.41       Past Psychiatric History: Please see initial evaluation for full details. I have reviewed the history. No updates at this time.     Past Medical History:  Past Medical History:  Diagnosis Date   Abnormal vaginal bleeding    Breast cancer (HCC) 01/26/2015   Oncoltype score: 3. T1a,No (isolated tumor cells_right breast lumpectomy with rad tx, ER/PR pos, her2 negative, INVASIVE MAMMARY CARCINOMA and DCIS of right breast   Depression    Edema of both legs    Fibroids    GERD (gastroesophageal reflux disease)    High triglycerides    Hypertension    RECENTLY TAKEN OFF OF MEDS PER MOM    Learning disability    Migraine    daily   Personal history of radiation  therapy    Seizures (HCC)    AS A CHILD-NONE SINCE    Past Surgical History:  Procedure Laterality Date   ABDOMINAL HYSTERECTOMY     AXILLARY HIDRADENITIS EXCISION     [facility]   AXILLARY SENTINEL NODE BIOPSY Right 02/23/2015   Sentinel lymph node biopsy x4, one with isolated tumor cells.;  Dr Evette Cristal, MD;  [facility];  ISOLATED TUMOR CELLS IN ONE LYMPH NODE (1/1).   BREAST BIOPSY Right 01/15/2016   stereo Dr Lemar Livings, fat necrosis   BREAST DUCTAL SYSTEM EXCISION Bilateral 01/26/2015   Bilateral retroareolar ductal excision for bloody nipple drainage, DCIS identified on the right   BREAST LUMPECTOMY Right 02/06/2015    Invasive mammary carcinoma.  5 mm, DCIS at margin.  Surgeon: Kieth Brightly, MD;  Location: [facility] ORS;  Service: General;  Laterality: Right;   CARPAL TUNNEL RELEASE     CARPAL TUNNEL RELEASE Right 05/08/2016   Procedure: OPEN CARPAL TUNNEL RELEASE;  Surgeon: Christena Flake, MD;  Location: [facility];  Service: Orthopedics;  Laterality: Right;   CYSTOSCOPY N/A 11/24/2017   Procedure: CYSTOSCOPY;  Surgeon: Christeen Douglas, MD;  Location: [facility] ORS;  Service: Gynecology;  Laterality: N/A;   EXCISION / BIOPSY BREAST / NIPPLE / DUCT Bilateral 01/26/2015   left breast duct ecstasis. right breast DCIS   INGUINAL HIDRADENITIS EXCISION     LAPAROSCOPIC BILATERAL SALPINGO OOPHERECTOMY Bilateral 11/24/2017   Procedure: LAPAROSCOPIC BILATERAL SALPINGO OOPHORECTOMY;  Surgeon: Christeen Douglas, MD;  Location: [facility] ORS;  Service: Gynecology;  Laterality: Bilateral;   LAPAROSCOPIC HYSTERECTOMY N/A 11/24/2017   Procedure: HYSTERECTOMY TOTAL LAPAROSCOPIC;  Surgeon: Christeen Douglas, MD;  Location: [facility];  Service: Gynecology;  Laterality: N/A;    Family Psychiatric History: Please see initial evaluation for full details. I have reviewed the history. No updates at this time.     Family History:  Family History  Problem Relation Age of Onset   Breast cancer Sister 38   Testicular cancer Father     Social History:  Social History   Socioeconomic History   Marital status: Single    Spouse name: Not on file   Number of children: Not on file   Years of education: Not on file   Highest education level: Not on file  Occupational History   Not on file  Tobacco Use   Smoking status: Never   Smokeless tobacco: Never  Vaping Use   Vaping Use: Never used  Substance and Sexual Activity   Alcohol use: No    Alcohol/week: 0.0 standard drinks of alcohol   Drug use: No   Sexual activity: Not on file  Other Topics Concern   Not on file  Social History Narrative   Not on file   Social  Determinants of Health   Financial Resource Strain: Not on file  Food Insecurity: Not on file  Transportation Needs: Not on file  Physical Activity: Not on file  Stress: Not on file  Social Connections: Not on file    Allergies:  Allergies  Allergen Reactions   Sumatriptan Shortness Of Breath   Cephalexin Nausea And Vomiting   Chocolate Swelling   Corn Oil Other (See Comments)    By test. Pt has eaten without issues.   Peanut Oil Swelling and Other (See Comments)    throat   Peanut-Containing Drug Products Swelling   Phenobarbital Other (See Comments)    Hyperactivity, aggitation   Vilazodone Hcl Nausea Only   Duloxetine Hcl Anxiety and Other (See  Comments)    agitation   Latex Rash and Other (See Comments)    Bandaids, gloves(when worn)   Sulfa Antibiotics Rash   Tape Rash    Metabolic Disorder Labs: Lab Results  Component Value Date   HGBA1C 5.1 06/12/2018   MPG 99.67 06/12/2018   MPG 100 03/13/2016   No results found for: "PROLACTIN" Lab Results  Component Value Date   CHOL 175 06/12/2018   TRIG 270 (H) 06/12/2018   HDL 31 (L) 06/12/2018   CHOLHDL 5.6 06/12/2018   VLDL 54 (H) 06/12/2018   LDLCALC 90 06/12/2018   LDLCALC 109 (H) 03/13/2016   Lab Results  Component Value Date   TSH 1.721 06/12/2018   TSH 1.864 03/13/2016    Therapeutic Level Labs: No results found for: "LITHIUM" No results found for: "VALPROATE" No results found for: "CBMZ"  Current Medications: Current Outpatient Medications  Medication Sig Dispense Refill   albuterol (VENTOLIN HFA) 108 (90 Base) MCG/ACT inhaler Inhale 2 puffs into the lungs every 6 (six) hours as needed.     ARIPiprazole (ABILIFY) 15 MG tablet Take 1 tablet (15 mg total) by mouth at bedtime. 30 tablet 3   aspirin-acetaminophen-caffeine (EXCEDRIN MIGRAINE) 250-250-65 MG per tablet Take 1 tablet by mouth every 6 (six) hours as needed for headache.      cetirizine (ZYRTEC) 10 MG tablet Take 10 mg by mouth daily as  needed for allergies.      hydrochlorothiazide (MICROZIDE) 12.5 MG capsule TAKE 1 CAPSULE BY MOUTH ONCE DAILY. **TAKE WITH LOSARTAN 50 MG TABLET**     Icosapent Ethyl (VASCEPA) 1 g CAPS Take 2 g by mouth daily with lunch.     losartan (COZAAR) 50 MG tablet Take 1 tablet by mouth daily.     metFORMIN (GLUCOPHAGE) 500 MG tablet Take 1 tablet (500 mg total) by mouth daily. 30 tablet 3   omeprazole (PRILOSEC) 20 MG capsule Take 1 capsule by mouth daily.     rizatriptan (MAXALT-MLT) 10 MG disintegrating tablet      sertraline (ZOLOFT) 25 MG tablet Take 1 tablet (25 mg total) by mouth at bedtime. 60 tablet 0   zonisamide (ZONEGRAN) 100 MG capsule Take 3 capsules by mouth daily.     No current facility-administered medications for this visit.     Musculoskeletal: Strength & Muscle Tone:  N/A Gait & Station:  N/A Patient leans: N/A  Psychiatric Specialty Exam: Review of Systems  Psychiatric/Behavioral:  Negative for agitation, behavioral problems, confusion, decreased concentration, dysphoric mood, hallucinations, self-injury, sleep disturbance and suicidal ideas. The patient is nervous/anxious. The patient is not hyperactive.   All other systems reviewed and are negative.   Last menstrual period 11/10/2017.There is no height or weight on file to calculate BMI.  General Appearance: Fairly Groomed  Eye Contact:  Good  Speech:  Clear and Coherent  Volume:  Normal  Mood:   better  Affect:  Appropriate, Congruent, and slightly restricted  Thought Process:  Coherent  Orientation:  Full (Time, Place, and Person)  Thought Content: Logical   Suicidal Thoughts:  No  Homicidal Thoughts:  No  Memory:  Immediate;   Good  Judgement:  Good  Insight:  Present  Psychomotor Activity:  Normal  Concentration:  Concentration: Good and Attention Span: Good  Recall:  Good  Fund of Knowledge: Good  Language: Good  Akathisia:  No  Handed:  Right  AIMS (if indicated): not done  Assets:  Communication  Skills Desire for Improvement  ADL's:  Intact  Cognition: WNL  Sleep:  Good   Screenings: AUDIT    Flowsheet Row Admission (Discharged) from 06/12/2018 in [facility] Admission (Discharged) from 03/12/2016 in [facility]  Alcohol Use Disorder Identification Test Final Score (AUDIT) 0 0      GAD-7    Flowsheet Row Office Visit from 06/25/2022 in [facility] Office Visit from 04/29/2022 in [facility] Office Visit from 01/07/2022 in [facility]  Total GAD-7 Score 4 9 3       PHQ2-9    Flowsheet Row Office Visit from 06/25/2022 in [facility] Office Visit from 04/29/2022 in [facility] Office Visit from 01/07/2022 in [facility] Office Visit from 12/06/2021 in [facility] Follow Up  from 11/30/2015 in [facility]  PHQ-2 Total Score 2 1 2 4  0  PHQ-9 Total Score 4 6 2 9  --        Assessment and Plan:  Olethia Fehringer is a 52 y.o. year old female with a history of undifferentiated psychotic symptoms, mild intellectual disability, history of seizure disorder in childhood, adenocarcinoma of the right breast on tamoxifen s/p radiation, hypertension, who presents for follow up appointment for below.   1. Undifferentiated schizophrenia (HCC) 2. Intellectual developmental disorder, mild History: transferred from [facility]. history of AH, paranoia. Admitted twice for psychosis, last in 2019.  No aggression in the past.previously on Abilify maintena 300, clonazepam 0.5, benadryl 50 daily, haldol 10 mg daily.   There has been no psychotic symptoms since restarting Abilify.  Will continue current dose to target schizophrenia.  Will  continue metformin for weight gain associated with antipsychotic use.  Noted that she did report increase in appetite, although she has had weight loss.  Will continue to assess this.   3. Recurrent major depressive disorder, in partial remission (HCC) Acute stressors include: her mother s/p stroke  Other stressors include: losses including her uncle   Overall improvement since her mother returned home, although she continues to struggle with motivation. It is unclear whether it is more related to negative symptoms of schizophrenia.  Will continue current dose of sertraline to target depression at this time.      Plan Continue Abilify 15 mg at night Continue sertraline 25 mg at night  Continue metformin 500 mg in pm According to both the patient and her sister, she reportedly took EKG late January or late Feb, although it is not reflected in the system.  Sent a message to the nurse again to look into this. Next appointment: 8/5 at 9 30 for 30 mins, video   The patient demonstrates the following risk factors for suicide: Chronic risk factors for suicide include: psychiatric disorder of schizophrenia . Acute risk factors for suicide include: N/A. Protective factors for this patient include: positive social support. Considering these factors, the overall suicide risk at this point appears to be low. Patient is appropriate for outpatient follow up.       Collaboration of Care: Collaboration of Care: Other reviewed notes in Epic  Patient/Guardian was advised Release of Information must be obtained prior to any record release in order to collaborate their care with an outside provider. Patient/Guardian was advised if they have not already done so to contact the registration department to sign all necessary forms in order for [place] to release information  regarding their care.   Consent: Patient/Guardian gives verbal consent for treatment and assignment of benefits for services provided during this visit.  Patient/Guardian expressed understanding and agreed to proceed.    Karen Hotter, MD 11/27/2022, 11:36 AM

## 2022-11-27 ENCOUNTER — Telehealth: Payer: Self-pay

## 2022-11-27 ENCOUNTER — Encounter: Payer: Self-pay | Admitting: Psychiatry

## 2022-11-27 ENCOUNTER — Telehealth (INDEPENDENT_AMBULATORY_CARE_PROVIDER_SITE_OTHER): Payer: Medicare Other | Admitting: Psychiatry

## 2022-11-27 DIAGNOSIS — F70 Mild intellectual disabilities: Secondary | ICD-10-CM | POA: Diagnosis not present

## 2022-11-27 DIAGNOSIS — F203 Undifferentiated schizophrenia: Secondary | ICD-10-CM | POA: Diagnosis not present

## 2022-11-27 DIAGNOSIS — F3341 Major depressive disorder, recurrent, in partial remission: Secondary | ICD-10-CM | POA: Diagnosis not present

## 2022-11-27 MED ORDER — SERTRALINE HCL 25 MG PO TABS: 25.0000 mg | ORAL_TABLET | Freq: Every day | ORAL | 1 refills | Status: DC

## 2022-11-27 NOTE — Telephone Encounter (Signed)
called patient her sister picked up the phone and asked if find out if [facility] had done her EKG. asked if she can contact where [facility] had the EKG done and have them fax it to our office.

## 2022-12-02 ENCOUNTER — Telehealth: Payer: Self-pay | Admitting: Psychiatry

## 2022-12-02 NOTE — Telephone Encounter (Signed)
Spoke to [place] the patients sister informed her of the message she voiced understanding

## 2022-12-02 NOTE — Telephone Encounter (Signed)
Reviewed EKG. HR 73, QTc 425 msec 07/2022  Could you inform her or her sister that we have received the EKG results? Based on the findings, I recommend she continues the medication as discussed during her last visit.

## 2023-01-14 ENCOUNTER — Other Ambulatory Visit: Payer: Self-pay | Admitting: Psychiatry

## 2023-01-15 NOTE — Progress Notes (Signed)
Virtual Visit via Video Note  I connected with Karen Beard on 01/20/23 at  9:30 AM EDT by a video enabled telemedicine application and verified that I am speaking with the correct person using two identifiers.  Location: Patient: home Provider: office Persons participated in the visit- patient, provider    I discussed the limitations of evaluation and management by telemedicine and the availability of in person appointments. The patient expressed understanding and agreed to proceed.   I discussed the assessment and treatment plan with the patient. The patient was provided an opportunity to ask questions and all were answered. The patient agreed with the plan and demonstrated an understanding of the instructions.   The patient was advised to call back or seek an in-person evaluation if the symptoms worsen or if the condition fails to improve as anticipated.  I provided 15 minutes of non-face-to-face time during this encounter.   Karen Hotter, MD    [facility] MD/PA/NP OP Progress Note  01/20/2023 10:00 AM Karen Beard  MRN:  [id number]  Chief Complaint:  Chief Complaint  Patient presents with   Follow-up   HPI:  This is a follow-up appointment for schizophrenia, depression. Her mother, sister are in the room.  She answers many of the questions with the help of her sister.  She states that she has been doing fine.  She was able to lose weight after cutting down sweet.  She does not do any exercise, and tends to stay in the room watching TV.  She does enjoy taking care of her dog.  She has occasional bouts of feeling depressed, which has been occurring once a month.  Although she does not know why, she struggles with anhedonia without any triggers.  She sleeps 12 hours.  She feels tired at night.  She denies SI, hallucinations, paranoia.   Karen Beard, her sister presents to the visit.  She thinks Karen Beard has been doing good.  She feels proud that Shan has been able to  cut down sweets despite the stress as she tends to do emotional eating.  Karen Beard appears to be anxious going outside as she is concerned about her mother at home.  Her mother's condition has been the same.  Her mother does not follow things which were advised at times. Both Karen Beard and [place] feels comfortable stay on the current medication regimen.   181 lbs,  Wt Readings from Last 3 Encounters:  06/25/22 198 lb 3.2 oz (89.9 kg)  04/29/22 198 lb 6.4 oz (90 kg)  01/07/22 172 lb 12.8 oz (78.4 kg)      Visit Diagnosis:    ICD-10-CM   1. 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 Intellectual developmental disorder, mild  F70     3. Recurrent major depressive disorder, in partial remission (HCC)  F33.41     4. Fatigue, unspecified type  R53.83 Ambulatory referral to Pulmonology    5. Drug-induced weight gain  R63.5    T50.905A       Past Psychiatric History: Please see initial evaluation for full details. I have reviewed the history. No updates at this time.     Past Medical History:  Past Medical History:  Diagnosis Date   Abnormal vaginal bleeding    Breast cancer (HCC) 01/26/2015   Oncoltype score: 3. T1a,No (isolated tumor cells_right breast lumpectomy with rad tx, ER/PR pos, her2 negative, INVASIVE MAMMARY CARCINOMA and DCIS of right breast   Depression    Edema of both legs  Fibroids    GERD (gastroesophageal reflux disease)    High triglycerides    Hypertension    RECENTLY TAKEN OFF OF MEDS PER MOM    Learning disability    Migraine    daily   Personal history of radiation therapy    Seizures (HCC)    AS A CHILD-NONE SINCE    Past Surgical History:  Procedure Laterality Date   ABDOMINAL HYSTERECTOMY     AXILLARY HIDRADENITIS EXCISION     [facility]   AXILLARY SENTINEL NODE BIOPSY Right 02/23/2015   Sentinel lymph node biopsy x4, one with isolated tumor cells.;  Dr Evette Cristal, MD;  [facility];  ISOLATED TUMOR CELLS IN ONE LYMPH NODE (1/1).   BREAST BIOPSY Right  01/15/2016   stereo Dr Lemar Livings, fat necrosis   BREAST DUCTAL SYSTEM EXCISION Bilateral 01/26/2015   Bilateral retroareolar ductal excision for bloody nipple drainage, DCIS identified on the right   BREAST LUMPECTOMY Right 02/06/2015   Invasive mammary carcinoma.  5 mm, DCIS at margin.  Surgeon: Karen Brightly, MD;  Location: [facility] ORS;  Service: General;  Laterality: Right;   CARPAL TUNNEL RELEASE     CARPAL TUNNEL RELEASE Right 05/08/2016   Procedure: OPEN CARPAL TUNNEL RELEASE;  Surgeon: Christena Flake, MD;  Location: [facility];  Service: Orthopedics;  Laterality: Right;   CYSTOSCOPY N/A 11/24/2017   Procedure: CYSTOSCOPY;  Surgeon: Christeen Douglas, MD;  Location: [facility] ORS;  Service: Gynecology;  Laterality: N/A;   EXCISION / BIOPSY BREAST / NIPPLE / DUCT Bilateral 01/26/2015   left breast duct ecstasis. right breast DCIS   INGUINAL HIDRADENITIS EXCISION     LAPAROSCOPIC BILATERAL SALPINGO OOPHERECTOMY Bilateral 11/24/2017   Procedure: LAPAROSCOPIC BILATERAL SALPINGO OOPHORECTOMY;  Surgeon: Christeen Douglas, MD;  Location: [facility] ORS;  Service: Gynecology;  Laterality: Bilateral;   LAPAROSCOPIC HYSTERECTOMY N/A 11/24/2017   Procedure: HYSTERECTOMY TOTAL LAPAROSCOPIC;  Surgeon: Christeen Douglas, MD;  Location: [facility] ORS;  Service: Gynecology;  Laterality: N/A;    Family Psychiatric History: Please see initial evaluation for full details. I have reviewed the history. No updates at this time.     Family History:  Family History  Problem Relation Age of Onset   Breast cancer Sister 23   Testicular cancer Father     Social History:  Social History   Socioeconomic History   Marital status: Single    Spouse name: Not on file   Number of children: Not on file   Years of education: Not on file   Highest education level: Not on file  Occupational History   Not on file  Tobacco Use   Smoking status: Never   Smokeless tobacco: Never  Vaping Use   Vaping status: Never Used   Substance and Sexual Activity   Alcohol use: No    Alcohol/week: 0.0 standard drinks of alcohol   Drug use: No   Sexual activity: Not on file  Other Topics Concern   Not on file  Social History Narrative   Not on file   Social Determinants of Health   Financial Resource Strain: Patient Declined (09/11/2022)   Received from [facility], [facility]   Overall Financial Resource Strain (CARDIA)    Difficulty of Paying Living Expenses: Patient declined  Food Insecurity: Patient Declined (09/11/2022)   Received from [facility], [facility]   Hunger Vital Sign    Worried About Running Out of Food in the Last Year: Patient declined  Ran Out of Food in the Last Year: Patient declined  Transportation Needs: Patient Declined (09/11/2022)   Received from [facility], [facility]   [facility] - Transportation    In the past 12 months, has lack of transportation kept you from medical appointments or from getting medications?: Patient declined    Lack of Transportation (Non-Medical): Patient declined  Physical Activity: Not on file  Stress: Not on file  Social Connections: Not on file    Allergies:  Allergies  Allergen Reactions   Sumatriptan Shortness Of Breath   Cephalexin Nausea And Vomiting   Chocolate Swelling   Corn Oil Other (See Comments)    By test. Pt has eaten without issues.   Peanut Oil Swelling and Other (See Comments)    throat   Peanut-Containing Drug Products Swelling   Phenobarbital Other (See Comments)    Hyperactivity, aggitation   Vilazodone Hcl Nausea Only   Duloxetine Hcl Anxiety and Other (See Comments)    agitation   Latex Rash and Other (See Comments)    Bandaids, gloves(when worn)   Sulfa Antibiotics Rash   Tape Rash    Metabolic Disorder Labs: Lab Results  Component Value Date   HGBA1C 5.1 06/12/2018   MPG 99.67 06/12/2018   MPG 100  03/13/2016   No results found for: "PROLACTIN" Lab Results  Component Value Date   CHOL 175 06/12/2018   TRIG 270 (H) 06/12/2018   HDL 31 (L) 06/12/2018   CHOLHDL 5.6 06/12/2018   VLDL 54 (H) 06/12/2018   LDLCALC 90 06/12/2018   LDLCALC 109 (H) 03/13/2016   Lab Results  Component Value Date   TSH 1.721 06/12/2018   TSH 1.864 03/13/2016    Therapeutic Level Labs: No results found for: "LITHIUM" No results found for: "VALPROATE" No results found for: "CBMZ"  Current Medications: Current Outpatient Medications  Medication Sig Dispense Refill   albuterol (VENTOLIN HFA) 108 (90 Base) MCG/ACT inhaler Inhale 2 puffs into the lungs every 6 (six) hours as needed.     [START ON 03/05/2023] ARIPiprazole (ABILIFY) 15 MG tablet Take 1 tablet (15 mg total) by mouth at bedtime. 30 tablet 3   aspirin-acetaminophen-caffeine (EXCEDRIN MIGRAINE) 250-250-65 MG per tablet Take 1 tablet by mouth every 6 (six) hours as needed for headache.      cetirizine (ZYRTEC) 10 MG tablet Take 10 mg by mouth daily as needed for allergies.      hydrochlorothiazide (MICROZIDE) 12.5 MG capsule TAKE 1 CAPSULE BY MOUTH ONCE DAILY. **TAKE WITH LOSARTAN 50 MG TABLET**     Icosapent Ethyl (VASCEPA) 1 g CAPS Take 2 g by mouth daily with lunch.     losartan (COZAAR) 50 MG tablet Take 1 tablet by mouth daily.     [START ON 02/21/2023] metFORMIN (GLUCOPHAGE) 500 MG tablet Take 1 tablet (500 mg total) by mouth daily. 30 tablet 3   omeprazole (PRILOSEC) 20 MG capsule Take 1 capsule by mouth daily.     rizatriptan (MAXALT-MLT) 10 MG disintegrating tablet      [START ON 02/06/2023] sertraline (ZOLOFT) 25 MG tablet Take 1 tablet (25 mg total) by mouth at bedtime. 30 tablet 1   zonisamide (ZONEGRAN) 100 MG capsule Take 3 capsules by mouth daily.     No current facility-administered medications for this visit.     Musculoskeletal: Strength & Muscle Tone:  N/A Gait & Station:  N/A Patient leans: N/A  Psychiatric Specialty  Exam: Review of Systems  Last menstrual period 11/10/2017.There  is no height or weight on file to calculate BMI.  General Appearance: Fairly Groomed  Eye Contact:  Good  Speech:  Clear and Coherent  Volume:  Normal  Mood:   fine  Affect:  Appropriate, Congruent, and slightly restricted  Thought Process:  Coherent  Orientation:  Full (Time, Place, and Person)  Thought Content: Logical   Suicidal Thoughts:  No  Homicidal Thoughts:  No  Memory:  Immediate;   Good  Judgement:  Good  Insight:  Good and Fair  Psychomotor Activity:  Normal  Concentration:  Concentration: Good and Attention Span: Good  Recall:  Good  Fund of Knowledge: Good  Language: Good  Akathisia:  No  Handed:  Right  AIMS (if indicated): not done  Assets:  Communication Skills Desire for Improvement  ADL's:  Intact  Cognition: WNL  Sleep:  Fair   Screenings: AUDIT    Flowsheet Row Admission (Discharged) from 06/12/2018 in [facility] Admission (Discharged) from 03/12/2016 in [facility]  Alcohol Use Disorder Identification Test Final Score (AUDIT) 0 0      GAD-7    Flowsheet Row Office Visit from 06/25/2022 in [facility] Office Visit from 04/29/2022 in [facility] Office Visit from 01/07/2022 in [facility]  Total GAD-7 Score 4 9 3       PHQ2-9    Flowsheet Row Office Visit from 06/25/2022 in [facility] Office Visit from 04/29/2022 in [facility] Office Visit from 01/07/2022 in [facility] Office Visit from 12/06/2021 in [facility] Follow Up  [facility]  PHQ-2 Total Score 2 1 2 4  0  PHQ-9 Total Score 4 6 2 9  --         Assessment and Plan:  Marquisha Nikolov is a 53 y.o. year old female with a history of undifferentiated psychotic symptoms, mild intellectual disability, history of seizure disorder in childhood, adenocarcinoma of the right breast on tamoxifen s/p radiation, hypertension, who presents for follow up appointment for below.   1. Undifferentiated schizophrenia (HCC) 2. Intellectual developmental disorder, mild # Drug induced weight gain History: transferred from [facility]. history of AH, paranoia. Admitted twice for psychosis, last in 2019.  No aggression in the past.previously on Abilify maintena 300, clonazepam 0.5, benadryl 50 daily, haldol 10 mg daily.   No psychotic symptoms are reported since restarting Abilify.  Will continue current dose for schizophrenia, along with metformin for weight gain associated with antipsychotic use.   3. Recurrent major depressive disorder, in partial remission (HCC) Acute stressors include: her mother s/p stroke  Other stressors include: losses including her uncle   Although she continues to experience anxiety and depressed mood in relation to her mother's condition, it has been overall improving since starting sertraline.  Will continue current dose to target depression.   4. Fatigue, unspecified type Slightly improving as she had weight loss after working on diet.  She has a reported loud noise at night.  Will make referral for evaluation of sleep apnea.    Plan Continue Abilify 15 mg at night - EKG. HR 73, QTc 425 msec 07/2022  Continue sertraline 25 mg at night  Continue metformin 500 mg in pm Next appointment: 9/30 at 10 am, IP   The patient demonstrates the following risk factors for suicide: Chronic risk  factors for suicide include: psychiatric disorder of schizophrenia . Acute risk factors for suicide include: N/A. Protective factors for this patient include: positive social support. Considering these factors, the overall suicide risk at  this point appears to be low. Patient is appropriate for outpatient follow up.   Collaboration of Care: Collaboration of Care: Other reviewed notes in Epic  Patient/Guardian was advised Release of Information must be obtained prior to any record release in order to collaborate their care with an outside provider. Patient/Guardian was advised if they have not already done so to contact the registration department to sign all necessary forms in order for [place] to release information regarding their care.   Consent: Patient/Guardian gives verbal consent for treatment and assignment of benefits for services provided during this visit. Patient/Guardian expressed understanding and agreed to proceed.    Karen Hotter, MD 01/20/2023, 10:01 AM

## 2023-01-20 ENCOUNTER — Telehealth (INDEPENDENT_AMBULATORY_CARE_PROVIDER_SITE_OTHER): Payer: Medicare Other | Admitting: Psychiatry

## 2023-01-20 ENCOUNTER — Encounter: Payer: Self-pay | Admitting: Psychiatry

## 2023-01-20 DIAGNOSIS — R5383 Other fatigue: Secondary | ICD-10-CM | POA: Diagnosis not present

## 2023-01-20 DIAGNOSIS — F3341 Major depressive disorder, recurrent, in partial remission: Secondary | ICD-10-CM | POA: Diagnosis not present

## 2023-01-20 DIAGNOSIS — F203 Undifferentiated schizophrenia: Secondary | ICD-10-CM | POA: Diagnosis not present

## 2023-01-20 DIAGNOSIS — R635 Abnormal weight gain: Secondary | ICD-10-CM

## 2023-01-20 DIAGNOSIS — F70 Mild intellectual disabilities: Secondary | ICD-10-CM

## 2023-01-20 DIAGNOSIS — T50905A Adverse effect of unspecified drugs, medicaments and biological substances, initial encounter: Secondary | ICD-10-CM

## 2023-01-20 MED ORDER — METFORMIN HCL 500 MG PO TABS: 500.0000 mg | ORAL_TABLET | Freq: Every day | ORAL | 3 refills | Status: DC

## 2023-01-20 MED ORDER — ARIPIPRAZOLE 15 MG PO TABS: 15.0000 mg | ORAL_TABLET | Freq: Every day | ORAL | 3 refills | Status: DC

## 2023-01-20 MED ORDER — SERTRALINE HCL 25 MG PO TABS: 25.0000 mg | ORAL_TABLET | Freq: Every day | ORAL | 1 refills | Status: DC

## 2023-01-20 NOTE — Patient Instructions (Signed)
Continue Abilify 15 mg at night  Continue sertraline 25 mg at night  Continue metformin 500 mg in pm Next appointment: 9/30 at 10 am

## 2023-03-04 ENCOUNTER — Other Ambulatory Visit: Payer: Self-pay | Admitting: Psychiatry

## 2023-03-10 NOTE — Progress Notes (Signed)
BH MD/PA/NP OP Progress Note  03/17/2023 12:41 PM Karen Beard  MRN:  [id number]  Chief Complaint:  Chief Complaint  Patient presents with   Follow-up   HPI:  This is a follow-up appointment for schizophrenia, depression and fatigue.  She states that she has been doing fine.  She helps her mother to go to places such as [employer].  She agrees that she has been more active lately, and she enjoys the time.  She is not too much concerned about her mother anymore.  She feels anxious when she drives to certain places, although she denies anxiety at home.  She denies feeling depressed lately.  She has fair sleep.  No hallucinations, paranoia are reported.  She has been drinking four soft drinks. She agrees to cut the amount, considering her recent weight gain.   Her sister, [name] presents to the visit.  Their mother had some episodes of confusion.  Although she is not better, not worse.  Hooria has been going to places such as fall festival, and shopping. She denies other concern at this time;.    181 lbs Wt Readings from Last 3 Encounters:  03/17/23 193 lb 9.6 oz (87.8 kg)  06/25/22 198 lb 3.2 oz (89.9 kg)  04/29/22 198 lb 6.4 oz (90 kg)    Visit Diagnosis:    ICD-10-CM   1. 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 Intellectual developmental disorder, mild  F70     3. Drug-induced weight gain  R63.5    T50.905A     4. Recurrent major depressive disorder, in partial remission (HCC)  F33.41       Past Psychiatric History: Please see initial evaluation for full details. I have reviewed the history. No updates at this time.     Past Medical History:  Past Medical History:  Diagnosis Date   Abnormal vaginal bleeding    Breast cancer (HCC) 01/26/2015   Oncoltype score: 3. T1a,No (isolated tumor cells_right breast lumpectomy with rad tx, ER/PR pos, her2 negative, INVASIVE MAMMARY CARCINOMA and DCIS of right breast   Depression    Edema of both legs     Fibroids    GERD (gastroesophageal reflux disease)    High triglycerides    Hypertension    RECENTLY TAKEN OFF OF MEDS PER MOM    Learning disability    Migraine    daily   Personal history of radiation therapy    Seizures (HCC)    AS A CHILD-NONE SINCE    Past Surgical History:  Procedure Laterality Date   ABDOMINAL HYSTERECTOMY     AXILLARY HIDRADENITIS EXCISION     [facility]   AXILLARY SENTINEL NODE BIOPSY Right 02/23/2015   Sentinel lymph node biopsy x4, one with isolated tumor cells.;  Dr Evette Cristal, MD;  [facility];  ISOLATED TUMOR CELLS IN ONE LYMPH NODE (1/1).   BREAST BIOPSY Right 01/15/2016   stereo Dr Lemar Livings, fat necrosis   BREAST DUCTAL SYSTEM EXCISION Bilateral 01/26/2015   Bilateral retroareolar ductal excision for bloody nipple drainage, DCIS identified on the right   BREAST LUMPECTOMY Right 02/06/2015   Invasive mammary carcinoma.  5 mm, DCIS at margin.  Surgeon: Kieth Brightly, MD;  Location: [facility] OR[facility];  Service: General;  Laterality: Right;   CARPAL TUNNEL RELEASE     CARPAL TUNNEL RELEASE Right 05/08/2016   Procedure: OPEN CARPAL TUNNEL RELEASE;  Surgeon: Christena Flake, MD;  Location: [facility];  Service: Orthopedics;  Laterality: Right;  CYSTOSCOPY N/A 11/24/2017   Procedure: CYSTOSCOPY;  Surgeon: Christeen Douglas, MD;  Location: [facility];  Service: Gynecology;  Laterality: N/A;   EXCISION / BIOPSY BREAST / NIPPLE / DUCT Bilateral 01/26/2015   left breast duct ecstasis. right breast DCIS   INGUINAL HIDRADENITIS EXCISION     LAPAROSCOPIC BILATERAL SALPINGO OOPHERECTOMY Bilateral 11/24/2017   Procedure: LAPAROSCOPIC BILATERAL SALPINGO OOPHORECTOMY;  Surgeon: Christeen Douglas, MD;  Location: [facility];  Service: Gynecology;  Laterality: Bilateral;   LAPAROSCOPIC HYSTERECTOMY N/A 11/24/2017   Procedure: HYSTERECTOMY TOTAL LAPAROSCOPIC;  Surgeon: Christeen Douglas, MD;  Location: [facility];  Service: Gynecology;  Laterality: N/A;    Family Psychiatric History: Please  see initial evaluation for full details. I have reviewed the history. No updates at this time.     Family History:  Family History  Problem Relation Age of Onset   Breast cancer Sister 42   Testicular cancer Father     Social History:  Social History   Socioeconomic History   Marital status: Single    Spouse name: Not on file   Number of children: Not on file   Years of education: Not on file   Highest education level: Not on file  Occupational History   Not on file  Tobacco Use   Smoking status: Never   Smokeless tobacco: Never  Vaping Use   Vaping status: Never Used  Substance and Sexual Activity   Alcohol use: No    Alcohol/week: 0.0 standard drinks of alcohol   Drug use: No   Sexual activity: Not on file  Other Topics Concern   Not on file  Social History Narrative   Not on file   Social Determinants of Health   Financial Resource Strain: Patient Declined (09/11/2022)   Received from [facility], [facility]   Overall Financial Resource Strain (CARDIA)    Difficulty of Paying Living Expenses: Patient declined  Food Insecurity: Patient Declined (09/11/2022)   Received from [facility], [facility]   Hunger Vital Sign    Worried About Running Out of Food in the Last Year: Patient declined    Ran Out of Food in the Last Year: Patient declined  Transportation Needs: Patient Declined (09/11/2022)   Received from [facility], [facility]   PRAPARE - Transportation    In the past 12 months, has lack of transportation kept you from medical appointments or from getting medications?: Patient declined    Lack of Transportation (Non-Medical): Patient declined  Physical Activity: Not on file  Stress: Not on file  Social Connections: Not on file    Allergies:  Allergies  Allergen Reactions   Sumatriptan Shortness Of Breath   Cephalexin Nausea And Vomiting    Chocolate Swelling   Corn Oil Other (See Comments)    By test. Pt has eaten without issues.   Peanut Oil Swelling and Other (See Comments)    throat   Peanut-Containing Drug Products Swelling   Phenobarbital Other (See Comments)    Hyperactivity, aggitation   Vilazodone Hcl Nausea Only   Duloxetine Hcl Anxiety and Other (See Comments)    agitation   Latex Rash and Other (See Comments)    Bandaids, gloves(when worn)   Sulfa Antibiotics Rash   Tape Rash    Metabolic Disorder Labs: Lab Results  Component Value Date   HGBA1C 5.1 06/12/2018   MPG 99.67 06/12/2018   MPG 100 03/13/2016   No results found for: "  PROLACTIN" Lab Results  Component Value Date   CHOL 175 06/12/2018   TRIG 270 (H) 06/12/2018   HDL 31 (L) 06/12/2018   CHOLHDL 5.6 06/12/2018   VLDL 54 (H) 06/12/2018   LDLCALC 90 06/12/2018   LDLCALC 109 (H) 03/13/2016   Lab Results  Component Value Date   TSH 1.721 06/12/2018   TSH 1.864 03/13/2016    Therapeutic Level Labs: No results found for: "LITHIUM" No results found for: "VALPROATE" No results found for: "CBMZ"  Current Medications: Current Outpatient Medications  Medication Sig Dispense Refill   albuterol (VENTOLIN HFA) 108 (90 Base) MCG/ACT inhaler Inhale 2 puffs into the lungs every 6 (six) hours as needed.     ARIPiprazole (ABILIFY) 15 MG tablet Take 1 tablet (15 mg total) by mouth at bedtime. 30 tablet 3   aspirin-acetaminophen-caffeine (EXCEDRIN MIGRAINE) 250-250-65 MG per tablet Take 1 tablet by mouth every 6 (six) hours as needed for headache.      cetirizine (ZYRTEC) 10 MG tablet Take 10 mg by mouth daily as needed for allergies.      hydrochlorothiazide (MICROZIDE) 12.5 MG capsule TAKE 1 CAPSULE BY MOUTH ONCE DAILY. **TAKE WITH LOSARTAN 50 MG TABLET**     Icosapent Ethyl (VASCEPA) 1 g CAPS Take 2 g by mouth daily with lunch.     losartan (COZAAR) 50 MG tablet Take 1 tablet by mouth daily.     metFORMIN (GLUCOPHAGE) 500 MG tablet Take 1 tablet  (500 mg total) by mouth daily. 30 tablet 3   rizatriptan (MAXALT-MLT) 10 MG disintegrating tablet      sertraline (ZOLOFT) 25 MG tablet Take 1 tablet (25 mg total) by mouth at bedtime. 30 tablet 1   omeprazole (PRILOSEC) 20 MG capsule Take 1 capsule by mouth daily.     zonisamide (ZONEGRAN) 100 MG capsule Take 3 capsules by mouth daily.     No current facility-administered medications for this visit.     Musculoskeletal: Strength & Muscle Tone: within normal limits Gait & Station: normal Patient leans: N/A  Psychiatric Specialty Exam: Review of Systems  Psychiatric/Behavioral:  Negative for agitation, behavioral problems, confusion, decreased concentration, dysphoric mood, hallucinations, self-injury, sleep disturbance and suicidal ideas. The patient is nervous/anxious. The patient is not hyperactive.   All other systems reviewed and are negative.   Blood pressure (!) 148/88, pulse 76, temperature 98.7 F (37.1 C), temperature source Temporal, height 5' 2.6" (1.59 m), weight 193 lb 9.6 oz (87.8 kg), last menstrual period 11/10/2017, SpO2 98%.Body mass index is 34.73 kg/m.  General Appearance: Well Groomed  Eye Contact:  Good  Speech:  Clear and Coherent  Volume:  Normal  Mood:   good  Affect:  Appropriate, Congruent, and calm  Thought Process:  Coherent  Orientation:  Full (Time, Place, and Person)  Thought Content: Logical   Suicidal Thoughts:  No  Homicidal Thoughts:  No  Memory:  Immediate;   Good  Judgement:  Good  Insight:  Good  Psychomotor Activity:  Normal  Concentration:  Concentration: Good and Attention Span: Good  Recall:  Good  Fund of Knowledge: Good  Language: Good  Akathisia:  No  Handed:  Right  AIMS (if indicated): not done  Assets:  Communication Skills Desire for Improvement  ADL's:  Intact  Cognition: WNL  Sleep:  Fair   Screenings: AUDIT    Flowsheet Row Admission (Discharged) from 06/12/2018 in [facility] Admission  (Discharged) from 03/12/2016 in [facility]  Alcohol Use Disorder Identification  Test Final Score (AUDIT) 0 0      GAD-7    Flowsheet Row Office Visit from 06/25/2022 in [facility] Office Visit from 04/29/2022 in [facility] Office Visit from 01/07/2022 in [facility]  Total GAD-7 Score 4 9 3       PHQ2-9    Flowsheet Row Office Visit from 06/25/2022 in [facility] Office Visit from 04/29/2022 in [facility] Office Visit from 01/07/2022 in [facility] Office Visit from 12/06/2021 in [facility] Follow Up  from 11/30/2015 [facility]  PHQ-2 Total Score 2 1 2 4  0  PHQ-9 Total Score 4 6 2 9  --        Assessment and Plan:  Tammara Massing is a 52 y.o. year old female with a history of undifferentiated psychotic symptoms, mild intellectual disability, history of seizure disorder in childhood, adenocarcinoma of the right breast on tamoxifen s/p radiation, hypertension, who presents for follow up appointment for below.    1. Undifferentiated schizophrenia (HCC) 2. Intellectual developmental disorder, mild 3. Drug-induced weight gain History: transferred from [facility]. history of AH, paranoia. Admitted twice for psychosis, last in 2019.  No aggression in the past.previously on Abilify maintena 300, clonazepam 0.5, benadryl 50 daily, haldol 10 mg daily.   Stable.  No psychotic symptoms reported since restarting Abilify.  Will continue current dose for schizophrenia.  Will continue metformin for weight gain associated with antipsychotic use.  Noted that she has gained weight for the past few months, although she was successful in weight loss several  months prior. She is willing to work on limiting soft drinks.   4. Recurrent major depressive disorder, in partial remission (HCC) Acute stressors include: her mother s/p stroke  Other stressors include: losses including her uncle    Overall improving except self-limiting anxiety.  Will continue current dose of sertraline to target depression, anxiety.    4. Fatigue, unspecified type Referral was sent for sleep evaluation given her history of sleeping with loud noise.   Plan Continue Abilify 15 mg at night - EKG. HR 73, QTc 425 msec 07/2022  Continue sertraline 25 mg at night  Continue metformin 500 mg in pm Next appointment: 11/25 at 8:30, IP   The patient demonstrates the following risk factors for suicide: Chronic risk factors for suicide include: psychiatric disorder of schizophrenia . Acute risk factors for suicide include: N/A. Protective factors for this patient include: positive social support. Considering these factors, the overall suicide risk at this point appears to be low. Patient is appropriate for outpatient follow up.  Collaboration of Care: Collaboration of Care: Other reviewed notes in Epic  Patient/Guardian was advised Release of Information must be obtained prior to any record release in order to collaborate their care with an outside provider. Patient/Guardian was advised if they have not already done so to contact the registration department to sign all necessary forms in order for [place] to release information regarding their care.   Consent: Patient/Guardian gives verbal consent for treatment and assignment of benefits for services provided during this visit. Patient/Guardian expressed understanding and agreed to proceed.    Neysa Hotter, MD 03/17/2023, 12:41 PM

## 2023-03-12 ENCOUNTER — Encounter: Payer: Self-pay | Admitting: Oncology

## 2023-03-17 ENCOUNTER — Ambulatory Visit (INDEPENDENT_AMBULATORY_CARE_PROVIDER_SITE_OTHER): Payer: Medicare Other | Admitting: Psychiatry

## 2023-03-17 ENCOUNTER — Encounter: Payer: Self-pay | Admitting: Psychiatry

## 2023-03-17 VITALS — BP 148/88 | HR 76 | Temp 98.7°F | Ht 62.6 in | Wt 193.6 lb

## 2023-03-17 DIAGNOSIS — F70 Mild intellectual disabilities: Secondary | ICD-10-CM

## 2023-03-17 DIAGNOSIS — F3341 Major depressive disorder, recurrent, in partial remission: Secondary | ICD-10-CM

## 2023-03-17 DIAGNOSIS — R635 Abnormal weight gain: Secondary | ICD-10-CM

## 2023-03-17 DIAGNOSIS — F203 Undifferentiated schizophrenia: Secondary | ICD-10-CM

## 2023-03-17 DIAGNOSIS — T50905A Adverse effect of unspecified drugs, medicaments and biological substances, initial encounter: Secondary | ICD-10-CM

## 2023-03-17 MED ORDER — SERTRALINE HCL 25 MG PO TABS: 25.0000 mg | ORAL_TABLET | Freq: Every day | ORAL | 1 refills | Status: DC

## 2023-03-17 NOTE — Patient Instructions (Signed)
Continue Abilify 15 mg at night  Continue sertraline 25 mg at night  Continue metformin 500 mg in pm Next appointment: 11/25 at 8:30

## 2023-03-26 ENCOUNTER — Institutional Professional Consult (permissible substitution): Payer: Medicare Other | Admitting: Nurse Practitioner

## 2023-05-08 NOTE — Progress Notes (Addendum)
BH MD/PA/NP OP Progress Note  05/12/2023 10:38 AM Karen Beard  MRN:  [id number]  Chief Complaint:  Chief Complaint  Patient presents with   Follow-up   HPI:  This is a follow-up appointment for schizophrenia, depression.  She states that she has been doing fine.  She went to a restaurant with her mother, she had a fair time.  Her mother has been doing good.  She states that she has been feeling depressed, and unable to do things at times. She agrees that her anxiety is better compared to before. The patient has mood symptoms as in PHQ-9/GAD-7. She sleeps from 9 pm through 10 am. She denies SI.  She denies hallucinations, paranoia.   Karen Beard, her sister presents to the visit.  She states that she feels conflicted.  Karen Beard has gone out to [employer], and went out with her mother the other day. However, even Karen Beard advised her to go out that she will take care of their mother, Karen Beard would not. She declined to go to festival together when Karen Beard invited her.  Although Karen Beard is appreciative of what Karen Beard does (such as cleaning), she reports her own stress of handling appointments and others. Karen Beard thinks Karen Beard had a weight loss when she was working on diet. She thinks Karen Beard appears to have more fatigue for the past few weeks.  Wt Readings from Last 3 Encounters:  05/12/23 196 lb 6.4 oz (89.1 kg)  05/09/23 196 lb 9.6 oz (89.2 kg)  03/17/23 193 lb 9.6 oz (87.8 kg)   192 lbs  89.8 kg (198 lb) 09/11/2022 10:45 AM ED       Wt Readings from Last 3 Encounters:  06/25/22 198 lb 3.2 oz (89.9 kg)  04/29/22 198 lb 6.4 oz (90 kg)  01/07/22 172 lb 12.8 oz (78.4 kg)   12/06/21 175 lb 9.6 oz (79.7 kg)  02/29/20 192 lb 8 oz (87.3 kg)  08/20/19 207 lb 12.8 oz (94.3 kg)    Visit Diagnosis:    ICD-10-CM   1. 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 Intellectual developmental disorder, mild  F70     3. Drug-induced weight gain  R63.5 Amb ref to Medical  Nutrition Therapy-MNT   T50.905A     4. Recurrent major depressive disorder, in partial remission (HCC)  F33.41     5. Fatigue, unspecified type  R53.83       Past Psychiatric History: Please see initial evaluation for full details. I have reviewed the history. No updates at this time.     Past Medical History:  Past Medical History:  Diagnosis Date   Abnormal vaginal bleeding    Breast cancer (HCC) 01/26/2015   Oncoltype score: 3. T1a,No (isolated tumor cells_right breast lumpectomy with rad tx, ER/PR pos, her2 negative, INVASIVE MAMMARY CARCINOMA and DCIS of right breast   Depression    Edema of both legs    Fibroids    GERD (gastroesophageal reflux disease)    High triglycerides    Hypertension    RECENTLY TAKEN OFF OF MEDS PER MOM    Learning disability    Migraine    daily   Personal history of radiation therapy    Seizures (HCC)    AS A CHILD-NONE SINCE    Past Surgical History:  Procedure Laterality Date   ABDOMINAL HYSTERECTOMY     AXILLARY HIDRADENITIS EXCISION     [facility]   AXILLARY SENTINEL NODE BIOPSY Right 02/23/2015   Sentinel lymph node biopsy x4,  one with isolated tumor cells.;  Dr Evette Cristal, MD;  [facility];  ISOLATED TUMOR CELLS IN ONE LYMPH NODE (1/1).   BREAST BIOPSY Right 01/15/2016   stereo Dr Lemar Livings, fat necrosis   BREAST DUCTAL SYSTEM EXCISION Bilateral 01/26/2015   Bilateral retroareolar ductal excision for bloody nipple drainage, DCIS identified on the right   BREAST LUMPECTOMY Right 02/06/2015   Invasive mammary carcinoma.  5 mm, DCIS at margin.  Surgeon: Kieth Brightly, MD;  Location: [facility] ORS;  Service: General;  Laterality: Right;   CARPAL TUNNEL RELEASE     CARPAL TUNNEL RELEASE Right 05/08/2016   Procedure: OPEN CARPAL TUNNEL RELEASE;  Surgeon: Christena Flake, MD;  Location: [facility];  Service: Orthopedics;  Laterality: Right;   CYSTOSCOPY N/A 11/24/2017   Procedure: CYSTOSCOPY;  Surgeon: Christeen Douglas, MD;  Location: [facility] OR[facility];   Service: Gynecology;  Laterality: N/A;   EXCISION / BIOPSY BREAST / NIPPLE / DUCT Bilateral 01/26/2015   left breast duct ecstasis. right breast DCIS   INGUINAL HIDRADENITIS EXCISION     LAPAROSCOPIC BILATERAL SALPINGO OOPHERECTOMY Bilateral 11/24/2017   Procedure: LAPAROSCOPIC BILATERAL SALPINGO OOPHORECTOMY;  Surgeon: Christeen Douglas, MD;  Location: [facility] ORS;  Service: Gynecology;  Laterality: Bilateral;   LAPAROSCOPIC HYSTERECTOMY N/A 11/24/2017   Procedure: HYSTERECTOMY TOTAL LAPAROSCOPIC;  Surgeon: Christeen Douglas, MD;  Location: [facility];  Service: Gynecology;  Laterality: N/A;    Family Psychiatric History: Please see initial evaluation for full details. I have reviewed the history. No updates at this time.     Family History:  Family History  Problem Relation Age of Onset   Breast cancer Sister 69   Testicular cancer Father     Social History:  Social History   Socioeconomic History   Marital status: Single    Spouse name: Not on file   Number of children: Not on file   Years of education: Not on file   Highest education level: Not on file  Occupational History   Not on file  Tobacco Use   Smoking status: Never   Smokeless tobacco: Never  Vaping Use   Vaping status: Never Used  Substance and Sexual Activity   Alcohol use: No    Alcohol/week: 0.0 standard drinks of alcohol   Drug use: No   Sexual activity: Not on file  Other Topics Concern   Not on file  Social History Narrative   Not on file   Social Determinants of Health   Financial Resource Strain: Patient Declined (09/11/2022)   Received from [facility], [facility]   Overall Financial Resource Strain (CARDIA)    Difficulty of Paying Living Expenses: Patient declined  Food Insecurity: Patient Declined (09/11/2022)   Received [facility], [facility]   Hunger Vital Sign    Worried About Running Out of Food in the Last Year:  Patient declined    Ran Out of Food in the Last Year: Patient declined  Transportation Needs: Patient Declined (09/11/2022)   Received from [facility], [facility]   PRAPARE - Transportation    In the past 12 months, has lack of transportation kept you from medical appointments or from getting medications?: Patient declined    Lack of Transportation (Non-Medical): Patient declined  Physical Activity: Not on file  Stress: Not on file  Social Connections: Not on file    Allergies:  Allergies  Allergen Reactions   Sumatriptan Shortness Of Breath  Cephalexin Nausea And Vomiting   Chocolate Swelling   Corn Oil Other (See Comments)    By test. Pt has eaten without issues.   Peanut Oil Swelling and Other (See Comments)    throat   Peanut-Containing Drug Products Swelling   Phenobarbital Other (See Comments)    Hyperactivity, aggitation   Vilazodone Hcl Nausea Only   Duloxetine Hcl Anxiety and Other (See Comments)    agitation   Latex Rash and Other (See Comments)    Bandaids, gloves(when worn)   Sulfa Antibiotics Rash   Tape Rash    Metabolic Disorder Labs: Lab Results  Component Value Date   HGBA1C 5.1 06/12/2018   MPG 99.67 06/12/2018   MPG 100 03/13/2016   No results found for: "PROLACTIN" Lab Results  Component Value Date   CHOL 175 06/12/2018   TRIG 270 (H) 06/12/2018   HDL 31 (L) 06/12/2018   CHOLHDL 5.6 06/12/2018   VLDL 54 (H) 06/12/2018   LDLCALC 90 06/12/2018   LDLCALC 109 (H) 03/13/2016   Lab Results  Component Value Date   TSH 1.721 06/12/2018   TSH 1.864 03/13/2016    Therapeutic Level Labs: No results found for: "LITHIUM" No results found for: "VALPROATE" No results found for: "CBMZ"  Current Medications: Current Outpatient Medications  Medication Sig Dispense Refill   albuterol (VENTOLIN HFA) 108 (90 Base) MCG/ACT inhaler Inhale 2 puffs into the lungs every 6 (six) hours as needed.      aspirin-acetaminophen-caffeine (EXCEDRIN MIGRAINE) 250-250-65 MG per tablet Take 1 tablet by mouth every 6 (six) hours as needed for headache.      cetirizine (ZYRTEC) 10 MG tablet Take 10 mg by mouth daily as needed for allergies.      hydrochlorothiazide (MICROZIDE) 12.5 MG capsule TAKE 1 CAPSULE BY MOUTH ONCE DAILY. **TAKE WITH LOSARTAN 50 MG TABLET**     Icosapent Ethyl (VASCEPA) 1 g CAPS Take 2 g by mouth daily with lunch.     losartan (COZAAR) 50 MG tablet Take 1 tablet by mouth daily.     rizatriptan (MAXALT-MLT) 10 MG disintegrating tablet      [START ON 07/03/2023] ARIPiprazole (ABILIFY) 15 MG tablet Take 1 tablet (15 mg total) by mouth at bedtime. 30 tablet 3   [START ON 06/21/2023] metFORMIN (GLUCOPHAGE) 500 MG tablet Take 1 tablet (500 mg total) by mouth daily. 30 tablet 3   omeprazole (PRILOSEC) 20 MG capsule Take 1 capsule by mouth daily.     [START ON 06/06/2023] sertraline (ZOLOFT) 25 MG tablet Take 1 tablet (25 mg total) by mouth at bedtime. 30 tablet 1   zonisamide (ZONEGRAN) 100 MG capsule Take 3 capsules by mouth daily.     No current facility-administered medications for this visit.     Musculoskeletal: Strength & Muscle Tone: within normal limits Gait & Station: normal Patient leans: N/A  Psychiatric Specialty Exam: Review of Systems  Psychiatric/Behavioral:  Positive for dysphoric mood and sleep disturbance. Negative for agitation, behavioral problems, confusion, decreased concentration, hallucinations, self-injury and suicidal ideas. The patient is nervous/anxious. The patient is not hyperactive.   All other systems reviewed and are negative.   Blood pressure 122/86, pulse 75, temperature 98.5 F (36.9 C), temperature source Temporal, height 5' 2.6" (1.59 m), weight 196 lb 6.4 oz (89.1 kg), last menstrual period 11/10/2017, SpO2 97%.Body mass index is 35.24 kg/m.  General Appearance: Well Groomed  Eye Contact:  Good  Speech:  Clear and Coherent  Volume:  Normal   Mood:  Depressed  Affect:  Appropriate, Congruent, and Restricted  Thought Process:  Coherent  Orientation:  Full (Time, Place, and Person)  Thought Content: Logical   Suicidal Thoughts:  No  Homicidal Thoughts:  No  Memory:  Immediate;   Good  Judgement:  Good  Insight:  Fair  Psychomotor Activity:  Normal  Concentration:  Concentration: Good and Attention Span: Good  Recall:  Good  Fund of Knowledge: Good  Language: Good  Akathisia:  No  Handed:  Right  AIMS (if indicated): Normal tone, no rigidity, no resting/postural tremors, no tardive dyskinesia    Assets:  Communication Skills Desire for Improvement  ADL's:  Intact  Cognition: WNL  Sleep:  Fair   Screenings: AUDIT    Flowsheet Row Admission (Discharged) from 06/12/2018 in [facility] Admission (Discharged) from 03/12/2016 in [facility]  Alcohol Use Disorder Identification Test Final Score (AUDIT) 0 0      GAD-7    Flowsheet Row Office Visit from 05/12/2023 in [facility] Office Visit from 06/25/2022 in [facility] Office Visit from 04/29/2022 in [facility] Office Visit from 01/07/2022 in [facility]  Total GAD-7 Score 7 4 9 3       PHQ2-9    Flowsheet Row Office Visit from 05/12/2023 in [facility] Office Visit from 06/25/2022 in [facility] Office Visit from 04/29/2022 in [facility] Office Visit from 01/07/2022 in [facility] Office Visit from 12/06/2021 in [facility]  PHQ-2 Total Score 1 2 1 2 4   PHQ-9 Total Score -- 4 6 2 9         Assessment and Plan:  Mystica Cederquist is a 52 y.o. year old female  with a history of undifferentiated psychotic symptoms, mild intellectual disability, history of seizure disorder in childhood, adenocarcinoma of the right breast on tamoxifen s/p radiation, hypertension, who presents for follow up appointment for below.    1. Undifferentiated schizophrenia (HCC) 2. Intellectual developmental disorder, mild 3. Drug-induced weight gain History: transferred from [facility]. history of AH, paranoia. Admitted twice for psychosis, last in 2019.  No aggression in the past.previously on Abilify maintenance 300, clonazepam 0.5, benadryl 50 daily, haldol 10 mg daily.   Stable.  She denies any psychotic symptoms since restarting Abilify.  Will continue current dose for schizophrenia, and metformin for weight gain associated with antipsychotic use.  Noted that she has not had weight gain this year, although she was successful in weight loss last year while being on Abilify.  She is willing to see a nutritionist.  She agrees for behavioral activation.   4. Recurrent major depressive disorder, in partial remission (HCC) Acute stressors include: her mother s/p stroke  Other stressors include: losses including her uncle    Her sister reports concern about her not engaging in activities, although there has been slight improvement.  Although will greatly benefit from CBT, this may not be feasible due to time constraints.  Will continue current dose of sertraline to target depression and anxiety.   5. Fatigue, unspecified type Unchanged. She is under evaluation by a sleep specialist.  While adverse reaction from medication cannot be ruled out, she has symptoms of sleep apnea. Will continue to assess.    Plan Continue Abilify 15 mg at night - EKG. HR 73, QTc 425 msec  07/2022  Continue sertraline 25 mg at night  Continue metformin 500 mg in pm Next appointment: 1/20 at 8:30, IP   The patient demonstrates the following risk factors for suicide: Chronic risk factors for suicide  include: psychiatric disorder of schizophrenia . Acute risk factors for suicide include: N/A. Protective factors for this patient include: positive social support. Considering these factors, the overall suicide risk at this point appears to be low. Patient is appropriate for outpatient follow up.  Collaboration of Care: Collaboration of Care: Other Chart reviewed  Patient/Guardian was advised Release of Information must be obtained prior to any record release in order to collaborate their care with an outside provider. Patient/Guardian was advised if they have not already done so to contact the registration department to sign all necessary forms in order for [place] to release information regarding their care.   Consent: Patient/Guardian gives verbal consent for treatment and assignment of benefits for services provided during this visit. Patient/Guardian expressed understanding and agreed to proceed.    Neysa Hotter, MD 05/12/2023, 10:38 AM

## 2023-05-09 ENCOUNTER — Encounter: Payer: Self-pay | Admitting: Nurse Practitioner

## 2023-05-09 ENCOUNTER — Ambulatory Visit: Payer: Medicare Other | Admitting: Nurse Practitioner

## 2023-05-09 VITALS — BP 118/78 | HR 82 | Temp 97.5°F | Ht 62.6 in | Wt 196.6 lb

## 2023-05-09 DIAGNOSIS — E66812 Obesity, class 2: Secondary | ICD-10-CM | POA: Diagnosis not present

## 2023-05-09 DIAGNOSIS — G4719 Other hypersomnia: Secondary | ICD-10-CM | POA: Insufficient documentation

## 2023-05-09 DIAGNOSIS — F203 Undifferentiated schizophrenia: Secondary | ICD-10-CM | POA: Diagnosis not present

## 2023-05-09 NOTE — Assessment & Plan Note (Signed)
BMI 35. Healthy weight loss encouraged.

## 2023-05-09 NOTE — Assessment & Plan Note (Signed)
She has excessive daytime sleepiness, morning headaches, restless sleep. BMI 35. History of HTN. Given this,  I am concerned she could have sleep disordered breathing with obstructive sleep apnea. She will need sleep study for further evaluation.    - discussed how weight can impact sleep and risk for sleep disordered breathing - discussed options to assist with weight loss: combination of diet modification, cardiovascular and strength training exercises   - had an extensive discussion regarding the adverse health consequences related to untreated sleep disordered breathing - specifically discussed the risks for hypertension, coronary artery disease, cardiac dysrhythmias, cerebrovascular disease, and diabetes - lifestyle modification discussed   - discussed how sleep disruption can increase risk of accidents, particularly when driving - safe driving practices were discussed  Patient Instructions  Given your symptoms, I am concerned that you may have sleep disordered breathing with sleep apnea. You will need a sleep study for further evaluation. Someone will contact you to schedule this.   We discussed how untreated sleep apnea puts an individual at risk for cardiac arrhthymias, pulm HTN, DM, stroke and increases their risk for daytime accidents. We also briefly reviewed treatment options including weight loss, side sleeping position, oral appliance, CPAP therapy or referral to ENT for possible surgical options  Use caution when driving and pull over if you become sleepy.  Follow up in 6-8 weeks with Katie Caila Cirelli,NP to go over sleep study results, or sooner, if needed

## 2023-05-09 NOTE — Patient Instructions (Signed)
Given your symptoms, I am concerned that you may have sleep disordered breathing with sleep apnea. You will need a sleep study for further evaluation. Someone will contact you to schedule this.   We discussed how untreated sleep apnea puts an individual at risk for cardiac arrhthymias, pulm HTN, DM, stroke and increases their risk for daytime accidents. We also briefly reviewed treatment options including weight loss, side sleeping position, oral appliance, CPAP therapy or referral to ENT for possible surgical options  Use caution when driving and pull over if you become sleepy.  Follow up in 6-8 weeks with Katie Gerri Acre,NP to go over sleep study results, or sooner, if needed

## 2023-05-09 NOTE — Progress Notes (Signed)
@Patient  ID: Karen Beard, female    DOB: 06-Mar-1971, 52 y.o.   MRN: [id number]  Chief Complaint  Patient presents with   Consult    Fatigue.     Referring provider: Jerl Mina, MD  HPI: 52 year old female, never smoker referred for sleep consult.  Past medical history significant for hypertension, migraines, depression, IDA, schizophrenia, mild intellectual disability, history of stage Ia right breast cancer.  TEST/EVENTS:   05/09/2023: Today - sleep consult Discussed the use of AI scribe software for clinical note transcription with the patient, who gave verbal consent to proceed.  History of Present Illness   The patient presents today with her sister, who helps to provide history, for sleep consult. She was referred by her psychiatrist for a sleep study due to excessive daytime sleepiness. She reports feeling tired at times, but also attributes the excessive sleep to a lack of daytime activities and boredom. The patient is currently caring for her mother, which may contribute to her fatigue. The patient is unsure about snoring but denies waking up gasping for air or choking. Upon waking, she expresses a desire to return to sleep. She reports daily morning headaches. The patient denies sleepwalking or episodes of sleep paralysis. No history of narcolepsy or symptoms of cataplexy. No drowsy driving.   Goes to bed between 9 to 10 PM.  Can take her a few hours to fall asleep some nights.  Other nights she falls asleep quickly.  Wakes 3 times a night.  Usually gets up around 9 AM.  Does not currently work.  Her weight is up 20 pounds over the last 2 years.  She never had previously study.  Does not use CPAP.  No supplemental oxygen use.  She has a history of hypertension, controlled on antihypertensives.  No history of stroke or diabetes.  She does have a history of depression and schizophrenia.  She is on Abilify.  She follows with a psychiatrist.  She is a never smoker.   Does not drink any alcohol.  No signs of caffeine intake.  Lives with her mother.  No reported family history.  Epworth 4   Allergies  Allergen Reactions   Sumatriptan Shortness Of Breath   Cephalexin Nausea And Vomiting   Chocolate Swelling   Corn Oil Other (See Comments)    By test. Pt has eaten without issues.   Peanut Oil Swelling and Other (See Comments)    throat   Peanut-Containing Drug Products Swelling   Phenobarbital Other (See Comments)    Hyperactivity, aggitation   Vilazodone Hcl Nausea Only   Duloxetine Hcl Anxiety and Other (See Comments)    agitation   Latex Rash and Other (See Comments)    Bandaids, gloves(when worn)   Sulfa Antibiotics Rash   Tape Rash    Immunization History  Administered Date(s) Administered   Influenza,inj,Quad PF,6+ Mos 03/11/2017, 03/01/2019   Influenza-Unspecified 02/22/2012, 05/07/2013, 03/28/2014, 04/07/2015, 03/07/2016, 03/11/2017, 02/16/2023    Past Medical History:  Diagnosis Date   Abnormal vaginal bleeding    Breast cancer (HCC) 01/26/2015   Oncoltype score: 3. 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 30 tablet 3   aspirin-acetaminophen-caffeine (EXCEDRIN MIGRAINE) 250-250-65 MG per tablet Take 1 tablet by mouth every 6 (six) hours as  needed for headache.      cetirizine (ZYRTEC) 10 MG tablet Take 10 mg by mouth daily as needed for allergies.      hydrochlorothiazide (MICROZIDE) 12.5 MG capsule TAKE 1 CAPSULE BY MOUTH ONCE DAILY. **TAKE WITH LOSARTAN 50 MG TABLET**     Icosapent Ethyl (VASCEPA) 1 g CAPS Take 2 g by mouth daily with lunch.     losartan (COZAAR) 50 MG tablet Take 1 tablet by mouth daily.     metFORMIN (GLUCOPHAGE) 500 MG tablet Take 1 tablet (500 mg total) by mouth daily. 30 tablet 3   rizatriptan (MAXALT-MLT) 10 MG disintegrating tablet      sertraline (ZOLOFT) 25 MG tablet Take 1 tablet (25 mg total) by mouth at bedtime. 30 tablet 1   albuterol (VENTOLIN HFA) 108 (90 Base) MCG/ACT inhaler Inhale 2 puffs into the lungs every 6 (six) hours as needed. (Patient not taking: Reported on 05/09/2023)     omeprazole (PRILOSEC) 20 MG capsule Take 1 capsule by mouth daily.     zonisamide (ZONEGRAN) 100 MG capsule Take 3 capsules by mouth daily.     No facility-administered medications prior to visit.     Review of Systems:   Constitutional: No night sweats, fevers, chills, or lassitude. +weight gain, fatigue HEENT: No difficulty swallowing, tooth/dental problems, or sore throat. No sneezing, itching, ear ache, nasal congestion, or post nasal drip. +headaches CV:  No chest pain, orthopnea, PND, swelling in lower extremities, anasarca, dizziness, palpitations, syncope Resp: No shortness of breath with exertion or at rest. No excess mucus or change in color of mucus. No productive or non-productive. No hemoptysis. No wheezing.  No chest wall deformity GI:  +heartburn, indigestion (controlled with PPI). No abdominal pain, nausea, vomiting, diarrhea, change in bowel habits, loss of appetite, bloody stools.  GU: No nocturia Skin: No rash, lesions, ulcerations MSK:  No joint pain or swelling.  Neuro: No dizziness or lightheadedness.  Psych: +stable depression. No anxiety. No SI/HI. Mood stable. +sleep disturbance      Physical Exam:  BP 118/78 (BP Location: Right Arm, Cuff Size: Normal)   Pulse 82   Temp (!) 97.5 F (36.4 C)   Ht 5' 2.6" (1.59 m)   Wt 196 lb 9.6 oz (89.2 kg)   LMP 11/10/2017   SpO2 94%   BMI 35.27 kg/m   GEN: Pleasant, well-kempt; obese; in no acute distress HEENT:  Normocephalic and atraumatic. PERRLA. Sclera white. Nasal turbinates pink, moist and patent bilaterally. No rhinorrhea present. Oropharynx pink and moist, without exudate or edema. No lesions, ulcerations, or postnasal drip. Mallampati III NECK:  Supple w/ fair ROM. No JVD present. Normal carotid impulses w/o bruits. Thyroid symmetrical with no goiter or nodules palpated. No lymphadenopathy.   CV: RRR, no m/r/g, no peripheral edema. Pulses intact, +2 bilaterally. No cyanosis, pallor or clubbing. PULMONARY:  Unlabored, regular breathing. Clear bilaterally A&P w/o wheezes/rales/rhonchi. No accessory muscle use.  GI:  BS present and normoactive. Soft, non-tender to palpation. No organomegaly or masses detected.  MSK: No erythema, warmth or tenderness. Cap refil <2 sec all extrem. No deformities or joint swelling noted.  Neuro: A/Ox3. No focal deficits noted.   Skin: Warm, no lesions or rashe Psych: Flat affect. Normal behavior. Judgement and thought content appropriate.     Lab Results:  CBC    Component Value Date/Time   WBC 6.1 06/11/2018 1531   RBC 5.14 (H) 06/11/2018 1531   HGB 13.2 06/11/2018 1531   HGB 8.5 (L) 02/14/2015 1516   HCT 40.9 06/11/2018 1531   HCT 28.4 (L) 02/14/2015 1516   PLT 279 06/11/2018 1531   PLT 383 (H) 02/14/2015 1516   MCV 79.6 (L) 06/11/2018 1531   MCV 68 (L) 02/14/2015 1516   MCH 25.7 (L) 06/11/2018 1531   MCHC 32.3 06/11/2018 1531   RDW 13.4 06/11/2018 1531   RDW 16.2 (H) 02/14/2015 1516   LYMPHSABS 1.3 06/11/2018 1531   LYMPHSABS 1.4 02/14/2015 1516   MONOABS 0.4 06/11/2018 1531   EOSABS 0.5 06/11/2018 1531   EOSABS 1.6 (H) 02/14/2015 1516   BASOSABS 0.0 06/11/2018  1531   BASOSABS 0.1 02/14/2015 1516    BMET    Component Value Date/Time   NA 138 06/11/2018 1531   NA 142 02/14/2015 1516   K 2.8 (L) 06/11/2018 1531   CL 102 06/11/2018 1531   CO2 29 06/11/2018 1531   GLUCOSE 108 (H) 06/11/2018 1531   BUN 14 06/11/2018 1531   BUN 13 02/14/2015 1516   CREATININE 0.90 05/24/2019 1439   CALCIUM 8.7 (L) 06/11/2018 1531   GFRNONAA >60 06/11/2018 1531   GFRAA >60 06/11/2018 1531    BNP    Component Value Date/Time   BNP 11.0 04/01/2018 1605     Imaging:  No results found.  Administration History     None           No data to display          No results found for: "NITRICOXIDE"      Assessment & Plan:   Excessive daytime sleepiness She has excessive daytime sleepiness, morning headaches, restless sleep. BMI 35. History of HTN. Given this,  I am concerned she could have sleep disordered breathing with obstructive sleep apnea. She will need sleep study for further evaluation.    - discussed how weight can impact sleep and risk for sleep disordered breathing - discussed options to assist with weight loss: combination of diet modification, cardiovascular and strength training exercises   - had an extensive discussion regarding the adverse health consequences related to untreated sleep disordered breathing - specifically discussed the risks for hypertension, coronary artery disease, cardiac dysrhythmias, cerebrovascular disease, and diabetes - lifestyle modification discussed   - discussed how sleep disruption can increase risk of accidents, particularly when driving - safe driving practices were discussed  Patient Instructions  Given your symptoms, I am concerned that you may have sleep disordered breathing with sleep apnea. You will need a sleep study for further evaluation. Someone will contact you to schedule this.   We discussed how untreated sleep apnea puts an individual at risk for cardiac arrhthymias, pulm HTN, DM,  stroke and increases their risk for daytime accidents. We also briefly reviewed treatment options including weight loss, side sleeping position, oral appliance, CPAP therapy or referral to ENT for possible surgical options  Use caution when driving and pull over if you become sleepy.  Follow up in  6-8 weeks with Florentina Addison Jaki Hammerschmidt,NP to go over sleep study results, or sooner, if needed     Undifferentiated schizophrenia (HCC) On Abilify, which is likely contributing to daytime sleepiness and weight gain. Her depression symptoms could also be playing a factor. Mood stable. See above. Follow up with psychiatry as scheduled  Obesity, Class II, BMI 35-39.9 BMI 35. Healthy weight loss encouraged   Advised if symptoms do not improve or worsen, to please contact office for sooner follow up or seek emergency care.   I spent 35 minutes of dedicated to the care of this patient on the date of this encounter to include pre-visit review of records, face-to-face time with the patient discussing conditions above, post visit ordering of testing, clinical documentation with the electronic health record, making appropriate referrals as documented, and communicating necessary findings to members of the patients care team.  Noemi Chapel, NP 05/09/2023  Pt aware and understands NP's role.

## 2023-05-09 NOTE — Assessment & Plan Note (Signed)
On Abilify, which is likely contributing to daytime sleepiness and weight gain. Her depression symptoms could also be playing a factor. Mood stable. See above. Follow up with psychiatry as scheduled

## 2023-05-12 ENCOUNTER — Encounter: Payer: Self-pay | Admitting: Psychiatry

## 2023-05-12 ENCOUNTER — Ambulatory Visit (INDEPENDENT_AMBULATORY_CARE_PROVIDER_SITE_OTHER): Payer: Medicare Other | Admitting: Psychiatry

## 2023-05-12 VITALS — BP 122/86 | HR 75 | Temp 98.5°F | Ht 62.6 in | Wt 196.4 lb

## 2023-05-12 DIAGNOSIS — F70 Mild intellectual disabilities: Secondary | ICD-10-CM | POA: Diagnosis not present

## 2023-05-12 DIAGNOSIS — F203 Undifferentiated schizophrenia: Secondary | ICD-10-CM | POA: Diagnosis not present

## 2023-05-12 DIAGNOSIS — R635 Abnormal weight gain: Secondary | ICD-10-CM | POA: Diagnosis not present

## 2023-05-12 DIAGNOSIS — T50905A Adverse effect of unspecified drugs, medicaments and biological substances, initial encounter: Secondary | ICD-10-CM

## 2023-05-12 DIAGNOSIS — F3341 Major depressive disorder, recurrent, in partial remission: Secondary | ICD-10-CM

## 2023-05-12 DIAGNOSIS — R5383 Other fatigue: Secondary | ICD-10-CM

## 2023-05-12 MED ORDER — ARIPIPRAZOLE 15 MG PO TABS: 15.0000 mg | ORAL_TABLET | Freq: Every day | ORAL | 3 refills | Status: DC

## 2023-05-12 MED ORDER — METFORMIN HCL 500 MG PO TABS: 500.0000 mg | ORAL_TABLET | Freq: Every day | ORAL | 3 refills | Status: DC

## 2023-05-12 MED ORDER — SERTRALINE HCL 25 MG PO TABS: 25.0000 mg | ORAL_TABLET | Freq: Every day | ORAL | 1 refills | Status: DC

## 2023-05-12 NOTE — Patient Instructions (Signed)
Continue Abilify 15 mg at night  Continue sertraline 25 mg at night  Continue metformin 500 mg  Next appointment: 1/20 at 8:30

## 2023-05-13 ENCOUNTER — Encounter: Payer: Self-pay | Admitting: *Deleted

## 2023-06-15 DIAGNOSIS — G4719 Other hypersomnia: Secondary | ICD-10-CM

## 2023-06-17 ENCOUNTER — Ambulatory Visit
Admission: RE | Admit: 2023-06-17 | Discharge: 2023-06-17 | Disposition: A | Discharge status: Home / Self Care | Payer: Medicare Other | Attending: Gastroenterology | Admitting: Gastroenterology

## 2023-06-17 ENCOUNTER — Encounter: Payer: Self-pay | Admitting: Anesthesiology

## 2023-06-17 ENCOUNTER — Encounter
Admission: RE | Disposition: A | Discharge status: Home / Self Care | Payer: Self-pay | Source: Home / Self Care | Attending: Gastroenterology

## 2023-06-17 DIAGNOSIS — Z1211 Encounter for screening for malignant neoplasm of colon: Secondary | ICD-10-CM | POA: Diagnosis present

## 2023-06-17 DIAGNOSIS — Z538 Procedure and treatment not carried out for other reasons: Secondary | ICD-10-CM | POA: Insufficient documentation

## 2023-06-17 HISTORY — DX: Hidradenitis suppurativa: L73.2

## 2023-06-17 HISTORY — DX: Abnormal uterine and vaginal bleeding, unspecified: N93.9

## 2023-06-17 HISTORY — DX: Anemia, unspecified: D64.9

## 2023-06-17 HISTORY — DX: Schizophrenia, unspecified: F20.9

## 2023-06-17 HISTORY — DX: Iron deficiency anemia, unspecified: D50.9

## 2023-06-17 HISTORY — DX: Mild intellectual disabilities: F70

## 2023-06-17 HISTORY — DX: Carpal tunnel syndrome, bilateral upper limbs: G56.03

## 2023-06-17 HISTORY — PX: COLONOSCOPY WITH PROPOFOL: SHX5780

## 2023-06-17 HISTORY — DX: Prediabetes: R73.03

## 2023-06-17 HISTORY — DX: Allergy status to unspecified drugs, medicaments and biological substances: Z88.9

## 2023-06-17 HISTORY — DX: Unspecified ovarian cyst, right side: N83.201

## 2023-06-17 SURGERY — COLONOSCOPY WITH PROPOFOL
Anesthesia: General

## 2023-06-17 MED ORDER — SODIUM CHLORIDE 0.9 % IV SOLN: INTRAVENOUS | Status: DC

## 2023-06-17 NOTE — Anesthesia Preprocedure Evaluation (Signed)
 Anesthesia Evaluation  Patient identified by MRN, date of birth, ID band Patient awake    Reviewed: Allergy & Precautions, H&P , NPO status , Patient's Chart, lab work & pertinent test results  Airway Mallampati: II  TM Distance: >3 FB Neck ROM: full    Dental no notable dental hx.    Pulmonary neg pulmonary ROS   Pulmonary exam normal        Cardiovascular hypertension, Normal cardiovascular exam     Neuro/Psych  PSYCHIATRIC DISORDERS  Depression  Schizophrenia  Learning disabilitynegative neurological ROS     GI/Hepatic Neg liver ROS,GERD  ,,  Endo/Other  negative endocrine ROS    Renal/GU negative Renal ROS  negative genitourinary   Musculoskeletal   Abdominal Normal abdominal exam  (+)   Peds  Hematology  (+) Blood dyscrasia, anemia   Anesthesia Other Findings Past Medical History: No date: Abnormal uterine bleeding No date: Abnormal vaginal bleeding No date: Allergic genetic state No date: Anemia 01/26/2015: Breast cancer (HCC)     Comment:  Oncoltype score: 3. 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 No date: Carpal tunnel syndrome on both sides No date: Cysts of both ovaries No date: Depression No date: Edema of both legs No date: Fibroids No date: GERD (gastroesophageal reflux disease) No date: Hidradenitis No date: High triglycerides No date: Hypertension     Comment:  RECENTLY TAKEN OFF OF MEDS PER MOM  No date: Iron deficiency anemia No date: Learning disability No date: Migraine     Comment:  daily No date: Mild intellectual disabilities No date: Personal history of radiation therapy No date: Pre-diabetes No date: Schizophrenia (HCC) No date: Seizures (HCC)     Comment:  AS A CHILD-NONE SINCE  Past Surgical History: No date: ABDOMINAL HYSTERECTOMY No date:  AXILLARY HIDRADENITIS EXCISION     Comment:  unc 02/23/2015: AXILLARY SENTINEL NODE BIOPSY; Right     Comment:  Sentinel lymph node biopsy x4, one with isolated tumor               cells.;  Dr Dellie, MD;  [facility];  ISOLATED TUMOR CELLS               IN ONE LYMPH NODE (1/1). 01/15/2016: BREAST BIOPSY; Right     Comment:  stereo Dr Dessa, fat necrosis 01/26/2015: BREAST DUCTAL SYSTEM EXCISION; Bilateral     Comment:  Bilateral retroareolar ductal excision for bloody nipple              drainage, DCIS identified on the right 02/06/2015: BREAST LUMPECTOMY; Right     Comment:  Invasive mammary carcinoma.  5 mm, DCIS at margin.                Surgeon: Louanne KANDICE Dellie, MD;  Location: [facility];               Service: General;  Laterality: Right; No date: CARPAL TUNNEL RELEASE 05/08/2016: CARPAL TUNNEL RELEASE; Right     Comment:  Procedure: OPEN CARPAL TUNNEL RELEASE;  Surgeon: Norleen JINNY Maltos, MD;  Location: [facility];  Service:  Orthopedics;  Laterality: Right; 11/24/2017: CYSTOSCOPY; N/A     Comment:  Procedure: CYSTOSCOPY;  Surgeon: Verdon Keen, MD;                Location: [facility];  Service: Gynecology;  Laterality:               N/A; No date: CYSTOSCOPY 01/26/2015: EXCISION / BIOPSY BREAST / NIPPLE / DUCT; Bilateral     Comment:  left breast duct ecstasis. right breast DCIS No date: INGUINAL HIDRADENITIS EXCISION 11/24/2017: LAPAROSCOPIC BILATERAL SALPINGO OOPHERECTOMY; Bilateral     Comment:  Procedure: LAPAROSCOPIC BILATERAL SALPINGO OOPHORECTOMY;              Surgeon: Verdon Keen, MD;  Location: [facility];                Service: Gynecology;  Laterality: Bilateral; 11/24/2017: LAPAROSCOPIC HYSTERECTOMY; N/A     Comment:  Procedure: HYSTERECTOMY TOTAL LAPAROSCOPIC;  Surgeon:               Verdon Keen, MD;  Location: [facility];  Service:               Gynecology;  Laterality: N/A;     Reproductive/Obstetrics negative OB  ROS                              Anesthesia Physical Anesthesia Plan  ASA: 2  Anesthesia Plan: General   Post-op Pain Management:    Induction:   PONV Risk Score and Plan: Propofol  infusion and TIVA  Airway Management Planned: Natural Airway  Additional Equipment:   Intra-op Plan:   Post-operative Plan:   Informed Consent:      Dental Advisory Given  Plan Discussed with: CRNA and Surgeon  Anesthesia Plan Comments:          Anesthesia Quick Evaluation

## 2023-06-17 NOTE — OR Nursing (Signed)
 Patient stated she was unable to finish the prep. She was able to drink the first bottle but unable to stomach the second/vomited it up. Patient stated last bowel movement was liquid brown. Patient requests different type of prep as she cannot tolerate the prep that was prescribed for this procedure. This RN spoke with patient's guardian (sister, Rosaline) and relayed information as to reason of cancellation and to contact Dr. Calton office to reschedule and request different type of prep. Sister stated understanding.

## 2023-06-19 ENCOUNTER — Encounter: Payer: Self-pay | Admitting: Gastroenterology

## 2023-06-20 ENCOUNTER — Ambulatory Visit: Payer: Medicare Other | Admitting: Nurse Practitioner

## 2023-06-23 ENCOUNTER — Other Ambulatory Visit: Payer: Self-pay | Admitting: Psychiatry

## 2023-06-23 NOTE — Telephone Encounter (Signed)
 Called pharmacy as instructed by provider spoke to Jarales she stated that the patient just picked up the metformin 30 day supply and she does have a refill on file

## 2023-06-23 NOTE — Telephone Encounter (Signed)
 Please contact the pharmacy and verify- there should be an order for this

## 2023-06-30 NOTE — Progress Notes (Signed)
BH MD/PA/NP OP Progress Note  07/07/2023 9:03 AM Karen Beard  MRN:  [id number]  Chief Complaint:  Chief Complaint  Patient presents with   Follow-up   HPI:  This is a follow-up appointment for schizophrenia, depression.  She states that she lost her dog.  She feels bad about this.  However, she thinks she has been doing good otherwise.  She is planning to make a photo album.  She does not go outside as it is cold.  She had insomnia last night, although it has been good otherwise.  She denies change in appetite.  She denies SI.  She denies hallucinations or paranoia.   [name], her sister presents to the visit.  She thinks Ensleigh has been doing better. She is not zoning out as she used to.  She goes to the grocery store.  She wonders if she has been able to do more now that their mother has been doing better.     Wt Readings from Last 3 Encounters:  07/07/23 197 lb (89.4 kg)  05/12/23 196 lb 6.4 oz (89.1 kg)  05/09/23 196 lb 9.6 oz (89.2 kg)     89.8 kg (198 lb) 09/11/2022 10:45 AM ED         Wt Readings from Last 3 Encounters:  06/25/22 198 lb 3.2 oz (89.9 kg)  04/29/22 198 lb 6.4 oz (90 kg)  01/07/22 172 lb 12.8 oz (78.4 kg)    12/06/21 175 lb 9.6 oz (79.7 kg)  02/29/20 192 lb 8 oz (87.3 kg)  08/20/19 207 lb 12.8 oz (94.3 kg)    Visit Diagnosis:    ICD-10-CM   1. 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 Intellectual developmental disorder, mild  F70     3. Drug-induced weight gain  R63.5    T50.905A     4. Recurrent major depressive disorder, in partial remission (HCC)  F33.41       Past Psychiatric History: Please see initial evaluation for full details. I have reviewed the history. No updates at this time.     Past Medical History:  Past Medical History:  Diagnosis Date   Abnormal uterine bleeding    Abnormal vaginal bleeding    Allergic genetic state    Anemia    Breast cancer (HCC) 01/26/2015   Oncoltype score: 3. T1a,No  (isolated tumor cells_right breast lumpectomy with rad tx, ER/PR pos, her2 negative, INVASIVE MAMMARY CARCINOMA and DCIS of right breast   Carpal tunnel syndrome on both sides    Cysts of both ovaries    Depression    Edema of both legs    Fibroids    GERD (gastroesophageal reflux disease)    Hidradenitis    High triglycerides    Hypertension    RECENTLY TAKEN OFF OF MEDS PER MOM    Iron deficiency anemia    Learning disability    Migraine    daily   Mild intellectual disabilities    Personal history of radiation therapy    Pre-diabetes    Schizophrenia (HCC)    Seizures (HCC)    AS A CHILD-NONE SINCE    Past Surgical History:  Procedure Laterality Date   ABDOMINAL HYSTERECTOMY     AXILLARY HIDRADENITIS EXCISION     unc   AXILLARY SENTINEL NODE BIOPSY Right 02/23/2015   Sentinel lymph node biopsy x4, one with isolated tumor cells.;  Dr Evette Cristal, MD;  [facility];  ISOLATED TUMOR CELLS IN ONE LYMPH NODE (1/1).  BREAST BIOPSY Right 01/15/2016   stereo Dr Lemar Livings, fat necrosis   BREAST DUCTAL SYSTEM EXCISION Bilateral 01/26/2015   Bilateral retroareolar ductal excision for bloody nipple drainage, DCIS identified on the right   BREAST LUMPECTOMY Right 02/06/2015   Invasive mammary carcinoma.  5 mm, DCIS at margin.  Surgeon: Kieth Brightly, MD;  Location: [facility] ORS;  Service: General;  Laterality: Right;   CARPAL TUNNEL RELEASE     CARPAL TUNNEL RELEASE Right 05/08/2016   Procedure: OPEN CARPAL TUNNEL RELEASE;  Surgeon: Christena Flake, MD;  Location: [facility];  Service: Orthopedics;  Laterality: Right;   COLONOSCOPY WITH PROPOFOL N/A 06/17/2023   Procedure: COLONOSCOPY WITH PROPOFOL;  Surgeon: Regis Bill, MD;  Location: [facility];  Service: Endoscopy;  Laterality: N/A;   CYSTOSCOPY N/A 11/24/2017   Procedure: CYSTOSCOPY;  Surgeon: Christeen Douglas, MD;  Location: [facility];  Service: Gynecology;  Laterality: N/A;   CYSTOSCOPY     EXCISION / BIOPSY BREAST  / NIPPLE / DUCT Bilateral 01/26/2015   left breast duct ecstasis. right breast DCIS   INGUINAL HIDRADENITIS EXCISION     LAPAROSCOPIC BILATERAL SALPINGO OOPHERECTOMY Bilateral 11/24/2017   Procedure: LAPAROSCOPIC BILATERAL SALPINGO OOPHORECTOMY;  Surgeon: Christeen Douglas, MD;  Location: [facility];  Service: Gynecology;  Laterality: Bilateral;   LAPAROSCOPIC HYSTERECTOMY N/A 11/24/2017   Procedure: HYSTERECTOMY TOTAL LAPAROSCOPIC;  Surgeon: Christeen Douglas, MD;  Location: [facility];  Service: Gynecology;  Laterality: N/A;    Family Psychiatric History: Please see initial evaluation for full details. I have reviewed the history. No updates at this time.     Family History:  Family History  Problem Relation Age of Onset   Breast cancer Sister 44   Testicular cancer Father     Social History:  Social History   Socioeconomic History   Marital status: Single    Spouse name: Not on file   Number of children: Not on file   Years of education: Not on file   Highest education level: Not on file  Occupational History   Not on file  Tobacco Use   Smoking status: Never   Smokeless tobacco: Never  Vaping Use   Vaping status: Never Used  Substance and Sexual Activity   Alcohol use: No    Alcohol/week: 0.0 standard drinks of alcohol   Drug use: No   Sexual activity: Not on file  Other Topics Concern   Not on file  Social History Narrative   Not on file   Social Drivers of Health   Financial Resource Strain: Patient Declined (09/11/2022)   Received from [facility], [facility]   Overall Financial Resource Strain (CARDIA)    Difficulty of Paying Living Expenses: Patient declined  Food Insecurity: Patient Declined (09/11/2022)   Received from [facility], [facility]   Hunger Vital Sign    Worried About Running Out of Food in the Last Year: Patient declined    Ran Out of Food in the Last Year: Patient declined   Transportation Needs: Patient Declined (09/11/2022)   Received from [facility], [facility]   PRAPARE - Transportation    In the past 12 months, has lack of transportation kept you from medical appointments or from getting medications?: Patient declined    Lack of Transportation (Non-Medical): Patient declined  Physical Activity: Not on file  Stress: Not on file  Social Connections: Not on file    Allergies:  Allergies  Allergen Reactions   Sumatriptan Shortness Of Breath   Cephalexin Nausea And Vomiting   Chocolate Swelling   Corn Oil Other (See Comments)    By test. Pt has eaten without issues.   Peanut Oil Swelling and Other (See Comments)    throat   Peanut-Containing Drug Products Swelling   Phenobarbital Other (See Comments)    Hyperactivity, aggitation   Vilazodone Hcl Nausea Only   Duloxetine Hcl Anxiety and Other (See Comments)    agitation   Latex Rash and Other (See Comments)    Bandaids, gloves(when worn)   Sulfa Antibiotics Rash   Tape Rash    Metabolic Disorder Labs: Lab Results  Component Value Date   HGBA1C 5.1 06/12/2018   MPG 99.67 06/12/2018   MPG 100 03/13/2016   No results found for: "PROLACTIN" Lab Results  Component Value Date   CHOL 175 06/12/2018   TRIG 270 (H) 06/12/2018   HDL 31 (L) 06/12/2018   CHOLHDL 5.6 06/12/2018   VLDL 54 (H) 06/12/2018   LDLCALC 90 06/12/2018   LDLCALC 109 (H) 03/13/2016   Lab Results  Component Value Date   TSH 1.721 06/12/2018   TSH 1.864 03/13/2016    Therapeutic Level Labs: No results found for: "LITHIUM" No results found for: "VALPROATE" No results found for: "CBMZ"  Current Medications: Current Outpatient Medications  Medication Sig Dispense Refill   albuterol (VENTOLIN HFA) 108 (90 Base) MCG/ACT inhaler Inhale 2 puffs into the lungs every 6 (six) hours as needed.     ARIPiprazole (ABILIFY) 15 MG tablet Take 1 tablet (15 mg total) by mouth at bedtime. 30  tablet 3   aspirin-acetaminophen-caffeine (EXCEDRIN MIGRAINE) 250-250-65 MG per tablet Take 1 tablet by mouth every 6 (six) hours as needed for headache.      cetirizine (ZYRTEC) 10 MG tablet Take 10 mg by mouth daily as needed for allergies.      ergocalciferol (VITAMIN D2) 1.25 MG (50000 UT) capsule Take 50,000 Units by mouth once a week.     hydrochlorothiazide (MICROZIDE) 12.5 MG capsule TAKE 1 CAPSULE BY MOUTH ONCE DAILY. **TAKE WITH LOSARTAN 50 MG TABLET**     Icosapent Ethyl (VASCEPA) 1 g CAPS Take 2 g by mouth daily with lunch.     losartan (COZAAR) 50 MG tablet Take 1 tablet by mouth daily.     metFORMIN (GLUCOPHAGE) 500 MG tablet Take 1 tablet (500 mg total) by mouth daily. 30 tablet 3   rizatriptan (MAXALT-MLT) 10 MG disintegrating tablet      sertraline (ZOLOFT) 25 MG tablet Take 1 tablet (25 mg total) by mouth at bedtime. 30 tablet 1   omeprazole (PRILOSEC) 20 MG capsule Take 1 capsule by mouth daily.     zonisamide (ZONEGRAN) 100 MG capsule Take 3 capsules by mouth daily.     No current facility-administered medications for this visit.     Musculoskeletal: Strength & Muscle Tone:  normal Gait & Station: normal Patient leans: N/A  Psychiatric Specialty Exam: Review of Systems  Psychiatric/Behavioral:  Positive for dysphoric mood and sleep disturbance. Negative for agitation, behavioral problems, confusion, decreased concentration, hallucinations, self-injury and suicidal ideas. The patient is not nervous/anxious and is not hyperactive.   All other systems reviewed and are negative.   Blood pressure (!) 138/90, pulse 94, height 5' 2.6" (1.59 m), weight 197 lb (89.4 kg), last menstrual period 11/10/2017.Body mass index is 35.34 kg/m.  General Appearance: Well Groomed  Eye Contact:  Good  Speech:  Clear and Coherent  Volume:  Normal  Mood:   good  Affect:  Appropriate, Congruent, and Full Range  Thought Process:  Coherent  Orientation:  Full (Time, Place, and Person)   Thought Content: Logical   Suicidal Thoughts:  No  Homicidal Thoughts:  No  Memory:  Immediate;   Good  Judgement:  Good  Insight:  Good  Psychomotor Activity:  Normal, Normal tone, no rigidity, no resting/postural tremors, no tardive dyskinesia    Concentration:  Concentration: Good and Attention Span: Good  Recall:  Good  Fund of Knowledge: Good  Language: Good  Akathisia:  No  Handed:  Right  AIMS (if indicated): 0   Assets:  Communication Skills Desire for Improvement  ADL's:  Intact  Cognition: WNL  Sleep:  Fair   Screenings: AUDIT    Flowsheet Row Admission (Discharged) from 06/12/2018 in [facility] Admission (Discharged) from 03/12/2016 in [facility]  Alcohol Use Disorder Identification Test Final Score (AUDIT) 0 0      GAD-7    Flowsheet Row Office Visit from 05/12/2023 in [facility] Office Visit from 06/25/2022 in [facility] Office Visit from 04/29/2022 in [facility] Office Visit from 01/07/2022 in [facility]  Total GAD-7 Score 7 4 9 3       PHQ2-9    Flowsheet Row Office Visit from 05/12/2023 in [facility] Office Visit from 06/25/2022 in [facility] Office Visit from 04/29/2022 in [facility] Office Visit from 01/07/2022 in [facility] Office Visit from 12/06/2021 in [facility]  PHQ-2 Total Score 1 2 1 2 4   PHQ-9 Total Score -- 4 6 2 9         Assessment and Plan:  Teira Marcoe is a 53 y.o. year old female with a history of undifferentiated psychotic symptoms, mild intellectual disability, history of seizure disorder in childhood,  adenocarcinoma of the right breast on tamoxifen s/p radiation, hypertension, who presents for follow up appointment for below.   1. Undifferentiated schizophrenia (HCC) 2. Intellectual developmental disorder, mild 3. Drug-induced weight gain History: transferred from [facility]. history of AH, paranoia. Admitted twice for psychosis, last in 2019.  No aggression in the past.previously on Abilify maintenance 300, clonazepam 0.5, benadryl 50 daily, haldol 10 mg daily.   Stable.  She denies any psychotic symptoms since restarting Abilify.  Will continue current dose to target schizophrenia, along with metformin for weight gain associated with antipsychotic use.  Noted that she has not had weight gain this year, although she was successful in weight loss last year while being on Abilify.  She is willing to see a nutritionist. Will follow up on this at her next visit.   4. Recurrent major depressive disorder, in partial remission (HCC) Acute stressors include: her mother s/p stroke, loss of her dog  Other stressors include: losses including her uncle     Although she reports sadness in relation to the grief of loss of her dog, she has been engaging in more activities as her mother's condition improved.  Will continue current dose of sertraline to target depression and anxiety.  Noted that although she will greatly benefit from CBT, she is not interested in this.   # fatigue.  - completed HST Unchanged.  She has an upcoming appointment with her sleep specialist.  Plan Continue Abilify 15 mg at night - EKG. HR 73, QTc 425 msec 07/2022  Continue sertraline 25 mg at night  Continue metformin 500 mg in pm - referred to nutritionist Next appointment: 3/18 at 8:30, IP (Lipid, 08/2022)   The patient demonstrates the following risk factors for suicide: Chronic risk factors for suicide include: psychiatric disorder of schizophrenia . Acute risk factors for suicide include: N/A. Protective factors for this  patient include: positive social support. Considering these factors, the overall suicide risk at this point appears to be low. Patient is appropriate for outpatient follow up.  Collaboration of Care: Collaboration of Care: Other reviewed notes in Epic  Patient/Guardian was advised Release of Information must be obtained prior to any record release in order to collaborate their care with an outside provider. Patient/Guardian was advised if they have not already done so to contact the registration department to sign all necessary forms in order for [place] to release information regarding their care.   Consent: Patient/Guardian gives verbal consent for treatment and assignment of benefits for services provided during this visit. Patient/Guardian expressed understanding and agreed to proceed.    Neysa Hotter, MD 07/07/2023, 9:03 AM

## 2023-07-07 ENCOUNTER — Encounter: Payer: Self-pay | Admitting: Psychiatry

## 2023-07-07 ENCOUNTER — Ambulatory Visit (INDEPENDENT_AMBULATORY_CARE_PROVIDER_SITE_OTHER): Payer: Medicare Other | Admitting: Psychiatry

## 2023-07-07 VITALS — BP 138/90 | HR 94 | Ht 62.6 in | Wt 197.0 lb

## 2023-07-07 DIAGNOSIS — F70 Mild intellectual disabilities: Secondary | ICD-10-CM

## 2023-07-07 DIAGNOSIS — T50905A Adverse effect of unspecified drugs, medicaments and biological substances, initial encounter: Secondary | ICD-10-CM

## 2023-07-07 DIAGNOSIS — F3341 Major depressive disorder, recurrent, in partial remission: Secondary | ICD-10-CM | POA: Diagnosis not present

## 2023-07-07 DIAGNOSIS — R635 Abnormal weight gain: Secondary | ICD-10-CM

## 2023-07-07 DIAGNOSIS — F203 Undifferentiated schizophrenia: Secondary | ICD-10-CM | POA: Diagnosis not present

## 2023-07-07 NOTE — Patient Instructions (Signed)
Continue Abilify 15 mg at night  Continue sertraline 25 mg at night  Continue metformin 500 mg in pm Next appointment: 3/18 at 8:30

## 2023-07-10 ENCOUNTER — Encounter: Payer: Self-pay | Admitting: Oncology

## 2023-07-10 ENCOUNTER — Other Ambulatory Visit: Payer: Self-pay | Admitting: Family Medicine

## 2023-07-10 DIAGNOSIS — Z1231 Encounter for screening mammogram for malignant neoplasm of breast: Secondary | ICD-10-CM

## 2023-07-19 ENCOUNTER — Encounter: Payer: Self-pay | Admitting: Oncology

## 2023-07-25 ENCOUNTER — Encounter: Payer: Self-pay | Admitting: Nurse Practitioner

## 2023-07-25 ENCOUNTER — Ambulatory Visit (INDEPENDENT_AMBULATORY_CARE_PROVIDER_SITE_OTHER): Payer: Medicare Other | Admitting: Nurse Practitioner

## 2023-07-25 VITALS — BP 110/88 | HR 97 | Temp 97.6°F | Ht 62.5 in | Wt 197.0 lb

## 2023-07-25 DIAGNOSIS — G4719 Other hypersomnia: Secondary | ICD-10-CM | POA: Diagnosis not present

## 2023-07-25 DIAGNOSIS — E66812 Obesity, class 2: Secondary | ICD-10-CM | POA: Diagnosis not present

## 2023-07-25 DIAGNOSIS — G4733 Obstructive sleep apnea (adult) (pediatric): Secondary | ICD-10-CM

## 2023-07-25 NOTE — Assessment & Plan Note (Signed)
 Healthy weight loss encouraged

## 2023-07-25 NOTE — Assessment & Plan Note (Addendum)
 Mildly moderate OSA without significant oxygen desaturations, SpO2 low 90%. Apnea index 9/h. We discussed how untreated sleep apnea puts an individual at risk for cardiac arrhthymias, pulm HTN, DM, stroke and increases their risk for daytime accidents. We also briefly reviewed treatment options including weight loss, side sleeping position, oral appliance, CPAP therapy or referral to ENT for possible surgical options. She declined oral appliance and CPAP. Would prefer to utilize positional sleeping and focus on weight loss. Encouraged to use caution when driving and reviewed safe driving practices. She will notify if symptoms worsen or fail to improve.  Suspect a large component of her daytime fatigue is related to her psychiatric medications - will continue to work with psych. Sleep hygiene reviewed.   Patient Instructions  We discussed how untreated sleep apnea puts an individual at risk for cardiac arrhthymias, pulm HTN, DM, stroke and increases their risk for daytime accidents. However, your sleep study showed mildly moderate sleep apnea without any significant oxygen drops. This has minimal cardiovascular risks.  We also briefly reviewed treatment options including weight loss, side sleeping position, oral appliance, CPAP therapy   You have opted to utilize a side lying sleeping position and work on weight loss   If symptoms don't get better or get worse, call me and we can consider alternative therapies   Follow up in 1 year or sooner with Izetta Lonzell Dorris,NP

## 2023-07-25 NOTE — Assessment & Plan Note (Signed)
 See above

## 2023-07-25 NOTE — Progress Notes (Signed)
 @Patient  ID: Karen Beard, female    DOB: May 25, 1971, 53 y.o.   MRN: [id number]  Chief Complaint  Patient presents with   Follow-up    Home sleep study completed on 06/15/2023.    Referring provider: Valora Agent, MD  HPI: 53 year old female, never smoker referred for sleep consult 05/09/2023.  Past medical history significant for hypertension, migraines, depression, IDA, schizophrenia, mild intellectual disability, history of stage Ia right breast cancer.  TEST/EVENTS:  06/15/2023 HST: AHI 15.6/h, SpO2 low 90%  05/09/2023: OV with Karen Beard Karen Beard The patient presents today with her sister, who helps to provide history, for sleep consult. She was referred by her psychiatrist for a sleep study due to excessive daytime sleepiness. She reports feeling tired at times, but also attributes the excessive sleep to a lack of daytime activities and boredom. The patient is currently caring for her mother, which may contribute to her fatigue. The patient is unsure about snoring but denies waking up gasping for air or choking. Upon waking, she expresses a desire to return to sleep. She reports daily morning headaches. The patient denies sleepwalking or episodes of sleep paralysis. No history of narcolepsy or symptoms of cataplexy. No drowsy driving.  Goes to bed between 9 to 10 PM.  Can take her a few hours to fall asleep some nights.  Other nights she falls asleep quickly.  Wakes 3 times a night.  Usually gets up around 9 AM.  Does not currently work.  Her weight is up 20 pounds over the last 2 years.  She never had previously study.  Does not use CPAP.  No supplemental oxygen use. She has a history of hypertension, controlled on antihypertensives.  No history of stroke or diabetes.  She does have a history of depression and schizophrenia.  She is on Abilify .  She follows with a psychiatrist. She is a never smoker.  Does not drink any alcohol.  No signs of caffeine  intake.  Lives with her mother.  No  reported family history. [place] 4  07/25/2023: Today - follow up Patient presents today for follow up with her sister. She had a home sleep study, which revealed mildly moderate sleep apnea without significant oxygen desaturations. She feels unchanged compared to our last visit. She is tired at times but they think most of her symptoms are related to lack of daytime activities and boredom. No drowsy driving. No sleep parasomnias/paralysis.   Allergies  Allergen Reactions   Sumatriptan Shortness Of Breath   Cephalexin Nausea And Vomiting   Chocolate Swelling   Corn Oil Other (See Comments)    By test. Pt has eaten without issues.   Peanut Oil Swelling and Other (See Comments)    throat   Peanut-Containing Drug Products Swelling   Phenobarbital Other (See Comments)    Hyperactivity, aggitation   Vilazodone Hcl Nausea Only   Duloxetine Hcl Anxiety and Other (See Comments)    agitation   Latex Rash and Other (See Comments)    Bandaids, gloves(when worn)   Sulfa Antibiotics Rash   Tape Rash    Immunization History  Administered Date(s) Administered   Influenza,inj,Quad PF,6+ Mos 03/11/2017, 03/01/2019   Influenza-Unspecified 02/22/2012, 05/07/2013, 03/28/2014, 04/07/2015, 03/07/2016, 03/11/2017, 02/16/2023   Unspecified SARS-COV-2 Vaccination 08/26/2019    Past Medical History:  Diagnosis Date   Abnormal uterine bleeding    Abnormal vaginal bleeding    Allergic genetic state    Anemia    Breast cancer (HCC) 01/26/2015   Oncoltype score: 3. T1a,No (  isolated tumor cells_right breast lumpectomy with rad tx, ER/PR pos, her2 negative, INVASIVE MAMMARY CARCINOMA and DCIS of right breast   Carpal tunnel syndrome on both sides    Cysts of both ovaries    Depression    Edema of both legs    Fibroids    GERD (gastroesophageal reflux disease)    Hidradenitis    High triglycerides    Hypertension    RECENTLY TAKEN OFF OF MEDS PER MOM    Iron deficiency anemia    Learning disability     Migraine    daily   Mild intellectual disabilities    Personal history of radiation therapy    Pre-diabetes    Schizophrenia (HCC)    Seizures (HCC)    AS A CHILD-NONE SINCE    Tobacco History: Social History   Tobacco Use  Smoking Status Never  Smokeless Tobacco Never   Counseling given: Not Answered   Outpatient Medications Prior to Visit  Medication Sig Dispense Refill   ARIPiprazole  (ABILIFY ) 15 MG tablet Take 1 tablet (15 mg total) by mouth at bedtime. 30 tablet 3   aspirin -acetaminophen -caffeine  (EXCEDRIN  MIGRAINE) 250-250-65 MG per tablet Take 1 tablet by mouth every 6 (six) hours as needed for headache.      cetirizine (ZYRTEC) 10 MG tablet Take 10 mg by mouth daily as needed for allergies.      ergocalciferol (VITAMIN D2) 1.25 MG (50000 UT) capsule Take 50,000 Units by mouth once a week.     hydrochlorothiazide  (MICROZIDE ) 12.5 MG capsule TAKE 1 CAPSULE BY MOUTH ONCE DAILY. **TAKE WITH LOSARTAN  50 MG TABLET**     losartan  (COZAAR ) 50 MG tablet Take 1 tablet by mouth daily.     metFORMIN  (GLUCOPHAGE ) 500 MG tablet Take 1 tablet (500 mg total) by mouth daily. 30 tablet 3   omeprazole (PRILOSEC) 20 MG capsule Take 1 capsule by mouth daily.     sertraline  (ZOLOFT ) 25 MG tablet Take 1 tablet (25 mg total) by mouth at bedtime. 30 tablet 1   albuterol (VENTOLIN HFA) 108 (90 Base) MCG/ACT inhaler Inhale 2 puffs into the lungs every 6 (six) hours as needed. (Patient not taking: Reported on 07/25/2023)     Icosapent Ethyl (VASCEPA) 1 g CAPS Take 2 g by mouth daily with lunch. (Patient not taking: Reported on 07/25/2023)     rizatriptan (MAXALT-MLT) 10 MG disintegrating tablet  (Patient not taking: Reported on 07/25/2023)     zonisamide (ZONEGRAN) 100 MG capsule Take 3 capsules by mouth daily.     No facility-administered medications prior to visit.     Review of Systems:   Constitutional: No night sweats, fevers, chills, or lassitude. +weight gain, fatigue HEENT: No difficulty  swallowing, tooth/dental problems, or sore throat. No sneezing, itching, ear ache, nasal congestion, or post nasal drip. +headaches CV:  No chest pain, orthopnea, PND, swelling in lower extremities, anasarca, dizziness, palpitations, syncope Resp: No shortness of breath with exertion or at rest. No excess mucus or change in color of mucus. No productive or non-productive. No hemoptysis. No wheezing.  No chest wall deformity GI:  +heartburn, indigestion (controlled with PPI). No abdominal pain, nausea, vomiting, diarrhea, change in bowel habits, loss of appetite, bloody stools.  GU: No nocturia Skin: No rash, lesions, ulcerations MSK:  No joint pain or swelling.  Neuro: No dizziness or lightheadedness.  Psych: +stable depression. No anxiety. No SI/HI. Mood stable. +sleep disturbance     Physical Exam:  BP 110/88 (BP Location: Left Arm, Patient  Position: Sitting, Cuff Size: Normal)   Pulse 97   Temp 97.6 F (36.4 C) (Temporal)   Ht 5' 2.5 (1.588 m)   Wt 197 lb (89.4 kg)   LMP 11/10/2017   SpO2 99%   BMI 35.46 kg/m   GEN: Pleasant, well-kempt; obese; in no acute distress HEENT:  Normocephalic and atraumatic. PERRLA. Sclera white. Nasal turbinates pink, moist and patent bilaterally. No rhinorrhea present. Oropharynx pink and moist, without exudate or edema. No lesions, ulcerations, or postnasal drip. Mallampati III NECK:  Supple w/ fair ROM. No JVD present. Normal carotid impulses w/o bruits. Thyroid  symmetrical with no goiter or nodules palpated. No lymphadenopathy.   CV: RRR, no m/r/g, no peripheral edema. Pulses intact, +2 bilaterally. No cyanosis, pallor or clubbing. PULMONARY:  Unlabored, regular breathing. Clear bilaterally A&P w/o wheezes/rales/rhonchi. No accessory muscle use.  GI: BS present and normoactive. Soft, non-tender to palpation. No organomegaly or masses detected.  MSK: No erythema, warmth or tenderness. Cap refil <2 sec all extrem. No deformities or joint swelling  noted.  Neuro: A/Ox3. No focal deficits noted.   Skin: Warm, no lesions or rashe Psych: Flat affect. Normal behavior. Judgement and thought content appropriate.     Lab Results:  CBC    Component Value Date/Time   WBC 6.1 06/11/2018 1531   RBC 5.14 (H) 06/11/2018 1531   HGB 13.2 06/11/2018 1531   HGB 8.5 (L) 02/14/2015 1516   HCT 40.9 06/11/2018 1531   HCT 28.4 (L) 02/14/2015 1516   PLT 279 06/11/2018 1531   PLT 383 (H) 02/14/2015 1516   MCV 79.6 (L) 06/11/2018 1531   MCV 68 (L) 02/14/2015 1516   MCH 25.7 (L) 06/11/2018 1531   MCHC 32.3 06/11/2018 1531   RDW 13.4 06/11/2018 1531   RDW 16.2 (H) 02/14/2015 1516   LYMPHSABS 1.3 06/11/2018 1531   LYMPHSABS 1.4 02/14/2015 1516   MONOABS 0.4 06/11/2018 1531   EOSABS 0.5 06/11/2018 1531   EOSABS 1.6 (H) 02/14/2015 1516   BASOSABS 0.0 06/11/2018 1531   BASOSABS 0.1 02/14/2015 1516    BMET    Component Value Date/Time   NA 138 06/11/2018 1531   NA 142 02/14/2015 1516   K 2.8 (L) 06/11/2018 1531   CL 102 06/11/2018 1531   CO2 29 06/11/2018 1531   GLUCOSE 108 (H) 06/11/2018 1531   BUN 14 06/11/2018 1531   BUN 13 02/14/2015 1516   CREATININE 0.90 05/24/2019 1439   CALCIUM 8.7 (L) 06/11/2018 1531   GFRNONAA >60 06/11/2018 1531   GFRAA >60 06/11/2018 1531    BNP    Component Value Date/Time   BNP 11.0 04/01/2018 1605     Imaging:  No results found.  Administration History     None           No data to display          No results found for: NITRICOXIDE      Assessment & Plan:   OSA (obstructive sleep apnea) Mildly moderate OSA without significant oxygen desaturations, SpO2 low 90%. Apnea index 9/h. We discussed how untreated sleep apnea puts an individual at risk for cardiac arrhthymias, pulm HTN, DM, stroke and increases their risk for daytime accidents. We also briefly reviewed treatment options including weight loss, side sleeping position, oral appliance, CPAP therapy or referral to ENT for  possible surgical options. She declined oral appliance and CPAP. Would prefer to utilize positional sleeping and focus on weight loss. Encouraged to use caution when driving and  reviewed safe driving practices. She will notify if symptoms worsen or fail to improve.  Suspect a large component of her daytime fatigue is related to her psychiatric medications - will continue to work with psych. Sleep hygiene reviewed.   Patient Instructions  We discussed how untreated sleep apnea puts an individual at risk for cardiac arrhthymias, pulm HTN, DM, stroke and increases their risk for daytime accidents. However, your sleep study showed mildly moderate sleep apnea without any significant oxygen drops. This has minimal cardiovascular risks.  We also briefly reviewed treatment options including weight loss, side sleeping position, oral appliance, CPAP therapy   You have opted to utilize a side lying sleeping position and work on weight loss   If symptoms don't get better or get worse, call me and we can consider alternative therapies   Follow up in 1 year or sooner with Karen Blen Ransome,Karen Beard     Excessive daytime sleepiness See above  Obesity, Class II, BMI 35-39.9 Healthy weight loss encouraged    Advised if symptoms do not improve or worsen, to please contact office for sooner follow up or seek emergency care.   I spent 25 minutes of dedicated to the care of this patient on the date of this encounter to include pre-visit review of records, face-to-face time with the patient discussing conditions above, post visit ordering of testing, clinical documentation with the electronic health record, making appropriate referrals as documented, and communicating necessary findings to members of the patients care team.  Karen LULLA Rouleau, Karen Beard 07/25/2023  Pt aware and understands Karen Beard's role.

## 2023-07-25 NOTE — Patient Instructions (Signed)
 We discussed how untreated sleep apnea puts an individual at risk for cardiac arrhthymias, pulm HTN, DM, stroke and increases their risk for daytime accidents. However, your sleep study showed mildly moderate sleep apnea without any significant oxygen drops. This has minimal cardiovascular risks.  We also briefly reviewed treatment options including weight loss, side sleeping position, oral appliance, CPAP therapy   You have opted to utilize a side lying sleeping position and work on weight loss   If symptoms don't get better or get worse, call me and we can consider alternative therapies   Follow up in 1 year or sooner with Karen Ritisha Deitrick,NP

## 2023-07-29 ENCOUNTER — Encounter: Payer: Self-pay | Admitting: Internal Medicine

## 2023-07-30 ENCOUNTER — Encounter: Payer: Self-pay | Admitting: Internal Medicine

## 2023-08-06 ENCOUNTER — Ambulatory Visit: Admission: RE | Admit: 2023-08-06 | Payer: Medicare Other | Source: Home / Self Care | Admitting: Internal Medicine

## 2023-08-06 SURGERY — COLONOSCOPY WITH PROPOFOL
Anesthesia: General

## 2023-08-07 ENCOUNTER — Other Ambulatory Visit: Payer: Self-pay | Admitting: Psychiatry

## 2023-08-12 ENCOUNTER — Ambulatory Visit
Admission: RE | Admit: 2023-08-12 | Discharge: 2023-08-12 | Disposition: A | Discharge status: Home / Self Care | Payer: Medicare Other | Source: Ambulatory Visit | Attending: Family Medicine | Admitting: Family Medicine

## 2023-08-12 DIAGNOSIS — Z1231 Encounter for screening mammogram for malignant neoplasm of breast: Secondary | ICD-10-CM | POA: Insufficient documentation

## 2023-08-18 ENCOUNTER — Telehealth: Payer: Self-pay

## 2023-08-18 DIAGNOSIS — F203 Undifferentiated schizophrenia: Secondary | ICD-10-CM

## 2023-08-18 MED ORDER — SERTRALINE HCL 25 MG PO TABS: 25.0000 mg | ORAL_TABLET | Freq: Every day | ORAL | 0 refills | Status: DC

## 2023-08-18 NOTE — Telephone Encounter (Signed)
 pt needs refill on the sertraline. pt was last seen on 1-20 next appt 3-18

## 2023-08-18 NOTE — Telephone Encounter (Signed)
I have sent sertraline to pharmacy. 

## 2023-08-19 NOTE — Telephone Encounter (Signed)
 left message that rx had been sent to the pharmacy

## 2023-08-25 ENCOUNTER — Encounter: Payer: Self-pay | Admitting: Oncology

## 2023-08-26 ENCOUNTER — Encounter: Payer: Self-pay | Admitting: Oncology

## 2023-08-31 NOTE — Progress Notes (Unsigned)
 [facility] MD/PA/NP OP Progress Note  09/02/2023 9:12 AM Karen Beard  MRN:  [id number]  Chief Complaint:  Chief Complaint  Patient presents with   Follow-up   HPI:  This is a follow-up appointment for schizophrenia, depression, insomnia.  She states that she lost her dog in January. She was 53 yo.  She feels depressed due to this.  She still goes to the grocery store or dollar tree, or spending time with her mother.  She has initial and middle insomnia.  She thinks somebody is planning basketball outside in the middle of the night (michelle does not think they play in the middle of the night).  Although she denies binge eating, she tends to get hungry frequently.  She feels anxious especially when she drives.  She occasionally feels anxious about her mother.  She denies SI, HI, paranoia.   Karen Beard, her sister presents to the visit.  She shared at length regarding how Karen Beard has been doing since the last visit.  Karen Beard reports her significant frustration that Karen Beard is doing "nothing active".  She will not go outside, no exercise, no stimulation.  [name] canceled colonoscopy on the day of the appointment as she was feeling sick. Karen Beard would like Karen Beard to be independent as Karen Beard brings her to the appointments.  She agrees that therapy is a way for her to become independent, and be more active.    Wt Readings from Last 3 Encounters:  09/02/23 203 lb 9.6 oz (92.4 kg)  07/25/23 197 lb (89.4 kg)  07/07/23 197 lb (89.4 kg)      89.8 kg (198 lb) 09/11/2022 10:45 AM ED         Wt Readings from Last 3 Encounters:  06/25/22 198 lb 3.2 oz (89.9 kg)  04/29/22 198 lb 6.4 oz (90 kg)  01/07/22 172 lb 12.8 oz (78.4 kg)    12/06/21 175 lb 9.6 oz (79.7 kg)  02/29/20 192 lb 8 oz (87.3 kg)  08/20/19 207 lb 12.8 oz (94.3 kg)   Visit Diagnosis:    ICD-10-CM   1. 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 Intellectual developmental disorder, mild  F70     3. Drug-induced  weight gain  R63.5    T50.905A     4. Recurrent major depressive disorder, in partial remission (HCC)  F33.41       Past Psychiatric History: Please see initial evaluation for full details. I have reviewed the history. No updates at this time.     Past Medical History:  Past Medical History:  Diagnosis Date   Abnormal uterine bleeding    Abnormal vaginal bleeding    Allergic genetic state    Anemia    Breast cancer (HCC) 01/26/2015   Oncoltype score: 3. T1a,No (isolated tumor cells_right breast lumpectomy with rad tx, ER/PR pos, her2 negative, INVASIVE MAMMARY CARCINOMA and DCIS of right breast   Carpal tunnel syndrome on both sides    Cysts of both ovaries    Depression    Edema of both legs    Fibroids    GERD (gastroesophageal reflux disease)    Hidradenitis    High triglycerides    Hypertension    RECENTLY TAKEN OFF OF MEDS PER MOM    Iron deficiency anemia    Learning disability    Migraine    daily   Mild intellectual disabilities    Personal history of radiation therapy    Pre-diabetes    Schizophrenia (HCC)  Seizures (HCC)    AS A CHILD-NONE SINCE    Past Surgical History:  Procedure Laterality Date   ABDOMINAL HYSTERECTOMY     AXILLARY HIDRADENITIS EXCISION     unc   AXILLARY SENTINEL NODE BIOPSY Right 02/23/2015   Sentinel lymph node biopsy x4, one with isolated tumor cells.;  Dr Evette Cristal, MD;  [facility];  ISOLATED TUMOR CELLS IN ONE LYMPH NODE (1/1).   BREAST BIOPSY Right 01/15/2016   stereo Dr Lemar Livings, fat necrosis   BREAST DUCTAL SYSTEM EXCISION Bilateral 01/26/2015   Bilateral retroareolar ductal excision for bloody nipple drainage, DCIS identified on the right   BREAST LUMPECTOMY Right 02/06/2015   Invasive mammary carcinoma.  5 mm, DCIS at margin.  Surgeon: Kieth Brightly, MD;  Location: [facility] ORS;  Service: General;  Laterality: Right;   CARPAL TUNNEL RELEASE Right 05/08/2016   Procedure: OPEN CARPAL TUNNEL RELEASE;  Surgeon: Christena Flake,  MD;  Location: [facility];  Service: Orthopedics;  Laterality: Right;   COLONOSCOPY WITH PROPOFOL N/A 06/17/2023   Procedure: COLONOSCOPY WITH PROPOFOL;  Surgeon: Regis Bill, MD;  Location: [facility];  Service: Endoscopy;  Laterality: N/A;   CYSTOSCOPY N/A 11/24/2017   Procedure: CYSTOSCOPY;  Surgeon: Christeen Douglas, MD;  Location: [facility];  Service: Gynecology;  Laterality: N/A;   EXCISION / BIOPSY BREAST / NIPPLE / DUCT Bilateral 01/26/2015   left breast duct ecstasis. right breast DCIS   INGUINAL HIDRADENITIS EXCISION     LAPAROSCOPIC BILATERAL SALPINGO OOPHERECTOMY Bilateral 11/24/2017   Procedure: LAPAROSCOPIC BILATERAL SALPINGO OOPHORECTOMY;  Surgeon: Christeen Douglas, MD;  Location: [facility] OR[facility];  Service: Gynecology;  Laterality: Bilateral;   LAPAROSCOPIC HYSTERECTOMY N/A 11/24/2017   Procedure: HYSTERECTOMY TOTAL LAPAROSCOPIC;  Surgeon: Christeen Douglas, MD;  Location: [facility];  Service: Gynecology;  Laterality: N/A;    Family Psychiatric History: Please see initial evaluation for full details. I have reviewed the history. No updates at this time.     Family History:  Family History  Problem Relation Age of Onset   Breast cancer Sister 36   Testicular cancer Father     Social History:  Social History   Socioeconomic History   Marital status: Single    Spouse name: Not on file   Number of children: Not on file   Years of education: Not on file   Highest education level: Not on file  Occupational History   Not on file  Tobacco Use   Smoking status: Never   Smokeless tobacco: Never  Vaping Use   Vaping status: Never Used  Substance and Sexual Activity   Alcohol use: No    Alcohol/week: 0.0 standard drinks of alcohol   Drug use: No   Sexual activity: Not on file  Other Topics Concern   Not on file  Social History Narrative   Not on file   Social Drivers of Health   Financial Resource Strain: Patient Declined (09/11/2022)   Received from  [facility], [facility]   Overall Financial Resource Strain (CARDIA)    Difficulty of Paying Living Expenses: Patient declined  Food Insecurity: Patient Declined (09/11/2022)   Received from [facility], [facility]   Hunger Vital Sign    Worried About Running Out of Food in the Last Year: Patient declined    Ran Out of Food in the Last Year: Patient declined  Transportation Needs: Patient Declined (09/11/2022)   Received from [facility], [facility]   PRAPARE - Transportation    In the past 12 months, has lack of transportation kept you from medical appointments or from getting medications?: Patient declined    Lack of Transportation (Non-Medical): Patient declined  Physical Activity: Not on file  Stress: Not on file  Social Connections: Not on file    Allergies:  Allergies  Allergen Reactions   Sumatriptan Shortness Of Breath   Cephalexin Nausea And Vomiting   Chocolate Swelling   Corn Oil Other (See Comments)    By test. Pt has eaten without issues.   Peanut Oil Swelling and Other (See Comments)    throat   Peanut-Containing Drug Products Swelling   Phenobarbital Other (See Comments)    Hyperactivity, aggitation   Vilazodone Hcl Nausea Only   Duloxetine Hcl Anxiety and Other (See Comments)    agitation   Latex Rash and Other (See Comments)    Bandaids, gloves(when worn)   Sulfa Antibiotics Rash   Tape Rash    Metabolic Disorder Labs: Lab Results  Component Value Date   HGBA1C 5.1 06/12/2018   MPG 99.67 06/12/2018   MPG 100 03/13/2016   No results found for: "PROLACTIN" Lab Results  Component Value Date   CHOL 175 06/12/2018   TRIG 270 (H) 06/12/2018   HDL 31 (L) 06/12/2018   CHOLHDL 5.6 06/12/2018   VLDL 54 (H) 06/12/2018   LDLCALC 90 06/12/2018   LDLCALC 109 (H) 03/13/2016   Lab Results  Component Value Date   TSH 1.721 06/12/2018   TSH 1.864  03/13/2016    Therapeutic Level Labs: No results found for: "LITHIUM" No results found for: "VALPROATE" No results found for: "CBMZ"  Current Medications: Current Outpatient Medications  Medication Sig Dispense Refill   albuterol (VENTOLIN HFA) 108 (90 Base) MCG/ACT inhaler Inhale 2 puffs into the lungs every 6 (six) hours as needed.     ARIPiprazole (ABILIFY) 15 MG tablet Take 1 tablet (15 mg total) by mouth at bedtime. 30 tablet 3   aspirin-acetaminophen-caffeine (EXCEDRIN MIGRAINE) 250-250-65 MG per tablet Take 1 tablet by mouth every 6 (six) hours as needed for headache.      cetirizine (ZYRTEC) 10 MG tablet Take 10 mg by mouth daily as needed for allergies.      ergocalciferol (VITAMIN D2) 1.25 MG (50000 UT) capsule Take 50,000 Units by mouth once a week.     hydrochlorothiazide (MICROZIDE) 12.5 MG capsule TAKE 1 CAPSULE BY MOUTH ONCE DAILY. **TAKE WITH LOSARTAN 50 MG TABLET**     Icosapent Ethyl (VASCEPA) 1 g CAPS Take 2 g by mouth daily with lunch.     losartan (COZAAR) 50 MG tablet Take 1 tablet by mouth daily.     metFORMIN (GLUCOPHAGE) 500 MG tablet Take 1 tablet (500 mg total) by mouth 2 (two) times daily with a meal. 60 tablet 1   rizatriptan (MAXALT-MLT) 10 MG disintegrating tablet      sertraline (ZOLOFT) 25 MG tablet Take 1 tablet (25 mg total) by mouth at bedtime. 30 tablet 0   omeprazole (PRILOSEC) 20 MG capsule Take 1 capsule by mouth daily.     zonisamide (ZONEGRAN) 100 MG capsule Take 3 capsules by mouth daily.     No current facility-administered medications for this visit.     Musculoskeletal: Strength & Muscle Tone: within normal limits Gait & Station: normal Patient leans: N/A  Psychiatric Specialty Exam: Review of Systems  Psychiatric/Behavioral:  Positive for dysphoric mood and sleep disturbance. Negative for agitation, behavioral problems, confusion,  decreased concentration, hallucinations, self-injury and suicidal ideas. The patient is nervous/anxious.  The patient is not hyperactive.   All other systems reviewed and are negative.   Blood pressure 124/86, pulse 91, temperature 98.7 F (37.1 C), temperature source Temporal, height 5' 2.5" (1.588 m), weight 203 lb 9.6 oz (92.4 kg), last menstrual period 11/10/2017, SpO2 99%.Body mass index is 36.65 kg/m.  General Appearance: Well Groomed  Eye Contact:  Good  Speech:  Clear and Coherent  Volume:  Normal  Mood:  Depressed  Affect:  Appropriate, Congruent, and down  Thought Process:  Coherent  Orientation:  Full (Time, Place, and Person)  Thought Content: Logical   Suicidal Thoughts:  No  Homicidal Thoughts:  No  Memory:  Immediate;   Good  Judgement:  Good  Insight:  Good  Psychomotor Activity:  Normal, Normal tone, no rigidity, no resting/postural tremors, no tardive dyskinesia    Concentration:  Concentration: Good and Attention Span: Good  Recall:  Good  Fund of Knowledge: Good  Language: Good  Akathisia:  No  Handed:  Right  AIMS (if indicated): 0   Assets:  Communication Skills Desire for Improvement  ADL's:  Intact  Cognition: WNL  Sleep:  Poor   Screenings: AUDIT    Flowsheet Row Admission (Discharged) from 06/12/2018 in [facility] Admission (Discharged) from 03/12/2016 in [facility]  Alcohol Use Disorder Identification Test Final Score (AUDIT) 0 0      GAD-7    Flowsheet Row Office Visit from 05/12/2023 in [facility] Office Visit from 06/25/2022 in [facility] Office Visit from 04/29/2022 in [facility] Office Visit from 01/07/2022 in [facility]  Total GAD-7 Score 7 4 9 3       PHQ2-9    Flowsheet Row Office Visit from 05/12/2023 in [facility] Office Visit from 06/25/2022 in [facility] Office Visit from 04/29/2022 in [facility] Office Visit from 01/07/2022 in [facility] Office Visit from 12/06/2021 in [facility]  PHQ-2 Total Score 1 2 1 2 4   PHQ-9 Total Score -- 4 6 2 9         Assessment and Plan:  Jaslynne Dahan is a 53 y.o. year old female with a history of undifferentiated psychotic symptoms, mild intellectual disability, history of seizure disorder in childhood, adenocarcinoma of the right breast on tamoxifen s/p radiation, hypertension, who presents for follow up appointment for below.   1. Undifferentiated schizophrenia (HCC) 2. Intellectual developmental disorder, mild History: transferred from [facility]. history of AH, paranoia. Admitted twice for psychosis, last in 2019.  No aggression in the past.previously on Abilify maintenance 300, clonazepam 0.5, benadryl 50 daily, haldol 10 mg daily.   Although she reports some concern at night, when she thought she heard somebody playing basketball outside, she denies any other psychotic symptoms.  Will continue current dose of Abilify to target schizophrenia.    3. Drug-induced weight gain - insurance does not cover her seeing a nutritionist Worsening in the context of immobility. Will uptitrate metformin for weight gain associated with antipsychotic use. Noted that she was successful in weight loss last year while being on Abilify.  Provided psychoeducation regarding behavioral activation.  She will be referred to therapy as outlined below.   4. Recurrent major depressive disorder, in partial  remission (HCC) Acute stressors include: her mother s/p stroke, loss of her dog  Other stressors include: losses including her uncle      Worsening in depressive symptoms in the context of loss of her dog in January.  Will continue current dose of sertraline to target depression and anxiety  for now given that this could be situational.  Provided psychoeducation regarding behavioral activation.  She will greatly benefit from CBT; will make a referral onsite.    # Insomnia - AHI 9 on HST 2024 Unstable. she agrees to work on behavioral activation for weight loss, which will be beneficial for insomnia.       Last checked  EKG HR 73, QTc467msec 07/2022  Lipid panels LDL 117 08/2022  HbA1c 6.1 06/6107     Plan Continue Abilify 15 mg at night - EKG. HR 73, QTc 425 msec 07/2022  Continue sertraline 25 mg at night  Increase metformin 500 mg twice a day  Referral to therapy  Next appointment: 5/12 at 8:30, video    The patient demonstrates the following risk factors for suicide: Chronic risk factors for suicide include: psychiatric disorder of schizophrenia . Acute risk factors for suicide include: N/A. Protective factors for this patient include: positive social support. Considering these factors, the overall suicide risk at this point appears to be low. Patient is appropriate for outpatient follow up.\  This visit involved a longitudinal and complex condition requiring extended medical decision-making, coordination of care, and management beyond what is typically captured in CPT 99214. The complexity of the patient's condition justifies the use of G2211.   Collaboration of Care: Collaboration of Care: Other reviewed notes in Epic, discussed plans with her sister  Patient/Guardian was advised Release of Information must be obtained prior to any record release in order to collaborate their care with an outside provider. Patient/Guardian was advised if they have not already done so to contact the registration department to sign all necessary forms in order for [place] to release information regarding their care.   Consent: Patient/Guardian gives verbal consent for treatment and assignment of benefits for services provided during this visit. Patient/Guardian expressed understanding and agreed  to proceed.    Neysa Hotter, MD 09/02/2023, 9:12 AM

## 2023-09-02 ENCOUNTER — Ambulatory Visit (INDEPENDENT_AMBULATORY_CARE_PROVIDER_SITE_OTHER): Payer: Medicare Other | Admitting: Psychiatry

## 2023-09-02 ENCOUNTER — Encounter: Payer: Self-pay | Admitting: Psychiatry

## 2023-09-02 ENCOUNTER — Encounter: Payer: Self-pay | Admitting: Oncology

## 2023-09-02 VITALS — BP 124/86 | HR 91 | Temp 98.7°F | Ht 62.5 in | Wt 203.6 lb

## 2023-09-02 DIAGNOSIS — F3341 Major depressive disorder, recurrent, in partial remission: Secondary | ICD-10-CM | POA: Diagnosis not present

## 2023-09-02 DIAGNOSIS — F203 Undifferentiated schizophrenia: Secondary | ICD-10-CM

## 2023-09-02 DIAGNOSIS — T50905A Adverse effect of unspecified drugs, medicaments and biological substances, initial encounter: Secondary | ICD-10-CM

## 2023-09-02 DIAGNOSIS — F70 Mild intellectual disabilities: Secondary | ICD-10-CM

## 2023-09-02 DIAGNOSIS — R635 Abnormal weight gain: Secondary | ICD-10-CM

## 2023-09-02 MED ORDER — METFORMIN HCL 500 MG PO TABS: 500.0000 mg | ORAL_TABLET | Freq: Two times a day (BID) | ORAL | 1 refills | Status: DC

## 2023-09-02 NOTE — Patient Instructions (Signed)
 Continue Abilify 15 mg at night  Continue sertraline 25 mg at night  Increase metformin 500 mg twice a day  Referral to therapy  Next appointment: 5/12 at 8:30

## 2023-09-15 ENCOUNTER — Other Ambulatory Visit: Payer: Self-pay | Admitting: Psychiatry

## 2023-09-15 DIAGNOSIS — F203 Undifferentiated schizophrenia: Secondary | ICD-10-CM

## 2023-10-08 DIAGNOSIS — Z853 Personal history of malignant neoplasm of breast: Secondary | ICD-10-CM | POA: Insufficient documentation

## 2023-10-20 NOTE — Progress Notes (Signed)
 [facility] MD/PA/NP OP Progress Note  10/27/2023 8:37 AM Karen Beard  MRN:  [id number]  Chief Complaint:  Chief Complaint  Patient presents with   Follow-up   HPI:  This is a follow-up appointment for schizophrenia, depression.  She presents to the visit by herself.  She states that [place], her sister could not come here as her mother had a hip fracture.  She was discharged from rehab yesterday.  She feels depressed due to her hip fracture, although she initially states that she has been doing fine.  She takes a walk 20 minutes.  She may do some exercise inside.  She likes walking.  She has not been able to work on photo album. She states that her mother was admitted six weeks ago.  She sees her neighbor friends. She has fair sleep.  She denies taking a nap.  She reports decrease in appetite.  She denies SI.  She has not noticed any side effects since uptitration of metformin .  She denies hallucinations.  She denies paranoia. She takes care of her own medication. She feels good that she was able to come here by herself.   Wt Readings from Last 3 Encounters:  10/27/23 201 lb 3.2 oz (91.3 kg)  09/02/23 203 lb 9.6 oz (92.4 kg)  07/25/23 197 lb (89.4 kg)         89.8 kg (198 lb) 09/11/2022 10:45 AM ED         Wt Readings from Last 3 Encounters:  06/25/22 198 lb 3.2 oz (89.9 kg)  04/29/22 198 lb 6.4 oz (90 kg)  01/07/22 172 lb 12.8 oz (78.4 kg)    12/06/21 175 lb 9.6 oz (79.7 kg)  02/29/20 192 lb 8 oz (87.3 kg)  08/20/19 207 lb 12.8 oz (94.3 kg)     Visit Diagnosis: No diagnosis found.  Past Psychiatric History:   Past Medical History:  Past Medical History:  Diagnosis Date   Abnormal uterine bleeding    Abnormal vaginal bleeding    Allergic genetic state    Anemia    Breast cancer (HCC) 01/26/2015   Oncoltype score: 3. T1a,No (isolated tumor cells_right breast lumpectomy with rad tx, ER/PR pos, her2 negative, INVASIVE MAMMARY CARCINOMA and DCIS of right breast    Carpal tunnel syndrome on both sides    Cysts of both ovaries    Depression    Edema of both legs    Fibroids    GERD (gastroesophageal reflux disease)    Hidradenitis    High triglycerides    Hypertension    RECENTLY TAKEN OFF OF MEDS PER MOM    Iron deficiency anemia    Learning disability    Migraine    daily   Mild intellectual disabilities    Personal history of radiation therapy    Pre-diabetes    Schizophrenia (HCC)    Seizures (HCC)    AS A CHILD-NONE SINCE    Past Surgical History:  Procedure Laterality Date   ABDOMINAL HYSTERECTOMY     AXILLARY HIDRADENITIS EXCISION     unc   AXILLARY SENTINEL NODE BIOPSY Right 02/23/2015   Sentinel lymph node biopsy x4, one with isolated tumor cells.;  Dr Lorel Roes, MD;  [facility]S;  ISOLATED TUMOR CELLS IN ONE LYMPH NODE (1/1).   BREAST BIOPSY Right 01/15/2016   stereo Dr Marquita Situ, fat necrosis   BREAST DUCTAL SYSTEM EXCISION Bilateral 01/26/2015   Bilateral retroareolar ductal excision for bloody nipple drainage, DCIS identified on the right  BREAST LUMPECTOMY Right 02/06/2015   Invasive mammary carcinoma.  5 mm, DCIS at margin.  Surgeon: Jerlean Mood, MD;  Location: [facility] ORS;  Service: General;  Laterality: Right;   CARPAL TUNNEL RELEASE Right 05/08/2016   Procedure: OPEN CARPAL TUNNEL RELEASE;  Surgeon: Elner Hahn, MD;  Location: [facility];  Service: Orthopedics;  Laterality: Right;   COLONOSCOPY WITH PROPOFOL  N/A 06/17/2023   Procedure: COLONOSCOPY WITH PROPOFOL ;  Surgeon: Shane Darling, MD;  Location: [facility];  Service: Endoscopy;  Laterality: N/A;   CYSTOSCOPY N/A 11/24/2017   Procedure: CYSTOSCOPY;  Surgeon: Prescilla Brod, MD;  Location: [facility] ORS;  Service: Gynecology;  Laterality: N/A;   EXCISION / BIOPSY BREAST / NIPPLE / DUCT Bilateral 01/26/2015   left breast duct ecstasis. right breast DCIS   INGUINAL HIDRADENITIS EXCISION     LAPAROSCOPIC BILATERAL SALPINGO OOPHERECTOMY Bilateral  11/24/2017   Procedure: LAPAROSCOPIC BILATERAL SALPINGO OOPHORECTOMY;  Surgeon: Prescilla Brod, MD;  Location: [facility] ORS;  Service: Gynecology;  Laterality: Bilateral;   LAPAROSCOPIC HYSTERECTOMY N/A 11/24/2017   Procedure: HYSTERECTOMY TOTAL LAPAROSCOPIC;  Surgeon: Prescilla Brod, MD;  Location: [facility] ORS;  Service: Gynecology;  Laterality: N/A;    Family Psychiatric History: Please see initial evaluation for full details. I have reviewed the history. No updates at this time.     Family History:  Family History  Problem Relation Age of Onset   Breast cancer Sister 56   Testicular cancer Father     Social History:  Social History   Socioeconomic History   Marital status: Single    Spouse name: Not on file   Number of children: Not on file   Years of education: Not on file   Highest education level: Not on file  Occupational History   Not on file  Tobacco Use   Smoking status: Never   Smokeless tobacco: Never  Vaping Use   Vaping status: Never Used  Substance and Sexual Activity   Alcohol use: No    Alcohol/week: 0.0 standard drinks of alcohol   Drug use: No   Sexual activity: Not on file  Other Topics Concern   Not on file  Social History Narrative   Not on file   Social Drivers of Health   Financial Resource Strain: Patient Declined (09/11/2022)   Received from [facility], [facility]   Overall Financial Resource Strain (CARDIA)    Difficulty of Paying Living Expenses: Patient declined  Food Insecurity: Patient Declined (09/11/2022)   Received from [facility], [facility]   Hunger Vital Sign    Worried About Running Out of Food in the Last Year: Patient declined    Ran Out of Food in the Last Year: Patient declined  Transportation Needs: Patient Declined (09/11/2022)   Received from [facility], [facility]   PRAPARE - Transportation    In the past 12  months, has lack of transportation kept you from medical appointments or from getting medications?: Patient declined    Lack of Transportation (Non-Medical): Patient declined  Physical Activity: Not on file  Stress: Not on file  Social Connections: Not on file    Allergies:  Allergies  Allergen Reactions   Sumatriptan Shortness Of Breath   Cephalexin Nausea And Vomiting   Chocolate Swelling   Corn Oil Other (See Comments)    By test. Pt has eaten without issues.   Peanut Oil Swelling and Other (See Comments)  throat   Peanut-Containing Drug Products Swelling   Phenobarbital Other (See Comments)    Hyperactivity, aggitation   Vilazodone Hcl Nausea Only   Duloxetine Hcl Anxiety and Other (See Comments)    agitation   Latex Rash and Other (See Comments)    Bandaids, gloves(when worn)   Sulfa Antibiotics Rash   Tape Rash    Metabolic Disorder Labs: Lab Results  Component Value Date   HGBA1C 5.1 06/12/2018   MPG 99.67 06/12/2018   MPG 100 03/13/2016   No results found for: "PROLACTIN" Lab Results  Component Value Date   CHOL 175 06/12/2018   TRIG 270 (H) 06/12/2018   HDL 31 (L) 06/12/2018   CHOLHDL 5.6 06/12/2018   VLDL 54 (H) 06/12/2018   LDLCALC 90 06/12/2018   LDLCALC 109 (H) 03/13/2016   Lab Results  Component Value Date   TSH 1.721 06/12/2018   TSH 1.864 03/13/2016    Therapeutic Level Labs: No results found for: "LITHIUM" No results found for: "VALPROATE" No results found for: "CBMZ"  Current Medications: Current Outpatient Medications  Medication Sig Dispense Refill   albuterol (VENTOLIN HFA) 108 (90 Base) MCG/ACT inhaler Inhale 2 puffs into the lungs every 6 (six) hours as needed.     ARIPiprazole  (ABILIFY ) 15 MG tablet Take 1 tablet (15 mg total) by mouth at bedtime. 30 tablet 3   aspirin -acetaminophen -caffeine  (EXCEDRIN  MIGRAINE) 250-250-65 MG per tablet Take 1 tablet by mouth every 6 (six) hours as needed for headache.      cetirizine (ZYRTEC)  10 MG tablet Take 10 mg by mouth daily as needed for allergies.      ergocalciferol (VITAMIN D2) 1.25 MG (50000 UT) capsule Take 50,000 Units by mouth once a week.     hydrochlorothiazide  (MICROZIDE ) 12.5 MG capsule TAKE 1 CAPSULE BY MOUTH ONCE DAILY. **TAKE WITH LOSARTAN  50 MG TABLET**     Icosapent Ethyl (VASCEPA) 1 g CAPS Take 2 g by mouth daily with lunch.     losartan  (COZAAR ) 50 MG tablet Take 1 tablet by mouth daily.     metFORMIN  (GLUCOPHAGE ) 500 MG tablet Take 1 tablet (500 mg total) by mouth 2 (two) times daily with a meal. 60 tablet 1   rizatriptan (MAXALT-MLT) 10 MG disintegrating tablet      sertraline  (ZOLOFT ) 25 MG tablet Take 1 tablet (25 mg total) by mouth at bedtime. 30 tablet 1   omeprazole (PRILOSEC) 20 MG capsule Take 1 capsule by mouth daily.     zonisamide (ZONEGRAN) 100 MG capsule Take 3 capsules by mouth daily.     No current facility-administered medications for this visit.     Musculoskeletal: Strength & Muscle Tone: within normal limits Gait & Station: normal Patient leans: N/A  Psychiatric Specialty Exam: Review of Systems  Psychiatric/Behavioral:  Positive for dysphoric mood. Negative for agitation, behavioral problems, confusion, decreased concentration, hallucinations, self-injury, sleep disturbance and suicidal ideas. The patient is not nervous/anxious and is not hyperactive.   All other systems reviewed and are negative.   Blood pressure 122/84, pulse 90, temperature 98 F (36.7 C), temperature source Temporal, height 5' 2.5" (1.588 m), weight 201 lb 3.2 oz (91.3 kg), last menstrual period 11/10/2017, SpO2 99%.Body mass index is 36.21 kg/m.  General Appearance: Well Groomed  Eye Contact:  Good  Speech:  Clear and Coherent  Volume:  Normal  Mood:  depressed  Affect:  Appropriate, Congruent, and Restricted  Thought Process:  Coherent  Orientation:  Full (Time, Place, and Person)  Thought Content:  Logical   Suicidal Thoughts:  No  Homicidal  Thoughts:  No  Memory:  Immediate;   Good  Judgement:  Good  Insight:  Good  Psychomotor Activity:  Normal  Concentration:  Concentration: Good and Attention Span: Good  Recall:  Good  Fund of Knowledge: Good  Language: Good  Akathisia:  No  Handed:  Right  AIMS (if indicated): not done  Assets:  Communication Skills Desire for Improvement  ADL's:  Intact  Cognition: WNL  Sleep:  Fair   Screenings: AUDIT    Flowsheet Row Admission (Discharged) from 06/12/2018 in [facility] Admission (Discharged) from 03/12/2016 in [facility]  Alcohol Use Disorder Identification Test Final Score (AUDIT) 0 0      GAD-7    Flowsheet Row Office Visit from 05/12/2023 in [facility] Office Visit from 06/25/2022 in [facility] Office Visit from 04/29/2022 in [facility] Office Visit from 01/07/2022 in [facility]  Total GAD-7 Score 7 4 9 3       PHQ2-9    Flowsheet Row Office Visit from 05/12/2023 in [facility] Office Visit from 06/25/2022 in [facility] Office Visit from 04/29/2022 in [facility] Office Visit from 01/07/2022 in [facility] Office Visit from 12/06/2021 in [facility]  PHQ-2 Total Score 1 2 1 2 4   PHQ-9 Total Score -- 4 6 2 9         Assessment and Plan:  Lamisha Lavigne is a 53 y.o. 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   1. Undifferentiated  schizophrenia (HCC) 2. Intellectual developmental disorder, mild History: transferred from [facility]. history of AH, paranoia. Admitted twice for psychosis, last in 2019.  No aggression in the past.previously on Abilify  maintenance 300, clonazepam 0.5, benadryl 50 daily, haldol 10 mg daily.   The exam is notable for repetitive speech, which is more likely attributable to intellectual disability.  She denies any psychotic symptoms.  Will continue current dose of Abilify  to target schizophrenia.   3. Drug-induced weight gain - insurance does not cover her seeing a nutritionist According to the patient, she has been more active lately.  Will continue current dose of metformin  for weight gain associated with antipsychotic use.  Noted that she was successful in weight loss last year while being on Abilify .  Provided psychoeducation regarding behavioral activation.  4. Recurrent major depressive disorder, in partial remission (HCC) Acute stressors include: her mother s/p stroke, loss of her dog  Other stressors include: losses including her uncle   Although she reports depressed mood in the context of her mother having hip fracture, she has been able to engage in daily activities such as taking a walk, seeing her neighbor friend.  Although it is difficult to discern whether she is experiencing depressive symptoms or if her presentation is more attributable to occasional sadness, her medication will not be adjusted at this time given her continued engagement in daily activities. It was noted that she was able to attend the visit on her own, which had previously been a concern raised by her sister regarding her level of independence. Will continue current  dose of sertraline  to target depression.  She will greatly benefit from CBT/behavioral activation; will make a referral.    # Insomnia - AHI 9 on HST 2024 Unstable. she agrees to work on behavioral activation for weight loss, which will be beneficial for  insomnia.          Last checked  EKG HR 73, QTc444msec 07/2022  Lipid panels LDL 117 08/2022  HbA1c 6.1 12/2534      Plan Continue Abilify  15 mg at night - EKG. HR 73, QTc 425 msec 07/2022  Continue sertraline  25 mg at night  Continue metformin  500 mg twice a day  Referred to therapy  Next appointment: 7/14 at 8:30, IP     The patient demonstrates the following risk factors for suicide: Chronic risk factors for suicide include: psychiatric disorder of schizophrenia . Acute risk factors for suicide include: N/A. Protective factors for this patient include: positive social support. Considering these factors, the overall suicide risk at this point appears to be low. Patient is appropriate for outpatient follow up.\  Collaboration of Care: Collaboration of Care: Other reviewed notes in Epic  Patient/Guardian was advised Release of Information must be obtained prior to any record release in order to collaborate their care with an outside provider. Patient/Guardian was advised if they have not already done so to contact the registration department to sign all necessary forms in order for us  to release information regarding their care.   Consent: Patient/Guardian gives verbal consent for treatment and assignment of benefits for services provided during this visit. Patient/Guardian expressed understanding and agreed to proceed.    Todd Fossa, MD 10/27/2023, 8:38 AM

## 2023-10-26 LAB — COLOGUARD: COLOGUARD: NEGATIVE

## 2023-10-26 LAB — EXTERNAL GENERIC LAB PROCEDURE: COLOGUARD: NEGATIVE

## 2023-10-27 ENCOUNTER — Encounter: Payer: Self-pay | Admitting: Psychiatry

## 2023-10-27 ENCOUNTER — Ambulatory Visit (INDEPENDENT_AMBULATORY_CARE_PROVIDER_SITE_OTHER): Admitting: Psychiatry

## 2023-10-27 VITALS — BP 122/84 | HR 90 | Temp 98.0°F | Ht 62.5 in | Wt 201.2 lb

## 2023-10-27 DIAGNOSIS — F203 Undifferentiated schizophrenia: Secondary | ICD-10-CM

## 2023-10-27 DIAGNOSIS — T50905A Adverse effect of unspecified drugs, medicaments and biological substances, initial encounter: Secondary | ICD-10-CM

## 2023-10-27 DIAGNOSIS — F3341 Major depressive disorder, recurrent, in partial remission: Secondary | ICD-10-CM | POA: Diagnosis not present

## 2023-10-27 DIAGNOSIS — F70 Mild intellectual disabilities: Secondary | ICD-10-CM | POA: Diagnosis not present

## 2023-10-27 DIAGNOSIS — R635 Abnormal weight gain: Secondary | ICD-10-CM | POA: Diagnosis not present

## 2023-10-27 MED ORDER — SERTRALINE HCL 25 MG PO TABS: 25.0000 mg | ORAL_TABLET | Freq: Every day | ORAL | 1 refills | Status: DC

## 2023-10-27 MED ORDER — METFORMIN HCL 500 MG PO TABS: 500.0000 mg | ORAL_TABLET | Freq: Two times a day (BID) | ORAL | 5 refills | Status: DC

## 2023-10-27 MED ORDER — ARIPIPRAZOLE 15 MG PO TABS: 15.0000 mg | ORAL_TABLET | Freq: Every day | ORAL | 5 refills | Status: DC

## 2023-10-27 NOTE — Patient Instructions (Signed)
 Continue Abilify  15 mg at night Continue sertraline  25 mg at night  Continue metformin  500 mg twice a day  Referred to therapy  Next appointment: 7/14 at 8:30

## 2023-11-06 ENCOUNTER — Ambulatory Visit (INDEPENDENT_AMBULATORY_CARE_PROVIDER_SITE_OTHER): Admitting: Professional Counselor

## 2023-11-06 DIAGNOSIS — F203 Undifferentiated schizophrenia: Secondary | ICD-10-CM

## 2023-11-06 NOTE — Progress Notes (Signed)
 Comprehensive Clinical Assessment (CCA) Note  11/06/2023 Karen Beard [id number]  Chief Complaint:  Chief Complaint  Patient presents with   Establish Care    "Depression."     Visit Diagnosis: Undifferentiated schizophrenia    CCA Screening, Triage and Referral (STR)  Patient Reported Information How did you hear about us ? Other (Comment)  Referral name: Dr. Edda Goo  Whom do you see for routine medical problems? Primary Care  Practice/Facility Name: [facility]  Name of Contact: Dr. Dean Every  What Is the Reason for Your Visit/Call Today? Establish therapy  How Long Has This Been Causing You Problems? > than 6 months  What Do You Feel Would Help You the Most Today? Treatment for Depression or other mood problem  Have You Recently Been in Any Inpatient Treatment (Hospital/Detox/Crisis Center/28-Day Program)? No  Have You Ever Received Services From [facility] Before? Yes  Who Do You See at [facility]? Dr. Edda Goo  Have You Recently Had Any Thoughts About Hurting Yourself? No  Are You Planning to Commit Suicide/Harm Yourself At This time? No  Have you Recently Had Thoughts About Hurting Someone Karen Beard? No  Have You Used Any Alcohol or Drugs in the Past 24 Hours? No  Do You Currently Have a Therapist/Psychiatrist? Yes  Name of Therapist/Psychiatrist: Dr. Edda Goo  Have You Been Recently Discharged From Any Office Practice or Programs? No    CCA Screening Triage Referral Assessment Type of Contact: Face-to-Face  Is this Initial or Reassessment? Initial  Collateral Involvement: None  Does Patient Have a Automotive engineer Guardian? No  Is CPS involved or ever been involved? In the Past ("Yes, she's in foster care. She's probably about 13. She can come back to me. I gave her up when she was a baby. I had here when she was like 54 years old and momma took her back.")  Is APS involved or ever been involved? Never  Patient Determined To Be At Risk  for Harm To Self or Others Based on Review of Patient Reported Information or Presenting Complaint? No  Are There Guns or Other Weapons in Your Home? No  Do You Have any Outstanding Charges, Pending Court Dates, Parole/Probation? No  Location of Assessment: Other (comment) ([facility])  Does Patient Present under Involuntary Commitment? No  [place] of Residence: [place]  Patient Currently Receiving the Following Services: Medication Management  Determination of Need: Routine (7 days)  Options For Referral: Outpatient Therapy   CCA Biopsychosocial Intake/Chief Complaint:  Depression  Current Symptoms/Problems: "Well like with momma, it depresses me. It's hard to get out and walk cause momma fell and broke her hip."  Patient Reported Schizophrenia/Schizoaffective Diagnosis in Past: Yes  Strengths: "Probably spending time with momma."  Preferences: "I like virtual."  Abilities: "Taking care of her."  Type of Services Patient Feels are Needed: "My eating habits, also weight loss."  Initial Clinical Notes/Concerns: No data recorded  Mental Health Symptoms Depression:  Change in energy/activity; Fatigue; Increase/decrease in appetite   Duration of Depressive symptoms: Greater than two weeks   Mania:  None   Anxiety:   Worrying; Restlessness   Psychosis:  Hallucinations   Duration of Psychotic symptoms: Greater than six months   Trauma:  None   Obsessions:  None   Compulsions:  None   Inattention:  None   Hyperactivity/Impulsivity:  None   Oppositional/Defiant Behaviors:  None   Emotional Irregularity:  None   Other Mood/Personality Symptoms:  No data recorded   Mental Status Exam Appearance and  self-care  Stature:  Average   Weight:  Overweight   Clothing:  Neat/clean   Grooming:  Well-groomed   Cosmetic use:  Age appropriate   Posture/gait:  Normal   Motor activity:  Slowed   Sensorium  Attention:  Normal   Concentration:  Normal    Orientation:  X5   Recall/memory:  Normal   Affect and Mood  Affect:  Blunted; Flat   Mood:  Dysphoric   Relating  Eye contact:  None   Facial expression:  Constricted   Attitude toward examiner:  Cooperative   Thought and Language  Speech flow: Slow   Thought content:  Appropriate to Mood and Circumstances   Preoccupation:  None   Hallucinations:  None   Organization:  No data recorded  [employer] of Knowledge:  Fair   Intelligence:  Below average   Abstraction:  Functional   Judgement:  Impaired   Reality Testing:  Realistic   Insight:  Lacking   Decision Making:  Confused; Vacilates; Only simple   Social Functioning  Social Maturity:  Responsible   Social Judgement:  Naive   Stress  Stressors:  Illness   Coping Ability:  Deficient supports   Skill Deficits:  Intellect/education; Decision making   Supports:  Friends/Service system; Family ("My sister and my momma and my niece and my sister's husband. And then I have friends.")       11/06/2023    9:07 AM 05/12/2023   10:37 AM 06/25/2022    3:02 PM  Depression screen PHQ 2/9  Decreased Interest 1 0 1  Down, Depressed, Hopeless 1 1 1   PHQ - 2 Score 2 1 2   Altered sleeping 0  1  Tired, decreased energy 1  0  Change in appetite 1  1  Feeling bad or failure about yourself  0  0  Trouble concentrating 0  0  Moving slowly or fidgety/restless 0  0  Suicidal thoughts 0  0  PHQ-9 Score 4  4  Difficult doing work/chores Somewhat difficult        11/06/2023    9:06 AM 05/12/2023   10:37 AM 06/25/2022    3:02 PM 04/29/2022   11:05 AM  GAD 7 : Generalized Anxiety Score  Nervous, Anxious, on Edge 0 1 1 1   Control/stop worrying 1 1 1 1   Worry too much - different things 1 1 1  0  Trouble relaxing 1 1 0 1  Restless 0 1 0 2  Easily annoyed or irritable 0 1 0 2  Afraid - awful might happen 0 1 1 2   Total GAD 7 Score 3 7 4 9   Anxiety Difficulty Somewhat difficult       Religion: Religion/Spirituality Are You A Religious Person?: Yes What is Your Religious Affiliation?: Environmental consultant: Leisure / Recreation Do You Have Hobbies?: Yes Leisure and Hobbies: "I like puzzles."  Exercise/Diet: Exercise/Diet Do You Exercise?: No Have You Gained or Lost A Significant Amount of Weight in the Past Six Months?: Yes-Lost Do You Follow a Special Diet?: No Do You Have Any Trouble Sleeping?: No   CCA Employment/Education Employment/Work Situation: Employment / Work Situation Employment Situation: On disability Why is Patient on Disability: "My learning disability." How Long has Patient Been on Disability: 20 years Patient's Job has Been Impacted by Current Illness: No What is the Longest Time Patient has Held a Job?: 15 years 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) Has Patient ever Been  in the Military?: No  Education: Education Is Patient Currently Attending School?: No Did Garment/textile technologist From [employer]?: Yes Did You Attend College?: No Did You Attend Graduate School?: No Did You Have An Individualized Education Program (IIEP): No Did You Have Any Difficulty At School?: Yes (Learning disability) Were Any Medications Ever Prescribed For These Difficulties?: No Patient's Education Has Been Impacted by Current Illness: No   CCA Family/Childhood History Family and Relationship History: Family history Marital status: Divorced Divorced, when?: 2012 What types of issues is patient dealing with in the relationship?: "He was with somebody else." Additional relationship information: "I dated a guy for ten years. It was a good relationship. He just got where we was arguing back and forth with each other." Are you sexually active?: No What is your sexual orientation?: Heterosexual Has your sexual activity been affected by drugs, alcohol, medication, or emotional stress?: "No." Does patient have children?:  Yes How many children?: 1 How is patient's relationship with their children?: One daughter, age 17, in foster care  Childhood History:  Childhood History By whom was/is the patient raised?: Both parents Additional childhood history information: Raised by both parents, describes childhood as "good." Description of patient's relationship with caregiver when they were a child: Mother - "It was good." Father - "It was good." Patient's description of current relationship with people who raised him/her: Mother - "Karen Beard best friends." Father - "My daddy passed away. It was a good relationship." Does patient have siblings?: Yes Number of Siblings: 2 Description of patient's current relationship with siblings: One sister and one brother who passed prior to her birth, "Me and my sister get along good together." Did patient suffer any verbal/emotional/physical/sexual abuse as a child?: No Did patient suffer from severe childhood neglect?: No Has patient ever been sexually abused/assaulted/raped as an adolescent or adult?: No Was the patient ever a victim of a crime or a disaster?: No Witnessed domestic violence?: No Has patient been affected by domestic violence as an adult?: No    CCA Substance Use Alcohol/Drug Use: Alcohol / Drug Use Pain Medications: See MAR Prescriptions: See MAR Over the Counter: See MAR History of alcohol / drug use?: No history of alcohol / drug abuse  ASAM's:  Six Dimensions of Multidimensional Assessment  Dimension 1:  Acute Intoxication and/or Withdrawal Potential:      Dimension 2:  Biomedical Conditions and Complications:      Dimension 3:  Emotional, Behavioral, or Cognitive Conditions and Complications:     Dimension 4:  Readiness to Change:     Dimension 5:  Relapse, Continued use, or Continued Problem Potential:     Dimension 6:  Recovery/Living Environment:     ASAM Severity Score:    ASAM Recommended Level of Treatment:     Substance use Disorder  (SUD) N/A    Recommendations for Services/Supports/Treatments: N/A    DSM5 Diagnoses: Patient Active Problem List   Diagnosis Date Noted   OSA (obstructive sleep apnea) 07/25/2023   Excessive daytime sleepiness 05/09/2023   Obesity, Class II, BMI 35-39.9 05/09/2023   Abnormal uterine bleeding (AUB) 08/19/2019   Allergy 08/19/2019   Cysts of both ovaries 08/19/2019   Depression 08/19/2019   Hemorrhoids 08/19/2019   Hidradenitis 08/19/2019   Menorrhagia with irregular cycle 08/19/2019   HTN (hypertension) 06/12/2018   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   Carpal tunnel syndrome on both sides 05/07/2016   Undifferentiated schizophrenia (HCC) 03/12/2016   Migraines 03/12/2016   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 She presents to [facility] to establish outpatient therapy services. She is already engaged in medication management with Dr. Edda Goo, initially evaluated on 11/09/21 and last seen on 10/27/23. Madelynne reported the following reasons for seeking therapy, "depression."  Giuliana appeared alert and oriented x5. She was neatly dressed and appeared well-groomed. She presented with flat/blunted affect and dysphoric mood. Her speech was slow but normal in tone/volume and her thought content/process was intact. She scored minimal on anxiety and depression screening. She denied SI/HI/AVH. She did not appear to be  responding to internal stimuli. Loralie denied a history of psychosis, although medical records state otherwise. She denied concerns for other mental health symptoms.   Rayvin was raised by both parents and described her childhood as "good." She stated she is "best friends" with her mother and reported the relationship with her father was "good" prior to his death. Tasheena has one sister and one brother. She reported her brother passed away before she was born. She maintains a good relationship with her sister. Corynn was married and divorced in 2012 due to his infidelity in the marriage. She has one daughter who is 88 and in foster care. Maryjane noted there have been times her daughter was in her care and she would like to get her back now.   Venna completed high school. 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 She enjoys doing puzzles.   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 She is in agreement with these recommendations. Shantina has been advised of confidentiality limitations and no-show policy.    Patient/Guardian was advised Release of Information must be obtained prior to any record release in order to collaborate their care with an outside provider. Patient/Guardian was advised if they have not already done so to contact the registration department to sign all necessary forms in order for us  to release information regarding their care.   Consent: Patient/Guardian gives verbal consent for treatment and assignment of benefits for services provided during this visit. Patient/Guardian expressed understanding and agreed to proceed.   Len Quale, LCMHC

## 2023-12-03 ENCOUNTER — Ambulatory Visit (INDEPENDENT_AMBULATORY_CARE_PROVIDER_SITE_OTHER): Admitting: Professional Counselor

## 2023-12-03 DIAGNOSIS — F330 Major depressive disorder, recurrent, mild: Secondary | ICD-10-CM | POA: Diagnosis not present

## 2023-12-03 DIAGNOSIS — F203 Undifferentiated schizophrenia: Secondary | ICD-10-CM | POA: Diagnosis not present

## 2023-12-03 NOTE — Progress Notes (Signed)
  THERAPIST PROGRESS NOTE  Session Time: 11:02 AM - 11:22 AM  Participation Level: Minimal  Behavioral Response: Casual, Drowsy, Dysphoric  Type of Therapy: Individual Therapy  Treatment Goals addressed: Active OP Depression  LTG: I won't be depressed. I just feel down about myself. My weight.    Start:  12/03/23    Expected End:  12/01/24     STG: To reduce symptoms of depression AEB reduction in PHQ9 scores over the next 12 weeks.   STG: To increase use of positive coping mechanisms AEB self-report over the next 12 weeks   ProgressTowards Goals: Initial  Interventions: Motivational Interviewing, Supportive, and Other: Coping skills  Summary: Cheyanne Lamison is a 53 y.o. female who presents with a history of depression and undifferentiated schizophrenia. She appeared drowsy but oriented x5. She stated she's been taking care of her mother who broke her hip about a month ago. Mackinley engaged in developing her treatment plan. She practiced boxed breathing in session. She appeared receptive to coping skills for depression. She reported ways she can start with exercise (walking) and talking with her sister/mother.   Therapist Response: Conducted session with [name]. Began session with check-in/update since previous session. Utilized empathetic and reflective listening. Developed treatment plan with input from Tonsina on current needs. Explained coping skills - boxed/paced breathing, behavior activation, socialization, and mindfulness. Inquired what ways Jereline can start to make changes. Confirmed next appointment and concluded session.   Suicidal/Homicidal: No  Plan: Return again in 4 weeks.  Diagnosis: Undifferentiated schizophrenia (HCC)  MDD (major depressive disorder), recurrent episode, mild (HCC)  Collaboration of Care: Medication Management AEB chart review  Patient/Guardian was advised Release of Information must be obtained prior to any record release in order to  collaborate their care with an outside provider. Patient/Guardian was advised if they have not already done so to contact the registration department to sign all necessary forms in order for us  to release information regarding their care.   Consent: Patient/Guardian gives verbal consent for treatment and assignment of benefits for services provided during this visit. Patient/Guardian expressed understanding and agreed to proceed.   Len Quale, [facility] 12/03/2023

## 2023-12-22 NOTE — Progress Notes (Signed)
 [facility] MD/PA/NP OP Progress Note  12/29/2023 9:03 AM Karen Beard  MRN:  [id number]  Chief Complaint:  Chief Complaint  Patient presents with   Follow-up   HPI:  This is a follow-up appointment for depression, schizophrenia.  She states that her mother had a hip fracture, and is using a walker.  She has been making progress, and completed physical therapy.  She states that she has been feeling depressed, and asks whether depression is a mood.  She states that although she does not really feel sad, she feels depressed, and feels tired of taking care of her mother.  She wakes up in the middle of the night to make sure helping her mother to go to the bathroom.  She denies anxiety except when she drives.  She always has good time with her mother, and was able to do barbecue.  She has also started floor exercise.  She has been able to take medication consistently.  She sleeps up to 6 hours.  She is concerned about her weight.  She denies SI, HI, hallucinations.  She denies paranoia.  She agrees with the plans as outlined.    Wt Readings from Last 3 Encounters:  12/29/23 204 lb 9.6 oz (92.8 kg)  10/27/23 201 lb 3.2 oz (91.3 kg)  09/02/23 203 lb 9.6 oz (92.4 kg)     Visit Diagnosis:    ICD-10-CM   1. Undifferentiated schizophrenia (HCC)  F20.3 sertraline  (ZOLOFT ) 50 MG tablet    2. Intellectual developmental disorder, mild  F70     3. MDD (major depressive disorder), recurrent episode, mild (HCC)  F33.0     4. Drug-induced weight gain  R63.5    T50.905A       Past Psychiatric History: Please see initial evaluation for full details. I have reviewed the history. No updates at this time.     Past Medical History:  Past Medical History:  Diagnosis Date   Abnormal uterine bleeding    Abnormal vaginal bleeding    Allergic genetic state    Anemia    Breast cancer (HCC) 01/26/2015   Oncoltype score: 3. T1a,No (isolated tumor cells_right breast lumpectomy with rad tx, ER/PR pos, her2  negative, INVASIVE MAMMARY CARCINOMA and DCIS of right breast   Carpal tunnel syndrome on both sides    Cysts of both ovaries    Depression    Edema of both legs    Fibroids    GERD (gastroesophageal reflux disease)    Hidradenitis    High triglycerides    Hypertension    RECENTLY TAKEN OFF OF MEDS PER MOM    Iron deficiency anemia    Learning disability    Migraine    daily   Mild intellectual disabilities    Personal history of radiation therapy    Pre-diabetes    Schizophrenia (HCC)    Seizures (HCC)    AS A CHILD-NONE SINCE    Past Surgical History:  Procedure Laterality Date   ABDOMINAL HYSTERECTOMY     AXILLARY HIDRADENITIS EXCISION     unc   AXILLARY SENTINEL NODE BIOPSY Right 02/23/2015   Sentinel lymph node biopsy x4, one with isolated tumor cells.;  Dr Dellie, MD;  [facility];  ISOLATED TUMOR CELLS IN ONE LYMPH NODE (1/1).   BREAST BIOPSY Right 01/15/2016   stereo Dr Dessa, fat necrosis   BREAST DUCTAL SYSTEM EXCISION Bilateral 01/26/2015   Bilateral retroareolar ductal excision for bloody nipple drainage, DCIS identified on the right  BREAST LUMPECTOMY Right 02/06/2015   Invasive mammary carcinoma.  5 mm, DCIS at margin.  Surgeon: Louanne KANDICE Muse, MD;  Location: [facility] ORS;  Service: General;  Laterality: Right;   CARPAL TUNNEL RELEASE Right 05/08/2016   Procedure: OPEN CARPAL TUNNEL RELEASE;  Surgeon: Norleen JINNY Maltos, MD;  Location: [facility];  Service: Orthopedics;  Laterality: Right;   COLONOSCOPY WITH PROPOFOL  N/A 06/17/2023   Procedure: COLONOSCOPY WITH PROPOFOL ;  Surgeon: Maryruth Ole DASEN, MD;  Location: [facility];  Service: Endoscopy;  Laterality: N/A;   CYSTOSCOPY N/A 11/24/2017   Procedure: CYSTOSCOPY;  Surgeon: Verdon Keen, MD;  Location: [facility] ORS;  Service: Gynecology;  Laterality: N/A;   EXCISION / BIOPSY BREAST / NIPPLE / DUCT Bilateral 01/26/2015   left breast duct ecstasis. right breast DCIS   INGUINAL HIDRADENITIS EXCISION      LAPAROSCOPIC BILATERAL SALPINGO OOPHERECTOMY Bilateral 11/24/2017   Procedure: LAPAROSCOPIC BILATERAL SALPINGO OOPHORECTOMY;  Surgeon: Verdon Keen, MD;  Location: [facility] ORS;  Service: Gynecology;  Laterality: Bilateral;   LAPAROSCOPIC HYSTERECTOMY N/A 11/24/2017   Procedure: HYSTERECTOMY TOTAL LAPAROSCOPIC;  Surgeon: Verdon Keen, MD;  Location: [facility] ORS;  Service: Gynecology;  Laterality: N/A;    Family Psychiatric History: Please see initial evaluation for full details. I have reviewed the history. No updates at this time.     Family History:  Family History  Problem Relation Age of Onset   Breast cancer Sister 67   Testicular cancer Father     Social History:  Social History   Socioeconomic History   Marital status: Single    Spouse name: Not on file   Number of children: Not on file   Years of education: Not on file   Highest education level: Not on file  Occupational History   Not on file  Tobacco Use   Smoking status: Never   Smokeless tobacco: Never  Vaping Use   Vaping status: Never Used  Substance and Sexual Activity   Alcohol use: No    Alcohol/week: 0.0 standard drinks of alcohol   Drug use: No   Sexual activity: Not on file  Other Topics Concern   Not on file  Social History Narrative   Not on file   Social Drivers of Health   Financial Resource Strain: Low Risk  (11/06/2023)   Overall Financial Resource Strain (CARDIA)    Difficulty of Paying Living Expenses: Not hard at all  Recent Concern: Financial Resource Strain - Medium Risk (10/07/2023)   [facility] from [facility]   Overall Financial Resource Strain (CARDIA)    Difficulty of Paying Living Expenses: Somewhat hard  Food Insecurity: No Food Insecurity (11/06/2023)   Hunger Vital Sign    Worried About Running Out of Food in the Last Year: Never true    Ran Out of Food in the Last Year: Never true  Transportation Needs: No Transportation Needs (11/06/2023)   PRAPARE -  Administrator, Civil Service (Medical): No    Lack of Transportation (Non-Medical): No  Physical Activity: Inactive (11/06/2023)   Exercise Vital Sign    Days of Exercise per Week: 0 days    Minutes of Exercise per Session: 0 min  Stress: Stress Concern Present (11/06/2023)   [employer] of Occupational Health - Occupational Stress Questionnaire    Feeling of Stress : To some extent  Social Connections: Socially Isolated (11/06/2023)   Social Connection and Isolation Panel    Frequency of Communication with Friends and Family: More than three  times a week    Frequency of Social Gatherings with Friends and Family: More than three times a week    Attends Religious Services: Never    Database administrator or Organizations: No    Attends Banker Meetings: Never    Marital Status: Divorced    Allergies:  Allergies  Allergen Reactions   Sumatriptan Shortness Of Breath   Cephalexin Nausea And Vomiting   Chocolate Swelling   Corn Oil Other (See Comments)    By test. Pt has eaten without issues.   Peanut Oil Swelling and Other (See Comments)    throat   Peanut-Containing Drug Products Swelling   Phenobarbital Other (See Comments)    Hyperactivity, aggitation   Vilazodone Hcl Nausea Only   Duloxetine Hcl Anxiety and Other (See Comments)    agitation   Latex Rash and Other (See Comments)    Bandaids, gloves(when worn)   Sulfa Antibiotics Rash   Tape Rash    Metabolic Disorder Labs: Lab Results  Component Value Date   HGBA1C 5.1 06/12/2018   MPG 99.67 06/12/2018   MPG 100 03/13/2016   No results found for: PROLACTIN Lab Results  Component Value Date   CHOL 175 06/12/2018   TRIG 270 (H) 06/12/2018   HDL 31 (L) 06/12/2018   CHOLHDL 5.6 06/12/2018   VLDL 54 (H) 06/12/2018   LDLCALC 90 06/12/2018   LDLCALC 109 (H) 03/13/2016   Lab Results  Component Value Date   TSH 1.721 06/12/2018   TSH 1.864 03/13/2016    Therapeutic Level Labs: No  results found for: LITHIUM No results found for: VALPROATE No results found for: CBMZ  Current Medications: Current Outpatient Medications  Medication Sig Dispense Refill   albuterol (VENTOLIN HFA) 108 (90 Base) MCG/ACT inhaler Inhale 2 puffs into the lungs every 6 (six) hours as needed.     ARIPiprazole  (ABILIFY ) 15 MG tablet Take 1 tablet (15 mg total) by mouth at bedtime. 30 tablet 5   aspirin -acetaminophen -caffeine  (EXCEDRIN  MIGRAINE) 250-250-65 MG per tablet Take 1 tablet by mouth every 6 (six) hours as needed for headache.      cetirizine (ZYRTEC) 10 MG tablet Take 10 mg by mouth daily as needed for allergies.      ergocalciferol (VITAMIN D2) 1.25 MG (50000 UT) capsule Take 50,000 Units by mouth once a week.     hydrochlorothiazide  (MICROZIDE ) 12.5 MG capsule TAKE 1 CAPSULE BY MOUTH ONCE DAILY. **TAKE WITH LOSARTAN  50 MG TABLET**     Icosapent Ethyl (VASCEPA) 1 g CAPS Take 2 g by mouth daily with lunch.     losartan  (COZAAR ) 50 MG tablet Take 1 tablet by mouth daily.     metFORMIN  (GLUCOPHAGE ) 500 MG tablet Take 1 tablet (500 mg total) by mouth 2 (two) times daily with a meal. 60 tablet 5   omeprazole (PRILOSEC) 20 MG capsule Take 1 capsule by mouth daily.     rizatriptan (MAXALT-MLT) 10 MG disintegrating tablet      zonisamide (ZONEGRAN) 100 MG capsule Take 3 capsules by mouth daily.     sertraline  (ZOLOFT ) 50 MG tablet Take 1 tablet (50 mg total) by mouth at bedtime. 30 tablet 1   No current facility-administered medications for this visit.     Musculoskeletal: Strength & Muscle Tone: within normal limits Gait & Station: normal Patient leans: N/A  Psychiatric Specialty Exam: Review of Systems  Psychiatric/Behavioral:  Positive for dysphoric mood and sleep disturbance. Negative for agitation, behavioral problems, confusion, decreased concentration, hallucinations,  self-injury and suicidal ideas. The patient is nervous/anxious. The patient is not hyperactive.   All other  systems reviewed and are negative.   Blood pressure 118/84, pulse 97, temperature 98.7 F (37.1 C), temperature source Temporal, height 5' 2.5 (1.588 m), weight 204 lb 9.6 oz (92.8 kg), last menstrual period 11/10/2017, SpO2 96%.Body mass index is 36.83 kg/m.  General Appearance: Well Groomed  Eye Contact:  Good  Speech:  Clear and Coherent  Volume:  Normal  Mood:  Depressed  Affect:  Appropriate, Congruent, and Restricted  Thought Process:  Coherent  Orientation:  Full (Time, Place, and Person)  Thought Content: Logical   Suicidal Thoughts:  No  Homicidal Thoughts:  No  Memory:  Immediate;   Good  Judgement:  Good  Insight:  Fair  Psychomotor Activity:  Normal, Normal tone, no rigidity, no resting/postural tremors, no tardive dyskinesia    Concentration:  Concentration: Good and Attention Span: Good  Recall:  Good  Fund of Knowledge: Good  Language: Good  Akathisia:  No  Handed:  Right  AIMS (if indicated): 0   Assets:  Communication Skills Desire for Improvement  ADL's:  Intact  Cognition: WNL  Sleep:  Fair   Screenings: AUDIT    Flowsheet Row Admission (Discharged) from 06/12/2018 in [name] Admission (Discharged) from 03/12/2016 in [facility]  Alcohol Use Disorder Identification Test Final Score (AUDIT) 0 0   GAD-7    Flowsheet Row Counselor from 11/06/2023 in [facility] Office Visit from 05/12/2023 in [facility] Office Visit from 06/25/2022 in [facility] Office Visit from 04/29/2022 in [facility] Office Visit from 01/07/2022 in [facility]  Total GAD-7 Score 3 7 4 9 3    PHQ2-9    Flowsheet Row Counselor from 11/06/2023 in [facility] Office Visit from 05/12/2023 in [facility] Office Visit from 06/25/2022 in [facility] Office Visit from 04/29/2022 in [facility] Office Visit from 01/07/2022 in [facility]  PHQ-2 Total Score 2 1 2 1 2   PHQ-9 Total Score 4 -- 4 6 2    Flowsheet Row Counselor from 11/06/2023 in [facility]  C-SSRS RISK CATEGORY No Risk     Assessment and Plan:  Karen Beard is a 53 y.o. 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   1. Undifferentiated schizophrenia (HCC) 2. Intellectual developmental disorder, mild History: transferred from [facility]. history of AH, paranoia. Admitted twice for psychosis, last in 2019.  No aggression in the past.previously on Abilify  maintenance 300, clonazepam 0.5, benadryl 50 daily, haldol 10 mg daily.   She denies any psychotic symptoms and relates visit.  Will continue current dose of Abilify  to target schizophrenia.   3. MDD (major depressive disorder), recurrent episode, mild (HCC) Acute stressors include: her mother s/p stroke, loss of her dog  Other stressors include: losses including her uncle   The exam is notable for a little more restricted affect, and she reports depressive symptoms in the context of taking care of her mother, who had hip fracture.  Regarding the recent worsening in depressive  symptoms, and her limited ability to engage in daily activities, will uptitrate sertraline  to optimize treatment for depression.  It was noted that she was able to attend the visit on her own, which had previously been a concern raised by her sister regarding her level of  independence.  Psychoeducation was provided regarding behavioral activation.  She will continue to see Ms. Veva for therapy.   4. Drug-induced weight gain - insurance does not cover her seeing a nutritionist  Will continue current dose of metformin  for weight gain associated with antipsychotic use. Noted that she was successful in weight loss last year while being on Abilify .  Provided psychoeducation regarding behavioral activation.  # Insomnia - AHI 9 on HST 2024 Unstable. she agrees to work on behavioral activation for weight loss, which will be beneficial for insomnia.          Last checked  EKG HR 73, QTc470msec 07/2022  Lipid panels LDL 117 08/2022  HbA1c 6.1 01/7973      Plan Continue Abilify  15 mg at night   Increase sertraline  50 mg at night  Continue metformin  500 mg twice a day  Next appointment: 8/26 at 8 AM, IP     The patient demonstrates the following risk factors for suicide: Chronic risk factors for suicide include: psychiatric disorder of schizophrenia . Acute risk factors for suicide include: N/A. Protective factors for this patient include: positive social support. Considering these factors, the overall suicide risk at this point appears to be low. Patient is appropriate for outpatient follow up.\    Collaboration of Care: Collaboration of Care: Other reviewed notes in Epic  Patient/Guardian was advised Release of Information must be obtained prior to any record release in order to collaborate their care with an outside provider. Patient/Guardian was advised if they have not already done so to contact the registration department to sign all necessary forms in order for us  to release information regarding their care.   Consent: Patient/Guardian gives verbal consent for treatment and assignment of benefits for services provided during this visit. Patient/Guardian expressed understanding and agreed to proceed.    Katheren Sleet, MD 12/29/2023, 9:03 AM

## 2023-12-29 ENCOUNTER — Encounter: Payer: Self-pay | Admitting: Psychiatry

## 2023-12-29 ENCOUNTER — Ambulatory Visit: Admitting: Psychiatry

## 2023-12-29 VITALS — BP 118/84 | HR 97 | Temp 98.7°F | Ht 62.5 in | Wt 204.6 lb

## 2023-12-29 DIAGNOSIS — F70 Mild intellectual disabilities: Secondary | ICD-10-CM | POA: Diagnosis not present

## 2023-12-29 DIAGNOSIS — T50905A Adverse effect of unspecified drugs, medicaments and biological substances, initial encounter: Secondary | ICD-10-CM

## 2023-12-29 DIAGNOSIS — F330 Major depressive disorder, recurrent, mild: Secondary | ICD-10-CM

## 2023-12-29 DIAGNOSIS — R635 Abnormal weight gain: Secondary | ICD-10-CM

## 2023-12-29 DIAGNOSIS — F203 Undifferentiated schizophrenia: Secondary | ICD-10-CM | POA: Diagnosis not present

## 2023-12-29 MED ORDER — SERTRALINE HCL 50 MG PO TABS: 50.0000 mg | ORAL_TABLET | Freq: Every day | ORAL | 1 refills | Status: DC

## 2023-12-29 NOTE — Patient Instructions (Signed)
 Continue Abilify  15 mg at night Increase sertraline  50 mg at night  Continue metformin  500 mg twice a day  Next appointment: 8/26 at 8 AM

## 2023-12-30 ENCOUNTER — Ambulatory Visit (INDEPENDENT_AMBULATORY_CARE_PROVIDER_SITE_OTHER): Admitting: Professional Counselor

## 2023-12-30 DIAGNOSIS — F203 Undifferentiated schizophrenia: Secondary | ICD-10-CM

## 2023-12-30 NOTE — Progress Notes (Signed)
  THERAPIST PROGRESS NOTE  Session Time: 11:00 AM - 11:23 AM   Participation Level: Active  Behavioral Response: Casual, Alert, Dysphoric  Type of Therapy: Individual Therapy  Treatment Goals addressed: Active OP Depression  LTG: I won't be depressed. I just feel down about myself. My weight.                Start:  12/03/23    Expected End:  12/01/24      STG: To reduce symptoms of depression AEB reduction in PHQ9 scores over the next 12 weeks.    STG: To increase use of positive coping mechanisms AEB self-report over the next 12 weeks   ProgressTowards Goals: Progressing  Interventions: Motivational Interviewing, Solution Focused, and Supportive  Summary: Karen Beard is a 53 y.o. female who presents with a history of depression and undifferentiated schizophrenia. She appeared somber but oriented x5. She stated she continues to struggle with depression. She has been able to start exercising and completed 2 days a week. She was in agreement to try for 4 days next week. [name] continues to take care of her mother, which adds to her stress. She has been having migraines. She is waiting on referral from her PCP to a neurologist. She was receptive to checking with her provider about magnesium  glycinate as an helpful supplement. [name] engaged in strengths exploration exercise. She will attempt to work on this plan of action.   Therapist Response: Conducted session with [name]. Began session with check-in/update since previous session. Utilized empathetic and reflective listening. Used open-ended questions to facilitate discussion and summarized thoughts/feelings. Shared possible supplement - magnesium  glycinate - to help with migraines. Provided article to note benefits. [name] to check with her doctor before addition of this supplement. Explored contributing factors to depression and encouraged [name] to add additional days of exercise as she noted improvement of mood  on those days. Engaged [name] in strengths exploration exercise. Scheduled additional appointment and concluded session.   Suicidal/Homicidal: No  Plan: Return again in 2 weeks.  Diagnosis: Undifferentiated schizophrenia (HCC)  Collaboration of Care: Medication Management AEB chart review  Patient/Guardian was advised Release of Information must be obtained prior to any record release in order to collaborate their care with an outside provider. Patient/Guardian was advised if they have not already done so to contact the registration department to sign all necessary forms in order for us  to release information regarding their care.   Consent: Patient/Guardian gives verbal consent for treatment and assignment of benefits for services provided during this visit. Patient/Guardian expressed understanding and agreed to proceed.   [name], [facility] 12/30/2023

## 2024-01-12 ENCOUNTER — Ambulatory Visit (INDEPENDENT_AMBULATORY_CARE_PROVIDER_SITE_OTHER): Admitting: Professional Counselor

## 2024-01-12 DIAGNOSIS — F203 Undifferentiated schizophrenia: Secondary | ICD-10-CM | POA: Diagnosis not present

## 2024-01-12 NOTE — Progress Notes (Signed)
  THERAPIST PROGRESS NOTE  Session Time: 10:45 AM - 11:20 AM   Participation Level: Active  Behavioral Response: Casual, Alert, Dysphoric  Type of Therapy: Individual Therapy  Treatment Goals addressed: Active OP Depression  LTG: I won't be depressed. I just feel down about myself. My weight.                Start:  12/03/23    Expected End:  12/01/24      STG: To reduce symptoms of depression AEB reduction in PHQ9 scores over the next 12 weeks.    STG: To increase use of positive coping mechanisms AEB self-report over the next 12 weeks   ProgressTowards Goals: Progressing  Interventions: Motivational Interviewing, Solution Focused, and Supportive  Summary: Dorella Laster is a 53 y.o. female who presents with undifferentiated schizophrenia. She appeared alert and oriented x5. She stated things are going okay. She was able to exercise and practice some of her strengths. She is still struggling with her weight. She engaged in completing a food journal entry to identify calories and nutrition. She took note of a resource that my offer nutritional counseling. Markeshia received a blank food journal to help her track her diet.   Therapist Response: Conducted session with [name]. Began session with check-in/update since previous session. Utilized empathetic and reflective listening. Used open-ended questions to facilitate discussion and summarized thoughts/feelings. Explored ways to increase weight loss, explained food journal, and practiced identifying diet/nutrition. Encouraged Arelyn to continue with exercise and improve diet. Scheduled additional appointment and concluded session.   Suicidal/Homicidal: No  Plan: Return again in 4 weeks.  Diagnosis: Undifferentiated schizophrenia (HCC)  Collaboration of Care: Medication Management AEB chart review  Patient/Guardian was advised Release of Information must be obtained prior to any record release in order to collaborate their  care with an outside provider. Patient/Guardian was advised if they have not already done so to contact the registration department to sign all necessary forms in order for us  to release information regarding their care.   Consent: Patient/Guardian gives verbal consent for treatment and assignment of benefits for services provided during this visit. Patient/Guardian expressed understanding and agreed to proceed.   [name], [facility] 01/12/2024

## 2024-01-13 NOTE — Progress Notes (Signed)
 CC: Chief Complaint  Patient presents with  . Nail Problem    Thick and discolored toenails.     Karen Beard presents for painful thick toenails on the left foot.   Objective: All 5 toenails on the left foot are thick, dystrophic, discolored, brittle with subungual debris.  Assessment: Encounter Diagnoses  Name Primary?  . Dermatophytosis of nail Yes  . Pain of toe of left foot     Plan: Debridement of all 5 toenails on the left foot in length and thickness sharply using toenail nippers with trimming of the toenails on the right as a courtesy.  Patient will return to clinic as needed.  No orders of the defined types were placed in this encounter.

## 2024-01-28 ENCOUNTER — Other Ambulatory Visit: Payer: Self-pay | Admitting: Psychiatry

## 2024-01-28 DIAGNOSIS — F203 Undifferentiated schizophrenia: Secondary | ICD-10-CM

## 2024-02-07 NOTE — Progress Notes (Unsigned)
 BH MD/PA/NP OP Progress Note  02/10/2024 8:26 AM Karen Beard  MRN:  [id number]  Chief Complaint:  Chief Complaint  Patient presents with   Follow-up   HPI:  This is a follow-up appointment for depression, schizophrenia.  She states that her mother has been doing better. Karen Beard states that she is not as depressed.  She spends time watching TV.  She also takes a walk.  She has been working on losing weight.  She measures her food, and tries to eat healthy.  She goes to grocery shopping, doing laundry and cleaning.  She tends to feel anxious when she drives interstate.  However, she denies any panic attacks and she feels good when she is at home.  She sleeps well except that she wakes up in the middle of the night to help her mother going to the bathroom.  She denies feeling depressed.  She enjoys doing 1000 pieces of puzzle.  She denies SI, HI, hallucinations.  She denies paranoia.  She denies alcohol use or drug use.  She feels comfortable to stay on the current medication regimen.   Wt Readings from Last 3 Encounters:  02/10/24 204 lb (92.5 kg)  12/29/23 204 lb 9.6 oz (92.8 kg)  10/27/23 201 lb 3.2 oz (91.3 kg)     89.8 kg (198 lb) 09/11/2022 10:45 AM ED         Wt Readings from Last 3 Encounters:  06/25/22 198 lb 3.2 oz (89.9 kg)  04/29/22 198 lb 6.4 oz (90 kg)  01/07/22 172 lb 12.8 oz (78.4 kg)    12/06/21 175 lb 9.6 oz (79.7 kg)  02/29/20 192 lb 8 oz (87.3 kg)  08/20/19 207 lb 12.8 oz (94.3 kg)    Visit Diagnosis:    ICD-10-CM   1. 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 Intellectual developmental disorder, mild  F70     3. MDD (major depressive disorder), recurrent, in partial remission (HCC)  F33.41     4. Drug-induced weight gain  R63.5    T50.905A       Past Psychiatric History: Please see initial evaluation for full details. I have reviewed the history. No updates at this time.     Past Medical History:  Past Medical History:  Diagnosis  Date   Abnormal uterine bleeding    Abnormal vaginal bleeding    Allergic genetic state    Anemia    Breast cancer (HCC) 01/26/2015   Oncoltype score: 3. T1a,No (isolated tumor cells_right breast lumpectomy with rad tx, ER/PR pos, her2 negative, INVASIVE MAMMARY CARCINOMA and DCIS of right breast   Carpal tunnel syndrome on both sides    Cysts of both ovaries    Depression    Edema of both legs    Fibroids    GERD (gastroesophageal reflux disease)    Hidradenitis    High triglycerides    Hypertension    RECENTLY TAKEN OFF OF MEDS PER MOM    Iron deficiency anemia    Learning disability    Migraine    daily   Mild intellectual disabilities    Personal history of radiation therapy    Pre-diabetes    Schizophrenia (HCC)    Seizures (HCC)    AS A CHILD-NONE SINCE    Past Surgical History:  Procedure Laterality Date   ABDOMINAL HYSTERECTOMY     AXILLARY HIDRADENITIS EXCISION     unc   AXILLARY SENTINEL NODE BIOPSY Right 02/23/2015   Sentinel lymph  node biopsy x4, one with isolated tumor cells.;  Dr Dellie, MD;  [facility];  ISOLATED TUMOR CELLS IN ONE LYMPH NODE (1/1).   BREAST BIOPSY Right 01/15/2016   stereo Dr Dessa, fat necrosis   BREAST DUCTAL SYSTEM EXCISION Bilateral 01/26/2015   Bilateral retroareolar ductal excision for bloody nipple drainage, DCIS identified on the right   BREAST LUMPECTOMY Right 02/06/2015   Invasive mammary carcinoma.  5 mm, DCIS at margin.  Surgeon: Louanne KANDICE Dellie, MD;  Location: [facility] ORS;  Service: General;  Laterality: Right;   CARPAL TUNNEL RELEASE Right 05/08/2016   Procedure: OPEN CARPAL TUNNEL RELEASE;  Surgeon: Norleen JINNY Maltos, MD;  Location: [facility];  Service: Orthopedics;  Laterality: Right;   COLONOSCOPY WITH PROPOFOL  N/A 06/17/2023   Procedure: COLONOSCOPY WITH PROPOFOL ;  Surgeon: Maryruth Ole DASEN, MD;  Location: [facility];  Service: Endoscopy;  Laterality: N/A;   CYSTOSCOPY N/A 11/24/2017   Procedure:  CYSTOSCOPY;  Surgeon: Verdon Keen, MD;  Location: [facility] ORS;  Service: Gynecology;  Laterality: N/A;   EXCISION / BIOPSY BREAST / NIPPLE / DUCT Bilateral 01/26/2015   left breast duct ecstasis. right breast DCIS   INGUINAL HIDRADENITIS EXCISION     LAPAROSCOPIC BILATERAL SALPINGO OOPHERECTOMY Bilateral 11/24/2017   Procedure: LAPAROSCOPIC BILATERAL SALPINGO OOPHORECTOMY;  Surgeon: Verdon Keen, MD;  Location: [facility] ORS;  Service: Gynecology;  Laterality: Bilateral;   LAPAROSCOPIC HYSTERECTOMY N/A 11/24/2017   Procedure: HYSTERECTOMY TOTAL LAPAROSCOPIC;  Surgeon: Verdon Keen, MD;  Location: [facility] ORS;  Service: Gynecology;  Laterality: N/A;    Family Psychiatric History: Please see initial evaluation for full details. I have reviewed the history. No updates at this time.     Family History:  Family History  Problem Relation Age of Onset   Breast cancer Sister 78   Testicular cancer Father     Social History:  Social History   Socioeconomic History   Marital status: Single    Spouse name: Not on file   Number of children: Not on file   Years of education: Not on file   Highest education level: Not on file  Occupational History   Not on file  Tobacco Use   Smoking status: Never   Smokeless tobacco: Never  Vaping Use   Vaping status: Never Used  Substance and Sexual Activity   Alcohol use: No    Alcohol/week: 0.0 standard drinks of alcohol   Drug use: No   Sexual activity: Not on file  Other Topics Concern   Not on file  Social History Narrative   Not on file   Social Drivers of Health   Financial Resource Strain: Low Risk  (11/06/2023)   Overall Financial Resource Strain (CARDIA)    Difficulty of Paying Living Expenses: Not hard at all  Recent Concern: Financial Resource Strain - Medium Risk (10/07/2023)   Received from [facility]   Overall Financial Resource Strain (CARDIA)    Difficulty of Paying Living Expenses: Somewhat hard  Food  Insecurity: No Food Insecurity (11/06/2023)   Hunger Vital Sign    Worried About Running Out of Food in the Last Year: Never true    Ran Out of Food in the Last Year: Never true  Transportation Needs: No Transportation Needs (11/06/2023)   PRAPARE - Administrator, Civil Service (Medical): No    Lack of Transportation (Non-Medical): No  Physical Activity: Inactive (11/06/2023)   Exercise Vital Sign    Days of Exercise per Week: 0 days  Minutes of Exercise per Session: 0 min  Stress: Stress Concern Present (11/06/2023)   [employer] of Occupational Health - Occupational Stress Questionnaire    Feeling of Stress : To some extent  Social Connections: Socially Isolated (11/06/2023)   Social Connection and Isolation Panel    Frequency of Communication with Friends and Family: More than three times a week    Frequency of Social Gatherings with Friends and Family: More than three times a week    Attends Religious Services: Never    Database administrator or Organizations: No    Attends Banker Meetings: Never    Marital Status: Divorced    Allergies:  Allergies  Allergen Reactions   Sumatriptan Shortness Of Breath   Cephalexin Nausea And Vomiting   Chocolate Swelling   Corn Oil Other (See Comments)    By test. Pt has eaten without issues.   Peanut Oil Swelling and Other (See Comments)    throat   Peanut-Containing Drug Products Swelling   Phenobarbital Other (See Comments)    Hyperactivity, aggitation   Vilazodone Hcl Nausea Only   Duloxetine Hcl Anxiety and Other (See Comments)    agitation   Latex Rash and Other (See Comments)    Bandaids, gloves(when worn)   Sulfa Antibiotics Rash   Tape Rash    Metabolic Disorder Labs: Lab Results  Component Value Date   HGBA1C 5.1 06/12/2018   MPG 99.67 06/12/2018   MPG 100 03/13/2016   No results found for: PROLACTIN Lab Results  Component Value Date   CHOL 175 06/12/2018   TRIG 270 (H) 06/12/2018    HDL 31 (L) 06/12/2018   CHOLHDL 5.6 06/12/2018   VLDL 54 (H) 06/12/2018   LDLCALC 90 06/12/2018   LDLCALC 109 (H) 03/13/2016   Lab Results  Component Value Date   TSH 1.721 06/12/2018   TSH 1.864 03/13/2016    Therapeutic Level Labs: No results found for: LITHIUM No results found for: VALPROATE No results found for: CBMZ  Current Medications: Current Outpatient Medications  Medication Sig Dispense Refill   albuterol (VENTOLIN HFA) 108 (90 Base) MCG/ACT inhaler Inhale 2 puffs into the lungs every 6 (six) hours as needed.     ARIPiprazole  (ABILIFY ) 15 MG tablet Take 1 tablet (15 mg total) by mouth at bedtime. 30 tablet 5   aspirin -acetaminophen -caffeine  (EXCEDRIN  MIGRAINE) 250-250-65 MG per tablet Take 1 tablet by mouth every 6 (six) hours as needed for headache.      cetirizine (ZYRTEC) 10 MG tablet Take 10 mg by mouth daily as needed for allergies.      ergocalciferol (VITAMIN D2) 1.25 MG (50000 UT) capsule Take 50,000 Units by mouth once a week.     hydrochlorothiazide  (MICROZIDE ) 12.5 MG capsule TAKE 1 CAPSULE BY MOUTH ONCE DAILY. **TAKE WITH LOSARTAN  50 MG TABLET**     Icosapent Ethyl (VASCEPA) 1 g CAPS Take 2 g by mouth daily with lunch.     losartan  (COZAAR ) 50 MG tablet Take 1 tablet by mouth daily.     metFORMIN  (GLUCOPHAGE ) 500 MG tablet Take 1 tablet (500 mg total) by mouth 2 (two) times daily with a meal. 60 tablet 5   omeprazole (PRILOSEC) 20 MG capsule Take 1 capsule by mouth daily.     rizatriptan (MAXALT-MLT) 10 MG disintegrating tablet      sertraline  (ZOLOFT ) 50 MG tablet Take 1 tablet (50 mg total) by mouth at bedtime. 30 tablet 1   zonisamide (ZONEGRAN) 100 MG capsule Take 3  capsules by mouth daily.     No current facility-administered medications for this visit.     Musculoskeletal: Strength & Muscle Tone: within normal limits Gait & Station: normal Patient leans: N/A  Psychiatric Specialty Exam: Review of Systems  Psychiatric/Behavioral:   Positive for sleep disturbance. Negative for agitation, behavioral problems, confusion, decreased concentration, dysphoric mood, hallucinations, self-injury and suicidal ideas. The patient is nervous/anxious. The patient is not hyperactive.   All other systems reviewed and are negative.   Blood pressure 124/80, pulse 91, temperature 98.4 F (36.9 C), temperature source Temporal, height 5' 2.5 (1.588 m), weight 204 lb (92.5 kg), last menstrual period 11/10/2017, SpO2 98%.Body mass index is 36.72 kg/m.  General Appearance: Well Groomed  Eye Contact:  Good  Speech:  Clear and Coherent  Volume:  Normal  Mood:  good  Affect:  Appropriate, Congruent, and calm  Thought Process:  Coherent  Orientation:  Full (Time, Place, and Person)  Thought Content: Logical   Suicidal Thoughts:  No  Homicidal Thoughts:  No  Memory:  Immediate;   Good  Judgement:  Good  Insight:  Good  Psychomotor Activity:  Normal, Normal tone, no rigidity, no resting/postural tremors, no tardive dyskinesia    Concentration:  Concentration: Good and Attention Span: Good  Recall:  Good  Fund of Knowledge: Good  Language: Good  Akathisia:  No  Handed:  Right  AIMS (if indicated): 0  Assets:  Communication Skills Desire for Improvement  ADL's:  Intact  Cognition: WNL  Sleep:  Fair   Screenings: AUDIT    Flowsheet Row Admission (Discharged) from 06/12/2018 in [facility] Admission (Discharged) from 03/12/2016 in [facility]  Alcohol Use Disorder Identification Test Final Score (AUDIT) 0 0   GAD-7    Flowsheet Row Counselor from 11/06/2023 in [facility] Office Visit from 05/12/2023 in [facility] Office Visit from 06/25/2022 in [facility] Office Visit from 04/29/2022 in [facility] Office Visit from 01/07/2022 in  [facility]  Total GAD-7 Score 3 7 4 9 3    PHQ2-9    Flowsheet Row Counselor from 11/06/2023 in [facility] Office Visit from 05/12/2023 in [facility] Office Visit from 06/25/2022 in [facility] Office Visit from 04/29/2022 in [facility] Office Visit from 01/07/2022 in [facility]  PHQ-2 Total Score 2 1 2 1 2   PHQ-9 Total Score 4 -- 4 6 2    Flowsheet Row Counselor from 11/06/2023 in [facility]  C-SSRS RISK CATEGORY No Risk     Assessment and Plan:  Katalia Choma is a 53 y.o. 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   1. Undifferentiated schizophrenia (HCC) 2. Intellectual developmental disorder, mild History: transferred from [facility]. history of AH, paranoia. Admitted twice for psychosis, last in 2019.  No aggression in the past.previously on Abilify  maintenance 300, clonazepam 0.5, benadryl 50 daily, haldol 10 mg daily.   She denies any psychotic symptoms since the last visit.  Will continue current dose of Abilify  to target schizophrenia.  3. MDD (major depressive disorder), recurrent episode, in partial  remission (HCC) Although she continues to demonstrate restricted affect, she reports overall improvement in depressive symptoms since uptitration of sertraline .  She has been able to do household tasks, and has been actively working on [employer] and taking a walk.  Will continue sertraline  to target depression.  She will greatly benefit from CBT; she will continue to see  Ms. Veva for therapy.   4. Drug-induced weight gain - insurance does not cover her seeing a nutritionist   Will continue current dose of metformin  for weight gain associated with antipsychotic use. Noted that she was successful in weight loss last year while being on the same dose of Abilify .  Provided psychoeducation regarding behavioral activation.   # Insomnia - AHI 9 on HST 2024 Unstable. she agrees to work on behavioral activation for weight loss, which will be beneficial for insomnia.         Last checked  EKG HR 73, QTc463msec 07/2022  Lipid panels LDL 117 08/2022  HbA1c 6.1 01/7973      Plan Continue Abilify  15 mg at night   Continue sertraline  50 mg at night  Continue metformin  500 mg twice a day  Next appointment: 10/27 at 8 am, IP     The patient demonstrates the following risk factors for suicide: Chronic risk factors for suicide include: psychiatric disorder of schizophrenia . Acute risk factors for suicide include: N/A. Protective factors for this patient include: positive social support. Considering these factors, the overall suicide risk at this point appears to be low. Patient is appropriate for outpatient follow up.\    Collaboration of Care: Collaboration of Care: Other reviewed notes in Epic  Patient/Guardian was advised Release of Information must be obtained prior to any record release in order to collaborate their care with an outside provider. Patient/Guardian was advised if they have not already done so to contact the registration department to sign all necessary forms in order for us  to release information regarding their care.   Consent: Patient/Guardian gives verbal consent for treatment and assignment of benefits for services provided during this visit. Patient/Guardian expressed understanding and agreed to proceed.    Katheren Sleet, MD 02/10/2024, 8:26 AM

## 2024-02-10 ENCOUNTER — Ambulatory Visit (INDEPENDENT_AMBULATORY_CARE_PROVIDER_SITE_OTHER): Admitting: Psychiatry

## 2024-02-10 ENCOUNTER — Encounter: Payer: Self-pay | Admitting: Psychiatry

## 2024-02-10 VITALS — BP 124/80 | HR 91 | Temp 98.4°F | Ht 62.5 in | Wt 204.0 lb

## 2024-02-10 DIAGNOSIS — F3341 Major depressive disorder, recurrent, in partial remission: Secondary | ICD-10-CM

## 2024-02-10 DIAGNOSIS — T50905A Adverse effect of unspecified drugs, medicaments and biological substances, initial encounter: Secondary | ICD-10-CM

## 2024-02-10 DIAGNOSIS — R635 Abnormal weight gain: Secondary | ICD-10-CM

## 2024-02-10 DIAGNOSIS — F203 Undifferentiated schizophrenia: Secondary | ICD-10-CM

## 2024-02-10 DIAGNOSIS — F70 Mild intellectual disabilities: Secondary | ICD-10-CM | POA: Diagnosis not present

## 2024-02-10 NOTE — Patient Instructions (Signed)
 Continue Abilify  15 mg at night   Continue sertraline  50 mg at night  Continue metformin  500 mg twice a day  Next appointment: 10/27 at 8 am

## 2024-02-11 ENCOUNTER — Ambulatory Visit (INDEPENDENT_AMBULATORY_CARE_PROVIDER_SITE_OTHER): Admitting: Professional Counselor

## 2024-02-11 DIAGNOSIS — F203 Undifferentiated schizophrenia: Secondary | ICD-10-CM | POA: Diagnosis not present

## 2024-02-11 DIAGNOSIS — F330 Major depressive disorder, recurrent, mild: Secondary | ICD-10-CM | POA: Diagnosis not present

## 2024-02-11 NOTE — Progress Notes (Signed)
  THERAPIST PROGRESS NOTE  Session Time: 10:04 AM - 10:24 AM  Participation Level: Minimal  Behavioral Response: Casual, Alert, Euthymic  Type of Therapy: Individual Therapy  Treatment Goals addressed: Active OP Depression  LTG: I won't be depressed. I just feel down about myself. My weight.                Start:  12/03/23    Expected End:  12/01/24      STG: To reduce symptoms of depression AEB reduction in PHQ9 scores over the next 12 weeks.    STG: To increase use of positive coping mechanisms AEB self-report over the next 12 weeks   ProgressTowards Goals: Progressing  Interventions: Motivational Interviewing, Solution Focused and Supportive  Summary: Izabelle Daus is a 53 y.o. female who presents with a history of undifferentiated schizophrenia and depression. She appeared alert and oriented x5. She reported she had her medication appointment yesterday and they aren't making any changes at this time. Exie reported she is doing well. She purchased a new puzzle but hasn't started yet. She continues to work on goal of losing weight and noted ways she is working on this. She was receptive to chair exercises/yoga and was given handout she can practice with.   Therapist Response: Conducted session with [name]. Began session with check-in/update since previous session. Utilized empathetic and reflective listening. Explored ways to continue working on personal goals. Discussed obstacles and identified resources to help. Scheduled additional appointment and concluded session.   Suicidal/Homicidal: No  Plan: Return again in 3 weeks.  Diagnosis: Undifferentiated schizophrenia (HCC)  MDD (major depressive disorder), recurrent episode, mild (HCC)  Collaboration of Care: Medication Management AEB chart review  Patient/Guardian was advised Release of Information must be obtained prior to any record release in order to collaborate their care with an outside provider.  Patient/Guardian was advised if they have not already done so to contact the registration department to sign all necessary forms in order for us  to release information regarding their care.   Consent: Patient/Guardian gives verbal consent for treatment and assignment of benefits for services provided during this visit. Patient/Guardian expressed understanding and agreed to proceed.   [name], [facility] 02/11/2024

## 2024-03-02 ENCOUNTER — Ambulatory Visit: Admitting: Professional Counselor

## 2024-03-09 ENCOUNTER — Ambulatory Visit (INDEPENDENT_AMBULATORY_CARE_PROVIDER_SITE_OTHER): Admitting: Professional Counselor

## 2024-03-09 DIAGNOSIS — F203 Undifferentiated schizophrenia: Secondary | ICD-10-CM

## 2024-03-09 DIAGNOSIS — F3341 Major depressive disorder, recurrent, in partial remission: Secondary | ICD-10-CM

## 2024-03-09 NOTE — Progress Notes (Signed)
  THERAPIST PROGRESS NOTE  Session Time: 1:08 PM - 1:28 PM   Participation Level: Active  Behavioral Response: Casual, Alert, Euthymic  Type of Therapy: Individual Therapy  Treatment Goals addressed: Active OP Depression  LTG: I won't be depressed. I just feel down about myself. My weight. (Progressing)    Start:  12/03/23    Expected End:  12/01/24    Goal Note Reviewed 03/09/24 I feel I'm doing good with that goal.  STG: To reduce symptoms of depression AEB reduction in PHQ9 scores over the next 12 weeks. (Progressing)   STG: To increase use of positive coping mechanisms AEB self-report over the next 12 weeks (Progressing)  Goal Note Reviewed 03/09/24 I've been doing good with coping.   ProgressTowards Goals: Progressing  Interventions: Motivational Interviewing and Solution Focused  Summary: Karen Beard is a 53 y.o. female who presents with a history of depression and schizophrenia. She appeared alert and oriented x5. She stated things are going well. She was excited to share she practiced the exercises provided last session. She noted her mother started PT and is also doing similar exercises. [name] noted progress on goals and areas for continued improvement. She was in agreement for maintenance appointment and plan for discharge. [name] took additional chair exercise handouts to keep exercising. She briefly shared information about her daughter who remains in foster care.  Therapist Response: Conducted session with Karen Beard. Began session with check-in/update since previous session. Utilized empathetic and reflective listening. Used open-ended questions to facilitate discussion and summarized [name]'s thoughts/feelings. Reviewed treatment plan with input from [facility] on current strengths, needs, and progress towards goals. Discussed plan for upcoming discharge. Provided new handouts for chair exercises. Scheduled additional appointment and concluded session.    Suicidal/Homicidal: No  Plan: Return again in 5 weeks.  Diagnosis: Undifferentiated schizophrenia (HCC)  Recurrent major depressive disorder, in partial remission  Collaboration of Care: Medication Management AEB chart review  Patient/Guardian was advised Release of Information must be obtained prior to any record release in order to collaborate their care with an outside provider. Patient/Guardian was advised if they have not already done so to contact the registration department to sign all necessary forms in order for us  to release information regarding their care.   Consent: Patient/Guardian gives verbal consent for treatment and assignment of benefits for services provided during this visit. Patient/Guardian expressed understanding and agreed to proceed.   [name], [facility] 03/09/2024

## 2024-03-22 ENCOUNTER — Other Ambulatory Visit: Payer: Self-pay | Admitting: Psychiatry

## 2024-04-02 ENCOUNTER — Other Ambulatory Visit: Payer: Self-pay | Admitting: Psychiatry

## 2024-04-02 DIAGNOSIS — F203 Undifferentiated schizophrenia: Secondary | ICD-10-CM

## 2024-04-05 NOTE — Progress Notes (Signed)
 [facility] MD/PA/NP OP Progress Note  04/12/2024 8:28 AM Karen Beard  MRN:  [id number]  Chief Complaint:  Chief Complaint  Patient presents with   Follow-up   HPI:  This is a follow-up appointment for schizophrenia, depression.  She states that she was diagnosed with pneumonia, although it has been getting better.  Her mother has been doing better, and she is still seeing a physical therapist.  She reports sadness related to the loss of her maternal uncle.  She did not go to the funeral as her mother was sick.  Although it has been hard, she denies much concern about this.  She states that her mood is just depressed.  However, her anxiety has been more manageable.  She was able to go to [employer].  She likes going there and bought fried chicken so that her mother and she can eat.  She is also concerned about her weight.  She would like to switch Abilify  or reduce the dose.  After discussing treatment options, she agrees with the plans as below.  She has fair sleep.  She has good appetite.  She denies SI, HI, hallucinations.   Wt Readings from Last 3 Encounters:  04/12/24 204 lb 6.4 oz (92.7 kg)  02/10/24 204 lb (92.5 kg)  12/29/23 204 lb 9.6 oz (92.8 kg)     89.8 kg (198 lb) 09/11/2022 10:45 AM ED         Wt Readings from Last 3 Encounters:  06/25/22 198 lb 3.2 oz (89.9 kg)  04/29/22 198 lb 6.4 oz (90 kg)  01/07/22 172 lb 12.8 oz (78.4 kg)    12/06/21 175 lb 9.6 oz (79.7 kg)  02/29/20 192 lb 8 oz (87.3 kg)  08/20/19 207 lb 12.8 oz (94.3 kg)    Visit Diagnosis:    ICD-10-CM   1. 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 Intellectual developmental disorder, mild  F70     3. MDD (major depressive disorder), recurrent episode, mild  F33.0     4. Drug-induced weight gain  R63.5    T50.905A       Past Psychiatric History: Please see initial evaluation for full details. I have reviewed the history. No updates at this time.     Past Medical History:  Past  Medical History:  Diagnosis Date   Abnormal uterine bleeding    Abnormal vaginal bleeding    Allergic genetic state    Anemia    Breast cancer (HCC) 01/26/2015   Oncoltype score: 3. T1a,No (isolated tumor cells_right breast lumpectomy with rad tx, ER/PR pos, her2 negative, INVASIVE MAMMARY CARCINOMA and DCIS of right breast   Carpal tunnel syndrome on both sides    Cysts of both ovaries    Depression    Edema of both legs    Fibroids    GERD (gastroesophageal reflux disease)    Hidradenitis    High triglycerides    Hypertension    RECENTLY TAKEN OFF OF MEDS PER MOM    Iron deficiency anemia    Learning disability    Migraine    daily   Mild intellectual disabilities    Personal history of radiation therapy    Pre-diabetes    Schizophrenia (HCC)    Seizures (HCC)    AS A CHILD-NONE SINCE    Past Surgical History:  Procedure Laterality Date   ABDOMINAL HYSTERECTOMY     AXILLARY HIDRADENITIS EXCISION     unc   AXILLARY SENTINEL NODE BIOPSY  Right 02/23/2015   Sentinel lymph node biopsy x4, one with isolated tumor cells.;  Dr Dellie, MD;  [facility]S;  ISOLATED TUMOR CELLS IN ONE LYMPH NODE (1/1).   BREAST BIOPSY Right 01/15/2016   stereo Dr Dessa, fat necrosis   BREAST DUCTAL SYSTEM EXCISION Bilateral 01/26/2015   Bilateral retroareolar ductal excision for bloody nipple drainage, DCIS identified on the right   BREAST LUMPECTOMY Right 02/06/2015   Invasive mammary carcinoma.  5 mm, DCIS at margin.  Surgeon: Louanne KANDICE Dellie, MD;  Location: [facility] ORS;  Service: General;  Laterality: Right;   CARPAL TUNNEL RELEASE Right 05/08/2016   Procedure: OPEN CARPAL TUNNEL RELEASE;  Surgeon: Norleen JINNY Maltos, MD;  Location: [facility];  Service: Orthopedics;  Laterality: Right;   COLONOSCOPY WITH PROPOFOL  N/A 06/17/2023   Procedure: COLONOSCOPY WITH PROPOFOL ;  Surgeon: Maryruth Ole DASEN, MD;  Location: [facility];  Service: Endoscopy;  Laterality: N/A;   CYSTOSCOPY N/A  11/24/2017   Procedure: CYSTOSCOPY;  Surgeon: Verdon Keen, MD;  Location: [facility] ORS;  Service: Gynecology;  Laterality: N/A;   EXCISION / BIOPSY BREAST / NIPPLE / DUCT Bilateral 01/26/2015   left breast duct ecstasis. right breast DCIS   INGUINAL HIDRADENITIS EXCISION     LAPAROSCOPIC BILATERAL SALPINGO OOPHERECTOMY Bilateral 11/24/2017   Procedure: LAPAROSCOPIC BILATERAL SALPINGO OOPHORECTOMY;  Surgeon: Verdon Keen, MD;  Location: [facility] ORS;  Service: Gynecology;  Laterality: Bilateral;   LAPAROSCOPIC HYSTERECTOMY N/A 11/24/2017   Procedure: HYSTERECTOMY TOTAL LAPAROSCOPIC;  Surgeon: Verdon Keen, MD;  Location: [facility] ORS;  Service: Gynecology;  Laterality: N/A;    Family Psychiatric History: Please see initial evaluation for full details. I have reviewed the history. No updates at this time.     Family History:  Family History  Problem Relation Age of Onset   Breast cancer Sister 58   Testicular cancer Father     Social History:  Social History   Socioeconomic History   Marital status: Single    Spouse name: Not on file   Number of children: Not on file   Years of education: Not on file   Highest education level: Not on file  Occupational History   Not on file  Tobacco Use   Smoking status: Never   Smokeless tobacco: Never  Vaping Use   Vaping status: Never Used  Substance and Sexual Activity   Alcohol use: No    Alcohol/week: 0.0 standard drinks of alcohol   Drug use: No   Sexual activity: Not on file  Other Topics Concern   Not on file  Social History Narrative   Not on file   Social Drivers of Health   Financial Resource Strain: Low Risk  (11/06/2023)   Overall Financial Resource Strain (CARDIA)    Difficulty of Paying Living Expenses: Not hard at all  Recent Concern: Financial Resource Strain - Medium Risk (10/07/2023)   Received from [facility]   Overall Financial Resource Strain (CARDIA)    Difficulty of Paying Living  Expenses: Somewhat hard  Food Insecurity: No Food Insecurity (11/06/2023)   Hunger Vital Sign    Worried About Running Out of Food in the Last Year: Never true    Ran Out of Food in the Last Year: Never true  Transportation Needs: No Transportation Needs (11/06/2023)   PRAPARE - Administrator, Civil Service (Medical): No    Lack of Transportation (Non-Medical): No  Physical Activity: Inactive (11/06/2023)   Exercise Vital Sign    Days of  Exercise per Week: 0 days    Minutes of Exercise per Session: 0 min  Stress: Stress Concern Present (11/06/2023)   [employer] of Occupational Health - Occupational Stress Questionnaire    Feeling of Stress : To some extent  Social Connections: Socially Isolated (11/06/2023)   Social Connection and Isolation Panel    Frequency of Communication with Friends and Family: More than three times a week    Frequency of Social Gatherings with Friends and Family: More than three times a week    Attends Religious Services: Never    Database Administrator or Organizations: No    Attends Banker Meetings: Never    Marital Status: Divorced    Allergies:  Allergies  Allergen Reactions   Sumatriptan Shortness Of Breath   Cephalexin Nausea And Vomiting   Chocolate Swelling   Corn Oil Other (See Comments)    By test. Pt has eaten without issues.   Peanut Oil Swelling and Other (See Comments)    throat   Peanut-Containing Drug Products Swelling   Phenobarbital Other (See Comments)    Hyperactivity, aggitation   Vilazodone Hcl Nausea Only   Duloxetine Hcl Anxiety and Other (See Comments)    agitation   Latex Rash and Other (See Comments)    Bandaids, gloves(when worn)   Sulfa Antibiotics Rash   Tape Rash    Metabolic Disorder Labs: Lab Results  Component Value Date   HGBA1C 5.1 06/12/2018   MPG 99.67 06/12/2018   MPG 100 03/13/2016   No results found for: PROLACTIN Lab Results  Component Value Date   CHOL 175  06/12/2018   TRIG 270 (H) 06/12/2018   HDL 31 (L) 06/12/2018   CHOLHDL 5.6 06/12/2018   VLDL 54 (H) 06/12/2018   LDLCALC 90 06/12/2018   LDLCALC 109 (H) 03/13/2016   Lab Results  Component Value Date   TSH 1.721 06/12/2018   TSH 1.864 03/13/2016    Therapeutic Level Labs: No results found for: LITHIUM No results found for: VALPROATE No results found for: CBMZ  Current Medications: Current Outpatient Medications  Medication Sig Dispense Refill   albuterol (VENTOLIN HFA) 108 (90 Base) MCG/ACT inhaler Inhale 2 puffs into the lungs every 6 (six) hours as needed.     ARIPiprazole  (ABILIFY ) 15 MG tablet Take 1 tablet (15 mg total) by mouth at bedtime. 30 tablet 5   aspirin -acetaminophen -caffeine  (EXCEDRIN  MIGRAINE) 250-250-65 MG per tablet Take 1 tablet by mouth every 6 (six) hours as needed for headache.      cetirizine (ZYRTEC) 10 MG tablet Take 10 mg by mouth daily as needed for allergies.      ergocalciferol (VITAMIN D2) 1.25 MG (50000 UT) capsule Take 50,000 Units by mouth once a week.     hydrochlorothiazide  (MICROZIDE ) 12.5 MG capsule TAKE 1 CAPSULE BY MOUTH ONCE DAILY. **TAKE WITH LOSARTAN  50 MG TABLET**     Icosapent Ethyl (VASCEPA) 1 g CAPS Take 2 g by mouth daily with lunch.     losartan  (COZAAR ) 50 MG tablet Take 1 tablet by mouth daily.     metFORMIN  (GLUCOPHAGE ) 500 MG tablet TAKE ONE TABLET (500 MG TOTAL) BY MOUTH TWO TIMES DAILY WITH A MEAL. 180 tablet 0   omeprazole (PRILOSEC) 20 MG capsule Take 1 capsule by mouth daily.     rizatriptan (MAXALT-MLT) 10 MG disintegrating tablet      sertraline  (ZOLOFT ) 50 MG tablet Take 1 tablet (50 mg total) by mouth at bedtime. 30 tablet 3  zonisamide (ZONEGRAN) 100 MG capsule Take 3 capsules by mouth daily.     No current facility-administered medications for this visit.     Musculoskeletal: Strength & Muscle Tone: within normal limits Gait & Station: normal Patient leans: N/A  Psychiatric Specialty Exam: Review of  Systems  Blood pressure 135/88, pulse 80, temperature 97.7 F (36.5 C), temperature source Temporal, height 5' 2.5 (1.588 m), weight 204 lb 6.4 oz (92.7 kg), last menstrual period 11/10/2017.Body mass index is 36.79 kg/m.  General Appearance: Well Groomed  Eye Contact:  Good  Speech:  Clear and Coherent  Volume:  Normal  Mood:  Depressed  Affect:  Appropriate, Congruent, and Restricted  Thought Process:  Coherent  Orientation:  Full (Time, Place, and Person)  Thought Content: Logical   Suicidal Thoughts:  No  Homicidal Thoughts:  No  Memory:  Immediate;   Good  Judgement:  Good  Insight:  Good  Psychomotor Activity:  Normal  Concentration:  Concentration: Good and Attention Span: Good  Recall:  Good  Fund of Knowledge: Good  Language: Good  Akathisia:  No  Handed:  Right  AIMS (if indicated): not done  Assets:  Communication Skills Desire for Improvement  ADL's:  Intact  Cognition: WNL  Sleep:  Fair   Screenings: AUDIT    Flowsheet Row Admission (Discharged) from 06/12/2018 in [facility] Admission (Discharged) from 03/12/2016 in [facility]INE  Alcohol Use Disorder Identification Test Final Score (AUDIT) 0 0   GAD-7    Flowsheet Row Counselor from 11/06/2023 in [facility] Office Visit from 05/12/2023 in [facility] Office Visit from 06/25/2022 in [facility] Office Visit from 04/29/2022 in [facility] Office Visit from 01/07/2022 in [facility]  Total GAD-7 Score 3 7 4 9 3    PHQ2-9    Flowsheet Row Counselor from 03/09/2024 in [facility] Counselor from 11/06/2023 in [facility] Office Visit from 05/12/2023 in [facility] Office Visit from 06/25/2022 in [facility] Office Visit from 04/29/2022 in [facility]  PHQ-2 Total Score 2 2 1 2 1   PHQ-9 Total Score 7 4 -- 4 6   Flowsheet Row Counselor from 11/06/2023 in [facility]  C-SSRS RISK CATEGORY No Risk     Assessment and Plan:  Karen Beard is a 53 y.o. 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   1. Undifferentiated schizophrenia (HCC) 2. Intellectual developmental disorder, mild History: transferred from [facility]. history of AH, paranoia. Admitted twice for psychosis, last in 2019.  No aggression in the past.previously on Abilify  maintenance 300, clonazepam 0.5, benadryl 50 daily, haldol 10 mg daily.   Although she denies any psychotic symptoms, she is concerned about her current weight pain. Although there is still room to lose weight through diet modification and exercise, she prefers to switch to an alternative medication.  To ensure the adherence, will switch from Abilify  to Latuda.  Discussed potential metabolic side effect, EPS and QTc prolongation.  She agreed to share this change with her sister for extra support if needed.   3. MDD (  major depressive disorder), recurrent episode, mild Although she continues to report depressed mood, she has been a little more active such as going to [employer] and the grocery shopping.  She reports overall improvement in her anxiety.  Will continue sertraline  to target depression.  She will continue to see Ms. Veva for therapy.   4. Drug-induced weight gain - insurance does not cover her seeing a nutritionist    Will continue the current dose of  metformin  for weight gain associated with antipsychotic use.    # Insomnia - AHI 9 on HST 2024 Overall improving.  Will continue to monitor and assess as needed.       Last checked  EKG HR 73, QTc4109msec 07/2022  Lipid panels LDL 117 08/2022  HbA1c 6.8 [id number]      Plan Continue Abilify  15 mg daily- Addendum Continue sertraline  50 mg at night  Continue metformin  500 mg twice a day  Next appointment: 12/15 at 8 30, IP     Addendum After the visit, when this writer tried to prescribe Latuda, concern of allergy is listed.  Called preferred phone number, and left a voice message. Her sister, Karen Beard answers the phone.  She is very concerned about medication change.  Karen Beard has tried several medications in the past, and it took time to be on Abilify , which has been helping her hallucinations.  She does not necessarily think Abilify  is causing much issues with weight, given Karen Beard has been not active. She expressed understanding that this clinical research associate communicates with Karen Beard about the concern again.   [employer] (home number).  She acknowledges the above conversation.  She expressed understanding to stay on the Abilify  at this time.  She also agrees to work on exercise and diet modification.     The patient demonstrates the following risk factors for suicide: Chronic risk factors for suicide include: psychiatric disorder of schizophrenia . Acute risk factors for suicide include: N/A. Protective factors for this patient include: positive social support. Considering these factors, the overall suicide risk at this point appears to be low. Patient is appropriate for outpatient follow up.  A total of 42 minutes was spent on the following activities during the encounter date, which includes but is not limited to: preparing to see the patient (e.g., reviewing tests and records), obtaining and/or reviewing separately obtained history, performing a medically necessary examination or evaluation,  counseling and educating the patient, family, or caregiver, ordering medications, tests, or procedures, referring and communicating with other healthcare professionals (when not reported separately), documenting clinical information in the electronic or paper health record, independently interpreting test or lab results and communicating these results to the family or caregiver, and coordinating care (when not reported separately).     Collaboration of Care: Collaboration of Care: Other reviewed notes in Epic, spoke with her sister. Karen Beard  Patient/Guardian was advised Release of Information must be obtained prior to any record release in order to collaborate their care with an outside provider. Patient/Guardian was advised if they have not already done so to contact the registration department to sign all necessary forms in order for us  to release information regarding their care.   Consent: Patient/Guardian gives verbal consent for treatment and assignment of benefits for services provided during this visit. Patient/Guardian expressed understanding and agreed to proceed.    Katheren Sleet, MD 04/12/2024, 8:28 AM

## 2024-04-12 ENCOUNTER — Encounter: Payer: Self-pay | Admitting: Psychiatry

## 2024-04-12 ENCOUNTER — Ambulatory Visit: Admitting: Psychiatry

## 2024-04-12 ENCOUNTER — Telehealth: Payer: Self-pay

## 2024-04-12 ENCOUNTER — Other Ambulatory Visit: Payer: Self-pay

## 2024-04-12 VITALS — BP 135/88 | HR 80 | Temp 97.7°F | Ht 62.5 in | Wt 204.4 lb

## 2024-04-12 DIAGNOSIS — F330 Major depressive disorder, recurrent, mild: Secondary | ICD-10-CM | POA: Diagnosis not present

## 2024-04-12 DIAGNOSIS — F203 Undifferentiated schizophrenia: Secondary | ICD-10-CM

## 2024-04-12 DIAGNOSIS — T50905A Adverse effect of unspecified drugs, medicaments and biological substances, initial encounter: Secondary | ICD-10-CM

## 2024-04-12 DIAGNOSIS — F70 Mild intellectual disabilities: Secondary | ICD-10-CM | POA: Diagnosis not present

## 2024-04-12 DIAGNOSIS — R635 Abnormal weight gain: Secondary | ICD-10-CM

## 2024-04-12 MED ORDER — ARIPIPRAZOLE 15 MG PO TABS: 15.0000 mg | ORAL_TABLET | Freq: Every day | ORAL | 5 refills | Status: AC

## 2024-04-12 NOTE — Patient Instructions (Addendum)
 Continue Abilify  15 mg daily Continue sertraline  50 mg at night  Continue metformin  500 mg twice a day  Next appointment: 12/15 at 8 30

## 2024-04-12 NOTE — Telephone Encounter (Signed)
 Discussed with her. Details are in the note.

## 2024-04-12 NOTE — Telephone Encounter (Signed)
 Medication management - Call message from pt's sister, stating Dr. Vickey had left her a message this morning after seeing her sister and wanted to discuss a medication. Collateral requested Dr. Vickey call her back again to discuss at [phone].

## 2024-04-15 ENCOUNTER — Ambulatory Visit (INDEPENDENT_AMBULATORY_CARE_PROVIDER_SITE_OTHER): Admitting: Professional Counselor

## 2024-04-15 DIAGNOSIS — F203 Undifferentiated schizophrenia: Secondary | ICD-10-CM | POA: Diagnosis not present

## 2024-04-15 NOTE — Progress Notes (Signed)
  THERAPIST PROGRESS NOTE  Session Time: 11:00 AM - 11:30 AM  Participation Level: Active  Behavioral Response: Well Groomed, Alert, Dysphoric  Type of Therapy: Individual Therapy  Treatment Goals addressed: Active OP Depression  LTG: I won't be depressed. I just feel down about myself. My weight. (Progressing)                Start:  12/03/23    Expected End:  12/01/24    Goal Note Reviewed 03/09/24 I feel I'm doing good with that goal.   STG: To reduce symptoms of depression AEB reduction in PHQ9 scores over the next 12 weeks. (Progressing)    STG: To increase use of positive coping mechanisms AEB self-report over the next 12 weeks (Progressing)  Goal Note Reviewed 03/09/24 I've been doing good with coping.   ProgressTowards Goals: Progressing  Interventions: Motivational Interviewing, Solution Focused, and Supportive  Summary: Tanique Matney is a 53 y.o. female who presents with undifferentiated schizophrenia. She appeared somber but oriented x5. She reported she's been sick with pneumonia. She was able to do some exercises and take a walk before getting sick. She still hasn't worked on her puzzle. She reported it brings up thoughts of her father, which makes her sad. Rylea discussed her grief. She shared hopes that he would up the ground and reported he did this once before. She stated this is something Christians can do, by dying and coming back to life. Sakeena engaged in discussion about ways to memorialize lost ones to help combat her grief. She seemed ambivalent about ways to reduce depression, but was in agreement to try some exercises like listening to music.   Therapist Response: Conducted session with [name]. Began session with check-in/update since previous session. Utilized empathetic and reflective listening. Used open-ended questions to facilitate discussion and summarized Stephene's thoughts/feelings. Explored grief and ways to memorialize lost ones. Shared  tips and exercises to help combat depression, such as light therapy, distraction activities like coloring, and listening to music. Scheduled additional appointment and concluded session.   Suicidal/Homicidal: No  Plan: Return again in 3 weeks.  Diagnosis: Undifferentiated schizophrenia (HCC)  Collaboration of Care: Medication Management AEB chart review  Patient/Guardian was advised Release of Information must be obtained prior to any record release in order to collaborate their care with an outside provider. Patient/Guardian was advised if they have not already done so to contact the registration department to sign all necessary forms in order for us  to release information regarding their care.   Consent: Patient/Guardian gives verbal consent for treatment and assignment of benefits for services provided during this visit. Patient/Guardian expressed understanding and agreed to proceed.   [name], [facility] 04/15/2024

## 2024-05-05 ENCOUNTER — Ambulatory Visit (INDEPENDENT_AMBULATORY_CARE_PROVIDER_SITE_OTHER): Admitting: Professional Counselor

## 2024-05-05 DIAGNOSIS — F203 Undifferentiated schizophrenia: Secondary | ICD-10-CM

## 2024-05-05 NOTE — Progress Notes (Signed)
  THERAPIST PROGRESS NOTE  Session Time: 11:00 AM  Participation Level: Minimal  Type of Therapy: Individual Therapy  Treatment Goals addressed: Active OP Depression  LTG: I won't be depressed. I just feel down about myself. My weight. (Progressing)                Start:  12/03/23    Expected End:  12/01/24    Goal Note Reviewed 03/09/24 I feel I'm doing good with that goal.   STG: To reduce symptoms of depression AEB reduction in PHQ9 scores over the next 12 weeks. (Progressing)    STG: To increase use of positive coping mechanisms AEB self-report over the next 12 weeks (Progressing)  Goal Note Reviewed 03/09/24 I've been doing good with coping.   Interventions: N/A  Summary: Karen Beard is a 53 y.o. female who presents with a history of schizophrenia. She appeared alert and oriented x5. She reported she is not feeling well. She attempted to engage in discussion but was clearly in pain. She was in agreement to reschedule for a later date.   Therapist Response: Attempted to conduct session. Began session with check-in. Utilized empathetic and reflective listening. Due to Karen Beard's health/physical illness, we decided to reschedule session.  Suicidal/Homicidal: No  Plan: Return again in 2 weeks.  Diagnosis: Undifferentiated schizophrenia (HCC)  Collaboration of Care: Medication Management AEB chart review  Patient/Guardian was advised Release of Information must be obtained prior to any record release in order to collaborate their care with an outside provider. Patient/Guardian was advised if they have not already done so to contact the registration department to sign all necessary forms in order for us  to release information regarding their care.   Consent: Patient/Guardian gives verbal consent for treatment and assignment of benefits for services provided during this visit. Patient/Guardian expressed understanding and agreed to proceed.   Karen Beard,  [facility] 05/05/2024

## 2024-05-19 ENCOUNTER — Ambulatory Visit: Admitting: Professional Counselor

## 2024-05-20 ENCOUNTER — Ambulatory Visit: Admitting: Professional Counselor

## 2024-05-20 DIAGNOSIS — F203 Undifferentiated schizophrenia: Secondary | ICD-10-CM

## 2024-05-20 NOTE — Progress Notes (Signed)
  THERAPIST PROGRESS NOTE  Session Time: 10:01 AM - 10:25 AM  Participation Level: Active  Behavioral Response: Well Groomed, Alert, Dysphoric  Type of Therapy: Individual Therapy  Treatment Goals addressed:  Active OP Depression  LTG: I won't be depressed. I just feel down about myself. My weight. (Progressing)                Start:  12/03/23    Expected End:  12/01/24    Goal Note Reviewed 03/09/24 I feel I'm doing good with that goal.   STG: To reduce symptoms of depression AEB reduction in PHQ9 scores over the next 12 weeks. (Progressing)    STG: To increase use of positive coping mechanisms AEB self-report over the next 12 weeks (Progressing)  Goal Note Reviewed 03/09/24 I've been doing good with coping.     ProgressTowards Goals: Progressing  Interventions: Motivational Interviewing, Solution Focused, and Supportive  Summary: Karen Beard is a 52 y.o. female who presents with a history of depression and schizophrenia. She appeared alert and oriented x5. She stated her sister bought them a turkey breast and she cooked it for her and her mom on Thanksgiving. [name] noted she is still having some health issues - cough and migraines. She will talk to her doctor about magnesium  supplements. [name] reported she would like to volunteer. She was receptive to resources to help assist with finding volunteer opportunities.   Therapist Response: Conducted session with Karen Beard. Began session with check-in/update since previous session. Utilized empathetic and reflective listening. Used open-ended questions to facilitate discussion and summarized Karen Beard's thoughts/feelings. Suggested Karen Beard discuss magnesium  supplements with her doctor to help reduce migraines. Provided volunteer opportunities to help Karen Beard be more productive. Scheduled additional appointment and concluded session.   Suicidal/Homicidal: No  Plan: Return again in 5 weeks.  Diagnosis: Undifferentiated  schizophrenia (HCC)  Collaboration of Care: Medication Management AEB chart review  Patient/Guardian was advised Release of Information must be obtained prior to any record release in order to collaborate their care with an outside provider. Patient/Guardian was advised if they have not already done so to contact the registration department to sign all necessary forms in order for us  to release information regarding their care.   Consent: Patient/Guardian gives verbal consent for treatment and assignment of benefits for services provided during this visit. Patient/Guardian expressed understanding and agreed to proceed.   Karen Beard, [facility] 05/20/2024

## 2024-05-24 NOTE — Progress Notes (Signed)
 [facility] MD/PA/NP OP Progress Note  05/31/2024 9:04 AM Karen Beard  MRN:  [id number]  Chief Complaint:  Chief Complaint  Patient presents with   Follow-up   HPI:  This is a follow-up appointment for schizophrenia, insomnia.  She states that she had pneumonia and has been sick in the last few weeks.  She has not been able to do much exercise due to this sickness.  She also admits not be able to do much even prior to being sick.  She also states that she has been busy taking care of her mother.  However, she is willing to try working on things just for a few minutes after her physical condition improves.  Her mother has been doing good.  She believes her depression is better.  She denies much anxiety.  She states that she went to [employer] and it did not bother her.  She denies change in appetite.  She sleeps up to 12 hours.  She denies SI, HI, hallucinations.  She agrees with the plans as outlined below.   Wt Readings from Last 3 Encounters:  05/31/24 203 lb 12.8 oz (92.4 kg)  04/12/24 204 lb 6.4 oz (92.7 kg)  02/10/24 204 lb (92.5 kg)     02/10/24 204 lb (92.5 kg)  12/29/23 204 lb 9.6 oz (92.8 kg)  10/27/23 201 lb 3.2 oz (91.3 kg)     89.8 kg (198 lb) 09/11/2022 10:45 AM ED         Wt Readings from Last 3 Encounters:  06/25/22 198 lb 3.2 oz (89.9 kg)  04/29/22 198 lb 6.4 oz (90 kg)  01/07/22 172 lb 12.8 oz (78.4 kg)    12/06/21 175 lb 9.6 oz (79.7 kg)  02/29/20 192 lb 8 oz (87.3 kg)  08/20/19 207 lb 12.8 oz (94.3 kg)    Visit Diagnosis:    ICD-10-CM   1. 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 Intellectual developmental disorder, mild  F70     3. MDD (major depressive disorder), recurrent episode, mild  F33.0     4. Drug-induced weight gain  R63.5    T50.905A       Past Psychiatric History: Please see initial evaluation for full details. I have reviewed the history. No updates at this time.     Past Medical History:  Past Medical History:   Diagnosis Date   Abnormal uterine bleeding    Abnormal vaginal bleeding    Allergic genetic state    Anemia    Breast cancer (HCC) 01/26/2015   Oncoltype score: 3. T1a,No (isolated tumor cells_right breast lumpectomy with rad tx, ER/PR pos, her2 negative, INVASIVE MAMMARY CARCINOMA and DCIS of right breast   Carpal tunnel syndrome on both sides    Cysts of both ovaries    Depression    Edema of both legs    Fibroids    GERD (gastroesophageal reflux disease)    Hidradenitis    High triglycerides    Hypertension    RECENTLY TAKEN OFF OF MEDS PER MOM    Iron deficiency anemia    Learning disability    Migraine    daily   Mild intellectual disabilities    Personal history of radiation therapy    Pre-diabetes    Schizophrenia (HCC)    Seizures (HCC)    AS A CHILD-NONE SINCE    Past Surgical History:  Procedure Laterality Date   ABDOMINAL HYSTERECTOMY     AXILLARY HIDRADENITIS EXCISION  unc   AXILLARY SENTINEL NODE BIOPSY Right 02/23/2015   Sentinel lymph node biopsy x4, one with isolated tumor cells.;  Dr Dellie, MD;  [facility];  ISOLATED TUMOR CELLS IN ONE LYMPH NODE (1/1).   BREAST BIOPSY Right 01/15/2016   stereo Dr Dessa, fat necrosis   BREAST DUCTAL SYSTEM EXCISION Bilateral 01/26/2015   Bilateral retroareolar ductal excision for bloody nipple drainage, DCIS identified on the right   BREAST LUMPECTOMY Right 02/06/2015   Invasive mammary carcinoma.  5 mm, DCIS at margin.  Surgeon: Louanne KANDICE Dellie, MD;  Location: [facility];  Service: General;  Laterality: Right;   CARPAL TUNNEL RELEASE Right 05/08/2016   Procedure: OPEN CARPAL TUNNEL RELEASE;  Surgeon: Norleen JINNY Maltos, MD;  Location: [facility];  Service: Orthopedics;  Laterality: Right;   COLONOSCOPY WITH PROPOFOL  N/A 06/17/2023   Procedure: COLONOSCOPY WITH PROPOFOL ;  Surgeon: Maryruth Ole DASEN, MD;  Location: [facility];  Service: Endoscopy;  Laterality: N/A;   CYSTOSCOPY N/A 11/24/2017    Procedure: CYSTOSCOPY;  Surgeon: Verdon Keen, MD;  Location: [facility];  Service: Gynecology;  Laterality: N/A;   EXCISION / BIOPSY BREAST / NIPPLE / DUCT Bilateral 01/26/2015   left breast duct ecstasis. right breast DCIS   INGUINAL HIDRADENITIS EXCISION     LAPAROSCOPIC BILATERAL SALPINGO OOPHERECTOMY Bilateral 11/24/2017   Procedure: LAPAROSCOPIC BILATERAL SALPINGO OOPHORECTOMY;  Surgeon: Verdon Keen, MD;  Location: [facility];  Service: Gynecology;  Laterality: Bilateral;   LAPAROSCOPIC HYSTERECTOMY N/A 11/24/2017   Procedure: HYSTERECTOMY TOTAL LAPAROSCOPIC;  Surgeon: Verdon Keen, MD;  Location: [facility];  Service: Gynecology;  Laterality: N/A;    Family Psychiatric History: Please see initial evaluation for full details. I have reviewed the history. No updates at this time.     Family History:  Family History  Problem Relation Age of Onset   Breast cancer Sister 34   Testicular cancer Father     Social History:  Social History   Socioeconomic History   Marital status: Single    Spouse name: Not on file   Number of children: Not on file   Years of education: Not on file   Highest education level: Not on file  Occupational History   Not on file  Tobacco Use   Smoking status: Never   Smokeless tobacco: Never  Vaping Use   Vaping status: Never Used  Substance and Sexual Activity   Alcohol use: No    Alcohol/week: 0.0 standard drinks of alcohol   Drug use: No   Sexual activity: Not on file    Comment: not asked if sexually active  Other Topics Concern   Not on file  Social History Narrative   Not on file   Social Drivers of Health   Tobacco Use: Low Risk (05/31/2024)   Patient History    Smoking Tobacco Use: Never    Smokeless Tobacco Use: Never    Passive Exposure: Not on file  Financial Resource Strain: Low Risk (11/06/2023)   Overall Financial Resource Strain (CARDIA)    Difficulty of Paying Living Expenses: Not hard at all  Recent Concern:  Financial Resource Strain - Medium Risk (10/07/2023)   [facility] from [facility]   Overall Financial Resource Strain (CARDIA)    Difficulty of Paying Living Expenses: Somewhat hard  Food Insecurity: No Food Insecurity (11/06/2023)   Hunger Vital Sign    Worried About Running Out of Food in the Last Year: Never true    Ran Out of Food in the  Last Year: Never true  Transportation Needs: No Transportation Needs (11/06/2023)   PRAPARE - Administrator, Civil Service (Medical): No    Lack of Transportation (Non-Medical): No  Physical Activity: Inactive (11/06/2023)   Exercise Vital Sign    Days of Exercise per Week: 0 days    Minutes of Exercise per Session: 0 min  Stress: Stress Concern Present (11/06/2023)   [employer] of Occupational Health - Occupational Stress Questionnaire    Feeling of Stress : To some extent  Social Connections: Socially Isolated (11/06/2023)   Social Connection and Isolation Panel    Frequency of Communication with Friends and Family: More than three times a week    Frequency of Social Gatherings with Friends and Family: More than three times a week    Attends Religious Services: Never    Database Administrator or Organizations: No    Attends Banker Meetings: Never    Marital Status: Divorced  Depression (PHQ2-9): Medium Risk (03/09/2024)   Depression (PHQ2-9)    PHQ-2 Score: 7  Alcohol Screen: Low Risk (11/06/2023)   Alcohol Screen    Last Alcohol Screening Score (AUDIT): 0  Housing: Low Risk (11/06/2023)   Housing Stability Vital Sign    Unable to Pay for Housing in the Last Year: No    Number of Times Moved in the Last Year: 0    Homeless in the Last Year: No  Utilities: Not At Risk (11/06/2023)   AHC Utilities    Threatened with loss of utilities: No  Health Literacy: Adequate Health Literacy (11/06/2023)   B1300 Health Literacy    Frequency of need for help with medical instructions: Never    Allergies:   Allergies  Allergen Reactions   Sumatriptan Shortness Of Breath   Cephalexin Nausea And Vomiting   Chocolate Swelling   Corn Oil Other (See Comments)    By test. Pt has eaten without issues.   Peanut Oil Swelling and Other (See Comments)    throat   Peanut-Containing Drug Products Swelling   Phenobarbital Other (See Comments)    Hyperactivity, aggitation   Vilazodone Hcl Nausea Only   Duloxetine Hcl Anxiety and Other (See Comments)    agitation   Latex Rash and Other (See Comments)    Bandaids, gloves(when worn)   Sulfa Antibiotics Rash   Tape Rash    Metabolic Disorder Labs: Lab Results  Component Value Date   HGBA1C 5.1 06/12/2018   MPG 99.67 06/12/2018   MPG 100 03/13/2016   No results found for: PROLACTIN Lab Results  Component Value Date   CHOL 175 06/12/2018   TRIG 270 (H) 06/12/2018   HDL 31 (L) 06/12/2018   CHOLHDL 5.6 06/12/2018   VLDL 54 (H) 06/12/2018   LDLCALC 90 06/12/2018   LDLCALC 109 (H) 03/13/2016   Lab Results  Component Value Date   TSH 1.721 06/12/2018   TSH 1.864 03/13/2016    Therapeutic Level Labs: No results found for: LITHIUM No results found for: VALPROATE No results found for: CBMZ  Current Medications: Current Outpatient Medications  Medication Sig Dispense Refill   albuterol (VENTOLIN HFA) 108 (90 Base) MCG/ACT inhaler Inhale 2 puffs into the lungs every 6 (six) hours as needed.     ARIPiprazole  (ABILIFY ) 15 MG tablet Take 1 tablet (15 mg total) by mouth at bedtime. 30 tablet 5   aspirin -acetaminophen -caffeine  (EXCEDRIN  MIGRAINE) 250-250-65 MG per tablet Take 1 tablet by mouth every 6 (six) hours as needed for headache.  cetirizine (ZYRTEC) 10 MG tablet Take 10 mg by mouth daily as needed for allergies.      ergocalciferol (VITAMIN D2) 1.25 MG (50000 UT) capsule Take 50,000 Units by mouth once a week.     hydrochlorothiazide  (MICROZIDE ) 12.5 MG capsule TAKE 1 CAPSULE BY MOUTH ONCE DAILY. **TAKE WITH LOSARTAN  50 MG  TABLET**     Icosapent Ethyl (VASCEPA) 1 g CAPS Take 2 g by mouth daily with lunch.     losartan  (COZAAR ) 50 MG tablet Take 1 tablet by mouth daily.     [START ON 06/20/2024] metFORMIN  (GLUCOPHAGE ) 500 MG tablet Take 1 tablet (500 mg total) by mouth in the morning and at bedtime. 180 tablet 0   omeprazole (PRILOSEC) 20 MG capsule Take 1 capsule by mouth daily.     rizatriptan (MAXALT-MLT) 10 MG disintegrating tablet      sertraline  (ZOLOFT ) 50 MG tablet Take 1 tablet (50 mg total) by mouth at bedtime. 30 tablet 3   zonisamide (ZONEGRAN) 100 MG capsule Take 3 capsules by mouth daily.     No current facility-administered medications for this visit.     Musculoskeletal: Strength & Muscle Tone: N/A Gait & Station: N/A Patient leans: N/A  Psychiatric Specialty Exam: Review of Systems  Psychiatric/Behavioral:  Negative for agitation, behavioral problems, confusion, decreased concentration, dysphoric mood, hallucinations, self-injury, sleep disturbance and suicidal ideas. The patient is not nervous/anxious and is not hyperactive.   All other systems reviewed and are negative.   Blood pressure (!) 132/93, pulse 97, temperature (!) 97.3 F (36.3 C), temperature source Temporal, height 5' 2.5 (1.588 m), weight 203 lb 12.8 oz (92.4 kg), last menstrual period 11/10/2017.Body mass index is 36.68 kg/m.  General Appearance: Well Groomed  Eye Contact:  Good  Speech:  Clear and Coherent  Volume:  Normal  Mood:  better  Affect:  Appropriate, Congruent, and calm, slightly restricted  Thought Process:  Coherent  Orientation:  Full (Time, Place, and Person)  Thought Content: Logical   Suicidal Thoughts:  No  Homicidal Thoughts:  No  Memory:  Immediate;   Good  Judgement:  Good  Insight:  Good  Psychomotor Activity:  Normal, Normal tone, no rigidity, no resting/postural tremors, no tardive dyskinesia    Concentration:  Concentration: Good and Attention Span: Good  Recall:  Good  Fund of  Knowledge: Good  Language: Good  Akathisia:  No  Handed:  Right  AIMS (if indicated): 0   Assets:  Communication Skills Desire for Improvement  ADL's:  Intact  Cognition: WNL  Sleep:  Fair   Screenings: AUDIT    Flowsheet Row Admission (Discharged) from 06/12/2018 in [facility] Admission (Discharged) from 03/12/2016 in [facility]  Alcohol Use Disorder Identification Test Final Score (AUDIT) 0 0   GAD-7    Flowsheet Row Counselor from 11/06/2023 in [facility] Office Visit from 05/12/2023 in [facility] Office Visit from 06/25/2022 in [facility] Office Visit from 04/29/2022 in [facility] Office Visit from 01/07/2022 in [facility]  Total GAD-7 Score 3 7 4 9 3    PHQ2-9    Flowsheet Row Counselor from 03/09/2024 in [facility] Counselor from 11/06/2023 in [facility] Office Visit from 05/12/2023 in [facility] Office Visit from 06/25/2022 in [facility] Office Visit  from 04/29/2022 in [facility]  PHQ-2 Total Score 2 2 1 2 1   PHQ-9 Total Score 7 4 -- 4 6   Flowsheet Row Counselor from 11/06/2023 in [facility]  C-SSRS RISK CATEGORY No Risk     Assessment and Plan:  Karen Beard is a 53 y.o. 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    1. Undifferentiated schizophrenia  (HCC) 2. Intellectual developmental disorder, mild History: transferred from [facility]. history of AH, paranoia. Admitted twice for psychosis, last in 2019.  No aggression in the past.previously on Abilify  maintenance 300, clonazepam 0.5, benadryl 50 daily, haldol 10 mg daily.   She denies any psychotic symptoms since her last visit.  Will continue the current dose of Abilify  to target schizophrenia.   3. MDD (major depressive disorder), recurrent episode, mild She reports overall improvement in her mood symptoms since the previous visit.  Although she still has challenges in behavioral activation, she has been working on it through therapy.  Will continue current dose of sertraline  to target depression.   4. Drug-induced weight gain - insurance does not cover her seeing a nutritionist    Will continue current dose of metformin  for weight gain associated with antipsychotic use. Noted that she was successful in weight loss last year while being on the same dose of Abilify .  Provided psychoeducation regarding behavioral activation.    # Insomnia - AHI 9 on HST 2024 Overall improving.. she agrees to work on behavioral activation for weight loss, which will be beneficial for insomnia.         Last checked  EKG HR 73, QTc432msec 07/2022  Lipid panels LDL 117 08/2022  HbA1c 6.1 01/7973      Plan Continue Abilify  15 mg at night   Continue sertraline  50 mg at night  Continue metformin  500 mg twice a day  Next appointment: 2/9 at 8:30, IP   The patient demonstrates the following risk factors for suicide: Chronic risk factors for suicide include: psychiatric disorder of schizophrenia . Acute risk factors for suicide include: N/A. Protective factors for this patient include: positive social support. Considering these factors, the overall suicide risk at this point appears to be low. Patient is appropriate for outpatient follow up.\    Collaboration of Care: Collaboration of Care: Other reviewed  notes in Epic  Patient/Guardian was advised Release of Information must be obtained prior to any record release in order to collaborate their care with an outside provider. Patient/Guardian was advised if they have not already done so to contact the registration department to sign all necessary forms in order for us  to release information regarding their care.   Consent: Patient/Guardian gives verbal consent for treatment and assignment of benefits for services provided during this visit. Patient/Guardian expressed understanding and agreed to proceed.    Katheren Sleet, MD 05/31/2024, 9:04 AM

## 2024-05-31 ENCOUNTER — Encounter: Payer: Self-pay | Admitting: Psychiatry

## 2024-05-31 ENCOUNTER — Ambulatory Visit: Admitting: Psychiatry

## 2024-05-31 ENCOUNTER — Other Ambulatory Visit: Payer: Self-pay

## 2024-05-31 VITALS — BP 132/93 | HR 97 | Temp 97.3°F | Ht 62.5 in | Wt 203.8 lb

## 2024-05-31 DIAGNOSIS — F70 Mild intellectual disabilities: Secondary | ICD-10-CM

## 2024-05-31 DIAGNOSIS — R635 Abnormal weight gain: Secondary | ICD-10-CM

## 2024-05-31 DIAGNOSIS — F203 Undifferentiated schizophrenia: Secondary | ICD-10-CM

## 2024-05-31 DIAGNOSIS — T50905A Adverse effect of unspecified drugs, medicaments and biological substances, initial encounter: Secondary | ICD-10-CM

## 2024-05-31 DIAGNOSIS — F330 Major depressive disorder, recurrent, mild: Secondary | ICD-10-CM

## 2024-05-31 MED ORDER — METFORMIN HCL 500 MG PO TABS: 500.0000 mg | ORAL_TABLET | Freq: Two times a day (BID) | ORAL | 0 refills | Status: AC

## 2024-05-31 NOTE — Patient Instructions (Signed)
 Continue Abilify  15 mg at night   Continue sertraline  50 mg at night  Continue metformin  500 mg twice a day  Next appointment: 2/9 at 8:30

## 2024-06-24 ENCOUNTER — Ambulatory Visit: Admitting: Professional Counselor

## 2024-06-24 DIAGNOSIS — F203 Undifferentiated schizophrenia: Secondary | ICD-10-CM | POA: Diagnosis not present

## 2024-06-24 DIAGNOSIS — F330 Major depressive disorder, recurrent, mild: Secondary | ICD-10-CM

## 2024-06-24 NOTE — Progress Notes (Unsigned)
" °  THERAPIST PROGRESS NOTE  Session Time: 11:00 AM - 11:21 AM   Participation Level: Minimal  Behavioral Response: Well Groomed, Drowsy, Dysphoric  Type of Therapy: Individual Therapy  Treatment Goals addressed: Active OP Depression  LTG: I won't be depressed. I just feel down about myself. My weight. (Progressing)                Start:  12/03/23    Expected End:  12/01/24    Goal Note Reviewed 03/09/24 I feel I'm doing good with that goal.   STG: To reduce symptoms of depression AEB reduction in PHQ9 scores over the next 12 weeks. (Progressing)    STG: To increase use of positive coping mechanisms AEB self-report over the next 12 weeks (Progressing)  Goal Note Reviewed 03/09/24 I've been doing good with coping.   ProgressTowards Goals: Progressing  Interventions: Motivational Interviewing and Supportive  Summary: Karen Beard is a 54 y.o. female who presents with a history of depression and schizophrenia. She appeared somber but oriented x5. She stated she has been getting better since being sick. She wants to refocus on losing weight again. She is working on her diet. Vernie was falling asleep during session and reported she didn't sleep well the night before. Session was ended early.   Therapist Response: Conducted session with [name]. Began session with check-in/update since previous session. Utilized empathetic and reflective listening. Used open-ended questions to facilitate discussion and summarized Rashia's thoughts/feelings. Scheduled additional appointment and concluded session.   Suicidal/Homicidal: No  Plan: Return again in 4 weeks.  Diagnosis: Undifferentiated schizophrenia (HCC)  MDD (major depressive disorder), recurrent episode, mild  Collaboration of Care: Medication Management AEB chart review  Patient/Guardian was advised Release of Information must be obtained prior to any record release in order to collaborate their care with an outside  provider. Patient/Guardian was advised if they have not already done so to contact the registration department to sign all necessary forms in order for us  to release information regarding their care.   Consent: Patient/Guardian gives verbal consent for treatment and assignment of benefits for services provided during this visit. Patient/Guardian expressed understanding and agreed to proceed.   [name], [facility] 06/24/2024  "

## 2024-07-14 ENCOUNTER — Encounter: Payer: Self-pay | Admitting: Family Medicine

## 2024-07-22 ENCOUNTER — Ambulatory Visit: Admitting: Professional Counselor

## 2024-07-26 ENCOUNTER — Ambulatory Visit: Admitting: Psychiatry

## 2024-08-04 ENCOUNTER — Ambulatory Visit: Admitting: Professional Counselor
